# Patient Record
Sex: Female | Born: 1960
Health system: Southern US, Community
[De-identification: ages and names within clinical notes are randomized; demographics above are authoritative.]

## PROBLEM LIST (undated history)

## (undated) DIAGNOSIS — E785 Hyperlipidemia, unspecified: Secondary | ICD-10-CM

## (undated) DIAGNOSIS — Z720 Tobacco use: Secondary | ICD-10-CM

## (undated) DIAGNOSIS — I10 Essential (primary) hypertension: Secondary | ICD-10-CM

## (undated) DIAGNOSIS — M6530 Trigger finger, unspecified finger: Secondary | ICD-10-CM

## (undated) DIAGNOSIS — D649 Anemia, unspecified: Secondary | ICD-10-CM

## (undated) DIAGNOSIS — M1990 Unspecified osteoarthritis, unspecified site: Secondary | ICD-10-CM

## (undated) DIAGNOSIS — K219 Gastro-esophageal reflux disease without esophagitis: Secondary | ICD-10-CM

## (undated) DIAGNOSIS — E119 Type 2 diabetes mellitus without complications: Secondary | ICD-10-CM

## (undated) HISTORY — DX: Type 2 diabetes mellitus without complications: E11.9

## (undated) HISTORY — DX: Tobacco use: Z72.0

## (undated) HISTORY — DX: Anemia, unspecified: D64.9

## (undated) HISTORY — DX: Gastro-esophageal reflux disease without esophagitis: K21.9

## (undated) HISTORY — DX: Essential (primary) hypertension: I10

## (undated) HISTORY — DX: Hyperlipidemia, unspecified: E78.5

---

## 1993-05-14 HISTORY — PX: TUBAL LIGATION: SHX77

## 2001-05-14 HISTORY — PX: NASAL SINUS SURGERY: SHX719

## 2006-10-09 ENCOUNTER — Emergency Department: Payer: Self-pay | Admitting: Emergency Medicine

## 2006-10-10 ENCOUNTER — Emergency Department: Payer: Self-pay | Admitting: Emergency Medicine

## 2011-03-09 ENCOUNTER — Ambulatory Visit: Payer: Self-pay | Admitting: Internal Medicine

## 2011-03-30 ENCOUNTER — Ambulatory Visit (INDEPENDENT_AMBULATORY_CARE_PROVIDER_SITE_OTHER): Payer: BC Managed Care – PPO | Admitting: Internal Medicine

## 2011-03-30 ENCOUNTER — Encounter: Payer: Self-pay | Admitting: Internal Medicine

## 2011-03-30 VITALS — BP 132/70 | HR 90 | Temp 98.4°F | Resp 16 | Ht 64.0 in | Wt 235.2 lb

## 2011-03-30 DIAGNOSIS — Z79899 Other long term (current) drug therapy: Secondary | ICD-10-CM

## 2011-03-30 DIAGNOSIS — Z1211 Encounter for screening for malignant neoplasm of colon: Secondary | ICD-10-CM

## 2011-03-30 DIAGNOSIS — E785 Hyperlipidemia, unspecified: Secondary | ICD-10-CM

## 2011-03-30 DIAGNOSIS — E669 Obesity, unspecified: Secondary | ICD-10-CM

## 2011-03-30 DIAGNOSIS — Z716 Tobacco abuse counseling: Secondary | ICD-10-CM

## 2011-03-30 DIAGNOSIS — I10 Essential (primary) hypertension: Secondary | ICD-10-CM

## 2011-03-30 DIAGNOSIS — Z1239 Encounter for other screening for malignant neoplasm of breast: Secondary | ICD-10-CM

## 2011-03-30 DIAGNOSIS — Z720 Tobacco use: Secondary | ICD-10-CM | POA: Insufficient documentation

## 2011-03-30 DIAGNOSIS — Z7189 Other specified counseling: Secondary | ICD-10-CM

## 2011-03-30 LAB — BASIC METABOLIC PANEL
BUN: 18 mg/dL (ref 6–23)
CO2: 28 mEq/L (ref 19–32)
Calcium: 9.5 mg/dL (ref 8.4–10.5)
Creatinine, Ser: 0.7 mg/dL (ref 0.4–1.2)
Glucose, Bld: 170 mg/dL — ABNORMAL HIGH (ref 70–99)

## 2011-03-30 MED ORDER — LOSARTAN POTASSIUM-HCTZ 100-25 MG PO TABS: 1.0000 | ORAL_TABLET | Freq: Every day | ORAL | Status: DC

## 2011-04-01 ENCOUNTER — Encounter: Payer: Self-pay | Admitting: Internal Medicine

## 2011-04-01 DIAGNOSIS — K219 Gastro-esophageal reflux disease without esophagitis: Secondary | ICD-10-CM | POA: Insufficient documentation

## 2011-04-01 DIAGNOSIS — E785 Hyperlipidemia, unspecified: Secondary | ICD-10-CM | POA: Insufficient documentation

## 2011-04-01 DIAGNOSIS — Z716 Tobacco abuse counseling: Secondary | ICD-10-CM | POA: Insufficient documentation

## 2011-04-01 DIAGNOSIS — I10 Essential (primary) hypertension: Secondary | ICD-10-CM | POA: Insufficient documentation

## 2011-04-01 DIAGNOSIS — E1169 Type 2 diabetes mellitus with other specified complication: Secondary | ICD-10-CM | POA: Insufficient documentation

## 2011-04-01 MED ORDER — PRAVASTATIN SODIUM 40 MG PO TABS: 40.0000 mg | ORAL_TABLET | Freq: Every evening | ORAL | Status: DC

## 2011-04-01 NOTE — Assessment & Plan Note (Signed)
Discussed again,.  She has lost a brother to lung Ca.  She does not want to use pharmacotherapy at this point. Suggested the activity of drinking tea given the availabiltiy of so mnay opitons and the calming aspects of brewing and steeping tea.

## 2011-04-01 NOTE — Assessment & Plan Note (Signed)
Controlled on losartan/hct.  No changes today.  BMET due

## 2011-04-01 NOTE — Assessment & Plan Note (Addendum)
Her last LDL was 157 in April 2012. She has never had statin therapy.  If repeat LDL remains > 100 will advise statin therapy due to multiple CRFs

## 2011-04-01 NOTE — Patient Instructions (Signed)
Continue your dietar and try to add 20 minutes of exercise several times a week to help your weight loss.  If you have trouble quitting smoking without medications, return for dicussion of chantix and zyban.  We will consider statin therapy if your LDL is > 100

## 2011-04-01 NOTE — Progress Notes (Signed)
Subjective:    Patient ID: Katelyn Proctor, female    DOB: Jul 15, 1960, 50 y.o.   MRN: [id number]  HPI   Katelyn Proctor is a 50 yo AA female with a history of hypertension, hyperlipdemia, tobacco abuse and obesity who is here to followup on chronic medical issues and to discuss screening colonoscopy.  She has to date avoided screening but is now willing to undergo procedure at her younger sister's urging. She has some time off in December and would like to have it done then.  She ha no FH of colon CA, no recent change in bowel habits and no history of blood in stools.    She is compliant with medications but does not exercise regularly.  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  She has modified her diet and has lost a samll amount of weight since her obesity was addressed last April in office visit.   She continues to smoke < 1 pack daily and understands the need to stop her tobacco use but has not made a concerted effort to quit and is wondering what she will substitute for the activity to avoid the weight gain. Past Medical History  Diagnosis Date  . Hypertension   . Chronic sinusitis   . GERD (gastroesophageal reflux disease)   . Anemia   . Tobacco abuse   . Hyperlipidemia     No current outpatient prescriptions on file prior to visit.    Review of Systems  Constitutional: Negative for fever, chills and unexpected weight change.  HENT: Negative for hearing loss, ear pain, nosebleeds, congestion, sore throat, facial swelling, rhinorrhea, sneezing, mouth sores, trouble swallowing, neck pain, neck stiffness, voice change, postnasal drip, sinus pressure, tinnitus and ear discharge.   Eyes: Negative for pain, discharge, redness and visual disturbance.  Respiratory: Negative for cough, chest tightness, shortness of breath, wheezing and stridor.   Cardiovascular: Negative for chest pain, palpitations and leg swelling.  Musculoskeletal: Negative for  myalgias and arthralgias.  Skin: Negative for color change and rash.  Neurological: Negative for dizziness, weakness, light-headedness and headaches.  Hematological: Negative for adenopathy.   BP 132/70  Pulse 90  Temp(Src) 98.4 F (36.9 C) (Oral)  Resp 16  Ht 5\' 4"  (1.626 m)  Wt 235 lb 4 oz (106.709 kg)  BMI 40.38 kg/m2  SpO2 95%     Objective:   Physical Exam  Constitutional: She is oriented to person, place, and time. She appears well-developed and well-nourished.  HENT:  Mouth/Throat: Oropharynx is clear and moist.  Eyes: EOM are normal. Pupils are equal, round, and reactive to light. No scleral icterus.  Neck: Normal range of motion. Neck supple. No JVD present. No thyromegaly present.  Cardiovascular: Normal rate, regular rhythm, normal heart sounds and intact distal pulses.   Pulmonary/Chest: Effort normal and breath sounds normal.  Abdominal: Soft. Bowel sounds are normal. She exhibits no mass. There is no tenderness.  Musculoskeletal: Normal range of motion. She exhibits no edema.  Lymphadenopathy:    She has no cervical adenopathy.  Neurological: She is alert and oriented to person, place, and time.  Skin: Skin is warm and dry.  Psychiatric: She has a normal mood and affect.       Assessment & Plan:  Screening for colon CA:  will try to accomodate her short timeframe but I advised her that it is difficutl to have this accomplished in less than 3 month period. Refer to  [facility], or [facility], first available, since she is requesting [place] location.  Obesity:  Congratulated her on continued weight loss and made recommendations on diet and exercise.

## 2011-04-02 ENCOUNTER — Other Ambulatory Visit: Payer: Self-pay | Admitting: *Deleted

## 2011-04-02 NOTE — Telephone Encounter (Signed)
Opened in error

## 2011-04-26 ENCOUNTER — Other Ambulatory Visit: Payer: BC Managed Care – PPO | Admitting: Internal Medicine

## 2011-06-19 ENCOUNTER — Encounter: Payer: Self-pay | Admitting: Internal Medicine

## 2011-08-06 ENCOUNTER — Ambulatory Visit: Payer: BC Managed Care – PPO | Admitting: Internal Medicine

## 2011-08-07 ENCOUNTER — Ambulatory Visit (INDEPENDENT_AMBULATORY_CARE_PROVIDER_SITE_OTHER): Payer: BC Managed Care – PPO | Admitting: Internal Medicine

## 2011-08-07 ENCOUNTER — Encounter: Payer: Self-pay | Admitting: Internal Medicine

## 2011-08-07 VITALS — BP 142/80 | HR 92 | Temp 98.4°F | Resp 16 | Ht 64.0 in | Wt 239.0 lb

## 2011-08-07 DIAGNOSIS — Z7189 Other specified counseling: Secondary | ICD-10-CM

## 2011-08-07 DIAGNOSIS — Z1239 Encounter for other screening for malignant neoplasm of breast: Secondary | ICD-10-CM

## 2011-08-07 DIAGNOSIS — E785 Hyperlipidemia, unspecified: Secondary | ICD-10-CM

## 2011-08-07 DIAGNOSIS — I10 Essential (primary) hypertension: Secondary | ICD-10-CM

## 2011-08-07 DIAGNOSIS — Z720 Tobacco use: Secondary | ICD-10-CM

## 2011-08-07 DIAGNOSIS — F17200 Nicotine dependence, unspecified, uncomplicated: Secondary | ICD-10-CM

## 2011-08-07 DIAGNOSIS — E669 Obesity, unspecified: Secondary | ICD-10-CM

## 2011-08-07 DIAGNOSIS — Z716 Tobacco abuse counseling: Secondary | ICD-10-CM

## 2011-08-07 MED ORDER — AMLODIPINE BESYLATE 2.5 MG PO TABS: 2.5000 mg | ORAL_TABLET | Freq: Every day | ORAL | Status: DC

## 2011-08-07 NOTE — Assessment & Plan Note (Signed)
I have addressed  BMI and recommended a low glycemic index diet utilizing smaller more frequent meals to increase metabolism.  I have also recommended that patient start exercising with a goal of 30 minutes of aerobic exercise a minimum of 5 days per week. Screening for lipid disorders, thyroid and diabetes to be done today.   

## 2011-08-07 NOTE — Patient Instructions (Addendum)
Consider the Low Glycemic Index Diet and 6 smaller meals daily :   7 AM Low carbohydrate Protein  Shakes (EAS Carb Control  Or Atkins ,  Available everywhere,   In  cases at [employer]Js )  2.5 carbs  (Add or substitute a toasted sandwhich thin w/ peanut butter)  10 AM: Protein bar by Atkins (snack size,  Chocolate lover's variety at  [employer]J's)    Lunch: sandwich on pita bread or flatbread (Joseph's makes a pita bread and a flat bread , available at [employer] and [employer]J's; Toufayah makes a low carb flatbread available at [employer] and [employer])   3 PM:  Mid day :  Another protein bar,  Or a  cheese stick, 1/4 cup of almonds, walnuts, pistachios, pecans, peanuts,  Macadamia nuts  6 PM  Dinner:  "mean and green:"  Meat/chicken/fish, salad, and green veggie : use ranch, vinagrette,  Blue cheese, etc  9 PM snack : Breyer's low carb fudgiscle or  ice cream bar (Carb Smart) Weight Watcher's ice cream bar , or another protein shake  Return for 8 hr fasting labs when you can  Walk for 20 minutes 5 days per week  Adding amlodipine 2.5 mg daily for blood pressure control

## 2011-08-07 NOTE — Progress Notes (Signed)
Patient ID: Katelyn Proctor, female   DOB: 04-11-1961, 51 y.o.   MRN: [id number]  Patient Active Problem List  Diagnoses  . Tobacco abuse  . Hyperlipidemia  . Tobacco abuse counseling  . Hypertension  . GERD (gastroesophageal reflux disease)  . Obesity    Subjective:  CC:   Chief Complaint  Patient presents with  . Follow-up    HPI:   Katelyn Proctor a 51 y.o. female who   Presents for six month follow up on hypertension, hyperlpiidemia and obesity.  She was referred for colonoscopy at her last visit but this has not been done yet because she has had difficulty arranging care of her elderly un.  cle during her absence.  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 She has no new complaints today. She has been unable to lose weight despite counseling. She has reduced her tobacco consumption but has not quit yet.   6 month follow up on hypertension, hyperlpiidemia and obesity.  She was referred for colonoscopy at her last visit but this has not been done yet because she has had difficulty arranging care of her elderly un.  cle during her absence.  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 She has no new complaints today. She has been unable to lose weight despite counseling. She has reduced her tobacco consumption but has not quit yet.     Past Medical History  Diagnosis Date  . Chronic sinusitis   . Anemia   . Tobacco abuse   . Hyperlipidemia   . Hypertension   . GERD (gastroesophageal reflux disease)     Past Surgical History  Procedure Date  . Cesarean section   . Tubal ligation 1995  . Nasal sinus surgery 2003         The following portions of the patient's history were reviewed and updated as appropriate: Allergies, current medications, and problem list.    Review of Systems:   12 Pt  review of systems was negative except those addressed in the HPI,     History   Social History  . Marital Status: Single    Spouse  Name: N/A    Number of Children: N/A  . Years of Education: N/A   Occupational History  . Not on file.   Social History Main Topics  . Smoking status: Current Everyday Smoker -- 0.5 packs/day    Types: Cigarettes  . Smokeless tobacco: Never Used  . Alcohol Use: No  . Drug Use: No  . Sexually Active: Not on file   Other Topics Concern  . Not on file   Social History Narrative  . No narrative on file    Objective:  BP 142/80  Pulse 92  Temp(Src) 98.4 F (36.9 C) (Oral)  Resp 16  Ht 5\' 4"  (1.626 m)  Wt 239 lb (108.41 kg)  BMI 41.02 kg/m2  SpO2 97%  General appearance: alert, cooperative and appears stated age Ears: normal TM's and external ear canals both ears Throat: lips, mucosa, and tongue normal; teeth and gums normal Neck: no adenopathy, no carotid bruit, supple, symmetrical, trachea midline and thyroid not enlarged, symmetric, no tenderness/mass/nodules Back: symmetric, no curvature. ROM normal. No CVA tenderness. Lungs: clear to auscultation bilaterally Heart: regular rate and rhythm, S1, S2 normal, no murmur, click, rub or gallop Abdomen: soft, non-tender; bowel sounds normal; no masses,  no organomegaly Pulses: 2+ and symmetric Skin: Skin color, texture, turgor normal. No rashes  or lesions Lymph nodes: Cervical, supraclavicular, and axillary nodes normal.  Assessment and Plan:  Tobacco abuse counseling Has reduced cigs to 7 /  Daily with a smoke coach.  Can wait until the next day .  Obesity I have addressed  BMI and recommended a low glycemic index diet utilizing smaller more frequent meals to increase metabolism.  I have also recommended that patient start exercising with a goal of 30 minutes of aerobic exercise a minimum of 5 days per week. Screening for lipid disorders, thyroid and diabetes to be done today.    Tobacco abuse Counseling has been given on prior occasions and again today. She has reduced her consumption to half a pack a day. She is not  interested in pharmacotherapy at this time.  Hyperlipidemia Managed with pravastatin for goal LDL  100 or less  Hypertension Not well controlled on losartan/HCTZ alone. And adding 2.5 mg of amlodipine today.  She had normal renal function via November 2012 labs and will return for repeat labs at her convenience.    Updated Medication List Outpatient Encounter Prescriptions as of 08/07/2011  Medication Sig Dispense Refill  . losartan-hydrochlorothiazide (HYZAAR) 100-25 MG per tablet Take 1 tablet by mouth daily.  30 tablet  6  . pravastatin (PRAVACHOL) 40 MG tablet Take 1 tablet (40 mg total) by mouth every evening.  30 tablet  11  . Psyllium (METAMUCIL PO) Take by mouth.        Marland Kitchen amLODipine (NORVASC) 2.5 MG tablet Take 1 tablet (2.5 mg total) by mouth daily.  90 tablet  3  . DISCONTD: Ferrous Sulfate (IRON) 28 MG TABS Take by mouth.           Orders Placed This Encounter  Procedures  . TSH  . Lipid panel  . COMPLETE METABOLIC PANEL WITH GFR    No Follow-up on file.

## 2011-08-07 NOTE — Assessment & Plan Note (Signed)
Has reduced cigs to 7 /  Daily with a smoke coach.  Can wait until the next day .

## 2011-08-10 ENCOUNTER — Ambulatory Visit: Payer: BC Managed Care – PPO | Admitting: Internal Medicine

## 2011-08-11 ENCOUNTER — Encounter: Payer: Self-pay | Admitting: Internal Medicine

## 2011-08-11 NOTE — Assessment & Plan Note (Signed)
Managed with pravastatin for goal LDL  100 or less

## 2011-08-11 NOTE — Assessment & Plan Note (Signed)
Counseling has been given on prior occasions and again today. She has reduced her consumption to half a pack a day. She is not interested in pharmacotherapy at this time.

## 2011-08-11 NOTE — Assessment & Plan Note (Addendum)
Not well controlled on losartan/HCTZ alone. And adding 2.5 mg of amlodipine today.  She had normal renal function via November 2012 labs and will return for repeat labs at her convenience.

## 2011-08-17 ENCOUNTER — Other Ambulatory Visit (INDEPENDENT_AMBULATORY_CARE_PROVIDER_SITE_OTHER): Payer: BC Managed Care – PPO

## 2011-08-17 DIAGNOSIS — E785 Hyperlipidemia, unspecified: Secondary | ICD-10-CM

## 2011-08-17 LAB — LIPID PANEL
Cholesterol: 207 mg/dL — ABNORMAL HIGH (ref 0–200)
Triglycerides: 70 mg/dL (ref 0.0–149.0)

## 2011-08-22 ENCOUNTER — Ambulatory Visit: Payer: BC Managed Care – PPO | Admitting: Internal Medicine

## 2011-08-22 ENCOUNTER — Encounter: Payer: Self-pay | Admitting: Internal Medicine

## 2011-08-22 ENCOUNTER — Other Ambulatory Visit: Payer: Self-pay | Admitting: *Deleted

## 2011-08-22 MED ORDER — ATORVASTATIN CALCIUM 20 MG PO TABS: 20.0000 mg | ORAL_TABLET | Freq: Every day | ORAL | Status: DC

## 2011-09-19 ENCOUNTER — Encounter: Payer: Self-pay | Admitting: Internal Medicine

## 2011-10-17 ENCOUNTER — Encounter: Payer: Self-pay | Admitting: Internal Medicine

## 2011-10-17 ENCOUNTER — Telehealth: Payer: Self-pay | Admitting: Internal Medicine

## 2011-10-17 ENCOUNTER — Ambulatory Visit (INDEPENDENT_AMBULATORY_CARE_PROVIDER_SITE_OTHER): Payer: BC Managed Care – PPO | Admitting: Internal Medicine

## 2011-10-17 VITALS — BP 110/70 | HR 78 | Temp 97.8°F | Resp 16 | Wt 235.8 lb

## 2011-10-17 DIAGNOSIS — R928 Other abnormal and inconclusive findings on diagnostic imaging of breast: Secondary | ICD-10-CM

## 2011-10-17 DIAGNOSIS — E669 Obesity, unspecified: Secondary | ICD-10-CM

## 2011-10-17 DIAGNOSIS — J309 Allergic rhinitis, unspecified: Secondary | ICD-10-CM

## 2011-10-17 DIAGNOSIS — I10 Essential (primary) hypertension: Secondary | ICD-10-CM

## 2011-10-17 MED ORDER — PREDNISONE (PAK) 10 MG PO TABS: ORAL_TABLET | ORAL | Status: AC

## 2011-10-17 MED ORDER — SULFAMETHOXAZOLE-TRIMETHOPRIM 800-160 MG PO TABS: 1.0000 | ORAL_TABLET | Freq: Two times a day (BID) | ORAL | Status: AC

## 2011-10-17 NOTE — Progress Notes (Signed)
Patient ID: Katelyn Proctor, female   DOB: 30-Aug-1960, 51 y.o.   MRN: [id number]  Patient Active Problem List  Diagnoses  . Tobacco abuse  . Hyperlipidemia  . Tobacco abuse counseling  . Hypertension  . GERD (gastroesophageal reflux disease)  . Obesity  . Follow-up examination of abnormal mammogram  . Allergic rhinitis    Subjective:  CC:   Chief Complaint  Patient presents with  . Follow-up    on breast ultrasound    HPI:   Katelyn Proctor a 51 y.o. female who presents for follow up on abnormal mammogram, followed by Left axillary u/s sound several enlarged LNs.  1 month followup rec'd.  She has been having sinus congestion accompanied by ear fullness and sneezing  since early May when she started a walking program.  She has also has some mild cystic changes under both axilla c/w hidrenatis suppurativa.     Past Medical History  Diagnosis Date  . Chronic sinusitis   . Anemia   . Tobacco abuse   . Hyperlipidemia   . Hypertension   . GERD (gastroesophageal reflux disease)     Past Surgical History  Procedure Date  . Cesarean section   . Tubal ligation 1995  . Nasal sinus surgery 2003         The following portions of the patient's history were reviewed and updated as appropriate: Allergies, current medications, and problem list.    Review of Systems:   12 Pt  review of systems was negative except those addressed in the HPI,     History   Social History  . Marital Status: Single    Spouse Name: N/A    Number of Children: N/A  . Years of Education: N/A   Occupational History  . Not on file.   Social History Main Topics  . Smoking status: Current Everyday Smoker -- 0.5 packs/day    Types: Cigarettes  . Smokeless tobacco: Never Used  . Alcohol Use: No  . Drug Use: No  . Sexually Active: Not on file   Other Topics Concern  . Not on file   Social History Narrative  . No narrative on file    Objective:  BP 110/70  Pulse 78  Temp(Src) 97.8  F (36.6 C) (Oral)  Resp 16  Wt 235 lb 12 oz (106.935 kg)  SpO2 97%  General appearance: alert, cooperative and appears stated age Ears: normal TM's and external ear canals both ears Throat: lips, mucosa, and tongue normal; teeth and gums normal Neck: no adenopathy, no carotid bruit, supple, symmetrical, trachea midline and thyroid not enlarged, symmetric, no tenderness/mass/nodules Back: symmetric, no curvature. ROM normal. No CVA tenderness. Axilla: left side with subcutaneous nodule, nonfluctuant, mobile  Lungs: clear to auscultation bilaterally Heart: regular rate and rhythm, S1, S2 normal, no murmur, click, rub or gallop Abdomen: soft, non-tender; bowel sounds normal; no masses,  no organomegaly Pulses: 2+ and symmetric Skin: Skin color, texture, turgor normal. No rashes or lesions Lymph nodes: Cervical, supraclavicular, and axillary nodes normal.  Assessment and Plan:  Hypertension imporved contorll with addition of amlodipine 2.5 mg ,  dialy ,  No changes today  Follow-up examination of abnormal mammogram She has axillary LAD on the left in the area of a small  cyst c/w hidradenitis suppurativa, as well as uncontrolled allergic rhinitis.  will treat both and repeat u/s  In one month    Allergic rhinitis No signs of sinusitis on exam,.  Will treat with steroid taper and  antihistamine going forward.     Updated Medication List Outpatient Encounter Prescriptions as of 10/17/2011  Medication Sig Dispense Refill  . amLODipine (NORVASC) 2.5 MG tablet Take 1 tablet (2.5 mg total) by mouth daily.  90 tablet  3  . losartan-hydrochlorothiazide (HYZAAR) 100-25 MG per tablet Take 1 tablet by mouth daily.  30 tablet  6  . pravastatin (PRAVACHOL) 40 MG tablet Take 1 tablet (40 mg total) by mouth every evening.  30 tablet  11  . predniSONE (STERAPRED UNI-PAK) 10 MG tablet 6 tablets on Day 1 , then reduce by 1 tablet daily until gone  21 tablet  0  . sulfamethoxazole-trimethoprim (SEPTRA  DS) 800-160 MG per tablet Take 1 tablet by mouth 2 (two) times daily.  14 tablet  0  . DISCONTD: atorvastatin (LIPITOR) 20 MG tablet Take 1 tablet (20 mg total) by mouth daily.  30 tablet  3  . DISCONTD: Psyllium (METAMUCIL PO) Take by mouth.           No orders of the defined types were placed in this encounter.    No Follow-up on file.

## 2011-10-17 NOTE — Assessment & Plan Note (Signed)
imporved contorll with addition of amlodipine 2.5 mg ,  dialy ,  No changes today

## 2011-10-17 NOTE — Patient Instructions (Addendum)
I am goingn to treat you for a skin infection and for severe allergies and then have your ultrasound repeated on your left axilla in 4 weeks.   Steroid taper for get the allergies under control:  Prednisone taper over 6 days  Take either cetirizine (zyrtec) 10 mg daily  Or allegra (fexofenadine ) 180  Mg daily for allergy symptoms.   Continue using the saline to flush your sinuses.  Septra DS one tablet twice daily for one week for the skin infection

## 2011-10-17 NOTE — Telephone Encounter (Signed)
Ultra sound repeated for left axilla. Scheduled for 7.12.13 @ 3:15 [facility].Patient aware./vfw

## 2011-10-21 ENCOUNTER — Encounter: Payer: Self-pay | Admitting: Internal Medicine

## 2011-10-21 DIAGNOSIS — J309 Allergic rhinitis, unspecified: Secondary | ICD-10-CM | POA: Insufficient documentation

## 2011-10-21 NOTE — Assessment & Plan Note (Signed)
No signs of sinusitis on exam,.  Will treat with steroid taper and antihistamine going forward.

## 2011-10-21 NOTE — Assessment & Plan Note (Signed)
She has axillary LAD on the left in the area of a small  cyst c/w hidradenitis suppurativa, as well as uncontrolled allergic rhinitis.  will treat both and repeat u/s  In one month

## 2011-10-23 ENCOUNTER — Encounter: Payer: Self-pay | Admitting: Internal Medicine

## 2011-11-18 ENCOUNTER — Other Ambulatory Visit: Payer: Self-pay | Admitting: Internal Medicine

## 2011-12-04 ENCOUNTER — Encounter: Payer: Self-pay | Admitting: Internal Medicine

## 2011-12-24 ENCOUNTER — Encounter: Payer: Self-pay | Admitting: Internal Medicine

## 2012-03-20 ENCOUNTER — Encounter: Payer: Self-pay | Admitting: Internal Medicine

## 2012-04-07 ENCOUNTER — Other Ambulatory Visit: Payer: Self-pay

## 2012-04-07 MED ORDER — PRAVASTATIN SODIUM 40 MG PO TABS: 40.0000 mg | ORAL_TABLET | Freq: Every evening | ORAL | Status: DC

## 2012-04-07 NOTE — Telephone Encounter (Signed)
Refill request for Pravastatin 40 mg# 30 1 R sent electronic to [facility]. Advised patient appt needed for further refills?

## 2012-07-15 ENCOUNTER — Other Ambulatory Visit: Payer: Self-pay | Admitting: Internal Medicine

## 2012-07-16 ENCOUNTER — Telehealth: Payer: Self-pay | Admitting: Internal Medicine

## 2012-07-16 DIAGNOSIS — E785 Hyperlipidemia, unspecified: Secondary | ICD-10-CM

## 2012-07-16 DIAGNOSIS — Z79899 Other long term (current) drug therapy: Secondary | ICD-10-CM

## 2012-07-16 MED ORDER — LOSARTAN POTASSIUM-HCTZ 100-25 MG PO TABS: ORAL_TABLET | ORAL | Status: DC

## 2012-07-16 NOTE — Telephone Encounter (Signed)
Pt notified and BP meds filled for one month until labs are completed. Please advise on the muscle pain.

## 2012-07-16 NOTE — Telephone Encounter (Signed)
Please inform this patient that the reason her bp med is not being refilled is because she has not had her kidney function or electrolytes done in over a year.

## 2012-07-16 NOTE — Telephone Encounter (Signed)
Med filled for 1 month until labs are completed.

## 2012-07-16 NOTE — Telephone Encounter (Signed)
Patient notified as instructed by telephone. Lab appointment scheduled. 

## 2012-07-16 NOTE — Addendum Note (Signed)
Addended by: Jackson Latino on: 07/16/2012 12:07 PM   Modules accepted: Orders

## 2012-07-16 NOTE — Telephone Encounter (Signed)
She needs to stop the medication for cholesterol and get her labs drawn as soon as possible  so I can check to see whether it had bothered her liver

## 2012-07-16 NOTE — Telephone Encounter (Signed)
Pt made an appt for 3/21 to get these labs completed. Please advise.

## 2012-07-16 NOTE — Telephone Encounter (Signed)
Pt states she has been out of her BP meds x 2 days.  Pt states the cholesterol med she is taking is giving her muscle pain in her arms and is wondering if anything can be done to help that.  Pt did make an appt for 3/21 with Dr. Darrick Huntsman but needs help with her medications before then.  Please advise.

## 2012-07-16 NOTE — Telephone Encounter (Signed)
Please inform this patient that the reason her bp med is not being refilled is because she has not had her kidney function or electrolytes done in over a year.   

## 2012-07-21 ENCOUNTER — Other Ambulatory Visit (INDEPENDENT_AMBULATORY_CARE_PROVIDER_SITE_OTHER): Payer: BC Managed Care – PPO

## 2012-07-21 DIAGNOSIS — Z79899 Other long term (current) drug therapy: Secondary | ICD-10-CM

## 2012-07-21 DIAGNOSIS — E785 Hyperlipidemia, unspecified: Secondary | ICD-10-CM

## 2012-07-21 LAB — COMPREHENSIVE METABOLIC PANEL
CO2: 25 mEq/L (ref 19–32)
GFR: 137.14 mL/min (ref >60.00)
Glucose, Bld: 176 mg/dL — ABNORMAL HIGH (ref 70–99)
Sodium: 139 mEq/L (ref 135–145)
Total Bilirubin: 0.7 mg/dL (ref 0.3–1.2)
Total Protein: 6.4 g/dL (ref 6.0–8.3)

## 2012-07-21 LAB — LIPID PANEL: HDL: 45.2 mg/dL (ref >39.00)

## 2012-07-21 LAB — CK: Total CK: 147 U/L (ref 7–177)

## 2012-08-01 ENCOUNTER — Encounter: Payer: Self-pay | Admitting: Internal Medicine

## 2012-08-01 ENCOUNTER — Ambulatory Visit (INDEPENDENT_AMBULATORY_CARE_PROVIDER_SITE_OTHER): Payer: BC Managed Care – PPO | Admitting: Internal Medicine

## 2012-08-01 VITALS — BP 120/86 | HR 84 | Temp 97.9°F | Resp 16 | Wt 232.0 lb

## 2012-08-01 DIAGNOSIS — I10 Essential (primary) hypertension: Secondary | ICD-10-CM

## 2012-08-01 DIAGNOSIS — E669 Obesity, unspecified: Secondary | ICD-10-CM

## 2012-08-01 DIAGNOSIS — B958 Unspecified staphylococcus as the cause of diseases classified elsewhere: Secondary | ICD-10-CM

## 2012-08-01 DIAGNOSIS — E119 Type 2 diabetes mellitus without complications: Secondary | ICD-10-CM

## 2012-08-01 DIAGNOSIS — F17200 Nicotine dependence, unspecified, uncomplicated: Secondary | ICD-10-CM

## 2012-08-01 DIAGNOSIS — R739 Hyperglycemia, unspecified: Secondary | ICD-10-CM

## 2012-08-01 DIAGNOSIS — E1159 Type 2 diabetes mellitus with other circulatory complications: Secondary | ICD-10-CM | POA: Insufficient documentation

## 2012-08-01 DIAGNOSIS — R7309 Other abnormal glucose: Secondary | ICD-10-CM

## 2012-08-01 DIAGNOSIS — E785 Hyperlipidemia, unspecified: Secondary | ICD-10-CM

## 2012-08-01 DIAGNOSIS — E1169 Type 2 diabetes mellitus with other specified complication: Secondary | ICD-10-CM

## 2012-08-01 DIAGNOSIS — L089 Local infection of the skin and subcutaneous tissue, unspecified: Secondary | ICD-10-CM

## 2012-08-01 DIAGNOSIS — Z720 Tobacco use: Secondary | ICD-10-CM

## 2012-08-01 LAB — HEMOGLOBIN A1C: Hgb A1c MFr Bld: 9.8 % — ABNORMAL HIGH (ref 4.6–6.5)

## 2012-08-01 LAB — TSH: TSH: 1.03 u[IU]/mL (ref 0.35–5.50)

## 2012-08-01 MED ORDER — SULFAMETHOXAZOLE-TRIMETHOPRIM 800-160 MG PO TABS: 1.0000 | ORAL_TABLET | Freq: Two times a day (BID) | ORAL | Status: DC

## 2012-08-01 NOTE — Patient Instructions (Addendum)
You have adult onset diabetes,  We are checking hba1c today  I would like you to lose   25 lbs,  Or 10%  Of your current body weight over the next 6 months  With diet and exercise   This is  One version of a  "Low GI"  Diet:  It will still lower your blood sugars and allow you to lose 4 to 8  lbs  per month if you follow it carefully and combine it with 30 minutes of aerobic exercise 5 days per week .   All of the foods can be found at grocery stores and in bulk at [employer].  The Atkins protein bars and shakes are available in more varieties at [employer], [employer] and [employer].     7 AM Breakfast:  Choose from the following:  Low carbohydrate Protein  Shakes (I recommend the EAS AdvantEdge "Carb Control" shakes  Or the low carb shakes by Atkins.    2.5 carbs   Arnold's "Sandwhich Thin"toasted  w/ peanut butter (no jelly: about 20 net carbs  "Bagel Thin" with cream cheese and salmon: about 20 carbs   a scrambled egg/bacon/cheese burrito made with Mission's "carb balance" whole wheat tortilla  (about 10 net carbs )   Avoid cereal and bananas, oatmeal and cream of wheat and grits. They are loaded with carbohydrates!   10 AM: high protein snack  Protein bar by Atkins (the snack size, under 200 cal, usually < 6 net carbs).    A stick of cheese:  Around 1 carb,  100 cal     Dannon Light n Fit [place] Yogurt  (80 cal, 8 carbs)  Other so called "protein bars" and Greek yogurts tend to be loaded with carbohydrates.  Remember, in food advertising, the word "energy" is synonymous for " carbohydrate."  Lunch:   A Sandwich using the bread choices listed, Can use any  Eggs,  lunchmeat, grilled meat or canned tuna), avocado, regular mayo/mustard  and cheese.  A Salad using clue cheese, ranch,  Goddess or vinagrette,  No croutons or "confetti" and no "candied nuts" but regular nuts OK.   No pretzels or chips.  Pickles and miniature sweet peppers are a good low carb alternative  The bread is the only  source or carbohydrate that can be decreased (Joseph's makes a pita bread and a flat bread that are 50 cal and 4 net carbs ; Toufayan makes a low carb flatbread that's 100 cal and 9 net carbs  and  Mission's carb balance whole wheat tortilla  That is 210 cal and 6 net carbs) Avoid "Low fat dressings, as well as Reyne Dumas and [address] dressings They are loaded with sugar!   3 PM/ Mid day  Snack:  Consider  1 ounce of  almonds, walnuts, pistachios, pecans, peanuts,  Macadamia nuts or a nut medley.  Avoid "granola"; the dried cranberries and raisins are loaded with carbohydrates. Mixed nuts ok if no raisins or cranberries or dried fruit.     6 PM  Dinner:    "mean and green, "  Meat/chicken/fish with a green salad, and broccoli, cauliflower, green beans, spinach, brussel sprouts or  Lima beans::       There is a low carb pasta by Dreamfield's available at [employer] that is acceptable and tastes great only 5 diestible carbs/serving.   Try Michel Angelo's chicken piccata or chicken or eggplant parm over low carb pasta.   Clifton Custard Sanchez's "Carnitas" (pulled pork,  no sauce,  0 carbs) or his beef pot roast to make a dinner burrito  Whole wheat pasta is still full of digestible carbs and  Not as low in glycemic index as Dreamfield's.   Brown rice is still rice,  So skip the rice and noodles if you eat [place] or [place]  9 PM snack :   Breyer's "low carb" fudgsicle or  ice cream bar (Carb Smart line), or  Weight Watcher's ice cream bar , or another "no sugar added" ice cream;  a serving of fresh berries/cherries with whipped cream   Cheese or yoguty  Avoid bananas, pineapple, grapes  and watermelon on a regular basis because they are high in sugar)   Remember that snack Substitutions should be less than 10 carbs per serving and meals < 20 carbs. Remember to subtract fiber grams to get the "net carbs."

## 2012-08-01 NOTE — Assessment & Plan Note (Addendum)
She did not tolerate pravastatin use secondary to myalgias. Her untreated LDL is 170. We discussed giving her 3 months to lower her cholesterol with diet and exercise and if still elevated consider we will 100 at next visit we will consider trying low dose Lipitor or Crestor. She has been advised to start taking a baby aspirin daily.

## 2012-08-01 NOTE — Progress Notes (Signed)
Patient ID: Katelyn Proctor, female   DOB: 1960-06-21, 52 y.o.   MRN: [id number]   Patient Active Problem List  Diagnosis  . Tobacco abuse  . Hyperlipidemia  . Tobacco abuse counseling  . Hypertension  . GERD (gastroesophageal reflux disease)  . Obesity  . Follow-up examination of abnormal mammogram  . Allergic rhinitis  . Staphylococcal infection of skin  . Diabetes mellitus type 2 in obese    Subjective:  CC:   Chief Complaint  Patient presents with  . BP issues  . Cholesterol    Medication and labs    HPI:   Katelyn Proctor a 52 y.o. female who presents for Nine-month followup. Patient is overdue by 3 months for followup on hypertension, obesity, tobacco abuse and hyperlipidemia. She has had fasting labs done prior to this visit which revealed that she has  now also developed diabetes mellitus type 2.  She is not tolerating a pravastatin started at her last visit due to persistent myalgias and has stopped it. She has changed her diet a little in an effort to lose weight and is improving (more vegetables. However she is not exercising on a regular basis. Recently she developed a muscle cramp in her left posterior thigh that lasted for a full day but has now resolved. There is no history of recent travel immobilization or and no history of blood clots. She also has a new blistering lesion in her left inguinal area has been present for several days. She does not know how it began. She does not shave in that area.  Regarding her new onset diabetes her fasting glucose was 174 on her blood work. A1c is going to be drawn today. Several family members who have type 2 diabetes and is very disturbed by this news. She is very motivated to address this diagnosis with diet and exercise    Past Medical History  Diagnosis Date  . Chronic sinusitis   . Anemia   . Tobacco abuse   . Hyperlipidemia   . Hypertension   . GERD (gastroesophageal reflux disease)     Past Surgical History   Procedure Laterality Date  . Cesarean section    . Tubal ligation  1995  . Nasal sinus surgery  2003       The following portions of the patient's history were reviewed and updated as appropriate: Allergies, current medications, and problem list.    Review of Systems:  Patient denies headache, fevers, malaise, unintentional weight loss, skin rash, eye pain, sinus congestion and sinus pain, sore throat, dysphagia,  hemoptysis , cough, dyspnea, wheezing, chest pain, palpitations, orthopnea, edema, abdominal pain, nausea, melena, diarrhea, constipation, flank pain, dysuria, hematuria, urinary  Frequency, nocturia, numbness, tingling, seizures,  Focal weakness, Loss of consciousness,  Tremor, insomnia, depression, anxiety, and suicidal ideation.      History   Social History  . Marital Status: Single    Spouse Name: N/A    Number of Children: N/A  . Years of Education: N/A   Occupational History  . Not on file.   Social History Main Topics  . Smoking status: Current Every Day Smoker -- 0.50 packs/day    Types: Cigarettes  . Smokeless tobacco: Never Used  . Alcohol Use: No  . Drug Use: No  . Sexually Active: Not on file   Other Topics Concern  . Not on file   Social History Narrative  . No narrative on file    Objective:  BP 120/86  Pulse 84  Temp(Src) 97.9 F (36.6 C) (Oral)  Resp 16  Wt 232 lb (105.235 kg)  BMI 39.8 kg/m2  SpO2 97%  General appearance: alert, cooperative and appears stated age Ears: normal TM's and external ear canals both ears Throat: lips, mucosa, and tongue normal; teeth and gums normal Neck: no adenopathy, no carotid bruit, supple, symmetrical, trachea midline and thyroid not enlarged, symmetric, no tenderness/mass/nodules Back: symmetric, no curvature. ROM normal. No CVA tenderness. Lungs: clear to auscultation bilaterally Heart: regular rate and rhythm, S1, S2 normal, no murmur, click, rub or gallop Abdomen: soft, non-tender; bowel  sounds normal; no masses,  no organomegaly Pulses: 2+ and symmetric Skin: Skin color, texture, turgor normal. No rashes or lesions Lymph nodes: Cervical, supraclavicular, and axillary nodes normal.  Assessment and Plan:  Hyperlipidemia She did not tolerate pravastatin use secondary to myalgias. Her untreated LDL is 170. We discussed giving her 3 months to lower her cholesterol with diet and exercise and if still elevated consider we will 100 at next visit we will consider trying low dose Lipitor or Crestor. She has been advised to start taking a baby aspirin daily.  Tobacco abuse The risks of continued tobacco abuse in the setting of hypertension hyperlipidemia and diabetes were again outlined to patient. She is motivated to quit but does not want therapy.  Hypertension Well controlled on current regimen. Renal function stable, no changes today.  Obesity Mellitus type 2 diabetes mellitus. Low glycemic index diet discussed and handout given. Exercise recommended.  Diabetes mellitus type 2 in obese The A1c was 9.8. Metformin prescribed. She is unlikely to be able to get this under control without additional medications. I will have her purchase a glucometer and check her blood sugars twice daily for the next 2-3 weeks. If her sugars remain above 200 without medications we will start her on Lantus or Byetta.   Updated Medication List Outpatient Encounter Prescriptions as of 08/01/2012  Medication Sig Dispense Refill  . losartan-hydrochlorothiazide (HYZAAR) 100-25 MG per tablet take 1 tablet by mouth once daily  30 tablet  0  . amLODipine (NORVASC) 2.5 MG tablet Take 1 tablet (2.5 mg total) by mouth daily.  90 tablet  3  . aspirin EC 81 MG tablet Take 1 tablet (81 mg total) by mouth daily.  30 tablet  11  . glucose blood test strip Use as instructed  100 each  12  . glucose monitoring kit (FREESTYLE) monitoring kit 1 each by Does not apply route as needed for other.  1 each  1  . metFORMIN  (GLUCOPHAGE) 500 MG tablet Take 1 tablet (500 mg total) by mouth 2 (two) times daily with a meal.  180 tablet  3  . sulfamethoxazole-trimethoprim (SEPTRA DS) 800-160 MG per tablet Take 1 tablet by mouth 2 (two) times daily.  14 tablet  0  . [DISCONTINUED] pravastatin (PRAVACHOL) 40 MG tablet Take 1 tablet (40 mg total) by mouth every evening.  30 tablet  1   No facility-administered encounter medications on file as of 08/01/2012.     Orders Placed This Encounter  Procedures  . Hemoglobin A1c  . TSH    Return in about 3 months (around 11/01/2012).

## 2012-08-02 MED ORDER — GLUCOSE BLOOD VI STRP: ORAL_STRIP | Status: DC

## 2012-08-02 MED ORDER — METFORMIN HCL 500 MG PO TABS: 500.0000 mg | ORAL_TABLET | Freq: Two times a day (BID) | ORAL | Status: DC

## 2012-08-02 MED ORDER — ASPIRIN EC 81 MG PO TBEC: 81.0000 mg | DELAYED_RELEASE_TABLET | Freq: Every day | ORAL | Status: DC

## 2012-08-02 MED ORDER — FREESTYLE SYSTEM KIT: 1.0000 | PACK | Status: DC | PRN

## 2012-08-02 NOTE — Assessment & Plan Note (Signed)
Well controlled on current regimen. Renal function stable, no changes today. 

## 2012-08-02 NOTE — Assessment & Plan Note (Signed)
Mellitus type 2 diabetes mellitus. Low glycemic index diet discussed and handout given. Exercise recommended.

## 2012-08-02 NOTE — Assessment & Plan Note (Signed)
The risks of continued tobacco abuse in the setting of hypertension hyperlipidemia and diabetes were again outlined to patient. She is motivated to quit but does not want therapy.

## 2012-08-02 NOTE — Assessment & Plan Note (Signed)
The A1c was 9.8. Metformin prescribed. She is unlikely to be able to get this under control without additional medications. I will have her purchase a glucometer and check her blood sugars twice daily for the next 2-3 weeks. If her sugars remain above 200 without medications we will start her on Lantus or Byetta.

## 2012-08-04 ENCOUNTER — Encounter: Payer: Self-pay | Admitting: General Practice

## 2012-08-20 ENCOUNTER — Other Ambulatory Visit: Payer: Self-pay | Admitting: *Deleted

## 2012-08-20 MED ORDER — LOSARTAN POTASSIUM-HCTZ 100-25 MG PO TABS: ORAL_TABLET | ORAL | Status: DC

## 2012-11-07 ENCOUNTER — Ambulatory Visit (INDEPENDENT_AMBULATORY_CARE_PROVIDER_SITE_OTHER): Payer: BC Managed Care – PPO | Admitting: Internal Medicine

## 2012-11-07 ENCOUNTER — Encounter: Payer: Self-pay | Admitting: Internal Medicine

## 2012-11-07 VITALS — BP 128/78 | HR 79 | Temp 98.1°F | Resp 14 | Wt 218.5 lb

## 2012-11-07 DIAGNOSIS — E119 Type 2 diabetes mellitus without complications: Secondary | ICD-10-CM

## 2012-11-07 DIAGNOSIS — Z716 Tobacco abuse counseling: Secondary | ICD-10-CM

## 2012-11-07 DIAGNOSIS — I10 Essential (primary) hypertension: Secondary | ICD-10-CM

## 2012-11-07 DIAGNOSIS — Z7189 Other specified counseling: Secondary | ICD-10-CM

## 2012-11-07 DIAGNOSIS — E669 Obesity, unspecified: Secondary | ICD-10-CM

## 2012-11-07 DIAGNOSIS — IMO0001 Reserved for inherently not codable concepts without codable children: Secondary | ICD-10-CM

## 2012-11-07 DIAGNOSIS — E1169 Type 2 diabetes mellitus with other specified complication: Secondary | ICD-10-CM

## 2012-11-07 DIAGNOSIS — F17200 Nicotine dependence, unspecified, uncomplicated: Secondary | ICD-10-CM

## 2012-11-07 LAB — COMPREHENSIVE METABOLIC PANEL
BUN: 16 mg/dL (ref 6–23)
CO2: 30 mEq/L (ref 19–32)
Calcium: 9.9 mg/dL (ref 8.4–10.5)
Chloride: 103 mEq/L (ref 96–112)
Creatinine, Ser: 0.7 mg/dL (ref 0.4–1.2)
GFR: 118.29 mL/min (ref >60.00)
Glucose, Bld: 94 mg/dL (ref 70–99)
Total Bilirubin: 0.5 mg/dL (ref 0.3–1.2)

## 2012-11-07 LAB — HM DIABETES FOOT EXAM: HM Diabetic Foot Exam: NORMAL

## 2012-11-07 LAB — LDL CHOLESTEROL, DIRECT: Direct LDL: 160.3 mg/dL

## 2012-11-07 LAB — HEMOGLOBIN A1C: Hgb A1c MFr Bld: 6.7 % — ABNORMAL HIGH (ref 4.6–6.5)

## 2012-11-07 NOTE — Patient Instructions (Addendum)
I will refill your metformin once I see your lab results from today  Congratulations on the weight loss and exercise!  You should check you blood sugars once day at varying times, including:  Fasting 9am ,  (Before eating)  2 hours after dinner   Please e mail me the blood sugars every 2 weeks so I can help you dt the diabete under control between 3 month visits   YOU NEED TO HAVE A DILATED RETINAL EXAM EVERY YEAR BECAUSE DIABETES CAN CAUSE BLINDNESS

## 2012-11-08 ENCOUNTER — Encounter: Payer: Self-pay | Admitting: Internal Medicine

## 2012-11-08 DIAGNOSIS — E1165 Type 2 diabetes mellitus with hyperglycemia: Secondary | ICD-10-CM | POA: Insufficient documentation

## 2012-11-08 DIAGNOSIS — IMO0002 Reserved for concepts with insufficient information to code with codable children: Secondary | ICD-10-CM | POA: Insufficient documentation

## 2012-11-08 MED ORDER — POTASSIUM CHLORIDE CRYS ER 20 MEQ PO TBCR: 20.0000 meq | EXTENDED_RELEASE_TABLET | Freq: Every day | ORAL | Status: DC

## 2012-11-08 NOTE — Assessment & Plan Note (Addendum)
marked improvement with low dose metformin and dietary modifications  a1c now 6.7 .  Continue metformin.  Add asa ,  Urged to quit smoking.  Did not tolerate pravastatatin, , on [facility] .  Urged to have eyes examined. Foot exam was normal.

## 2012-11-08 NOTE — Assessment & Plan Note (Deleted)
marked improvement with low dose metformin and dietary modifications  a1c now 6.7

## 2012-11-08 NOTE — Progress Notes (Signed)
Patient ID: Katelyn Proctor, female   DOB: May 26, 1960, 52 y.o.   MRN: [id number]  Patient Active Problem List   Diagnosis Date Noted  . Staphylococcal infection of skin 08/01/2012  . Diabetes mellitus type 2 in obese 08/01/2012  . Allergic rhinitis 10/21/2011  . Follow-up examination of abnormal mammogram 10/17/2011  . Obesity 08/07/2011  . Tobacco abuse counseling 04/01/2011  . Hyperlipidemia   . Hypertension   . GERD (gastroesophageal reflux disease)   . Tobacco abuse     Subjective:  CC:   Chief Complaint  Patient presents with  . Follow-up    3 month    HPI:   Katelyn Proctor a 52 y.o. female who presents for Follow up on new onset diabetes mellitus diagnosed at last visit with A1c . 9.0.  Patient very disgruntled today.  Does not want to stay on medication for DM although she is  tolerating metformin  without problems  Has not checked blood sugars but has lost 14 lbs since last visit .   Past Medical History  Diagnosis Date  . Chronic sinusitis   . Anemia   . Tobacco abuse   . Hyperlipidemia   . Hypertension   . GERD (gastroesophageal reflux disease)   . Diabetes mellitus without complication     Past Surgical History  Procedure Laterality Date  . Cesarean section    . Tubal ligation  1995  . Nasal sinus surgery  2003       The following portions of the patient's history were reviewed and updated as appropriate: Allergies, current medications, and problem list.    Review of Systems:   12 Pt  review of systems was negative except those addressed in the HPI,     History   Social History  . Marital Status: Single    Spouse Name: N/A    Number of Children: N/A  . Years of Education: N/A   Occupational History  . Not on file.   Social History Main Topics  . Smoking status: Current Every Day Smoker -- 0.50 packs/day    Types: Cigarettes  . Smokeless tobacco: Never Used  . Alcohol Use: No  . Drug Use: No  . Sexually Active: Not on file   Other  Topics Concern  . Not on file   Social History Narrative  . No narrative on file    Objective:  BP 128/78  Pulse 79  Temp(Src) 98.1 F (36.7 C) (Oral)  Resp 14  Wt 218 lb 8 oz (99.111 kg)  BMI 37.49 kg/m2  SpO2 98%  General appearance: alert, cooperative and appears stated age Ears: normal TM's and external ear canals both ears Throat: lips, mucosa, and tongue normal; teeth and gums normal Neck: no adenopathy, no carotid bruit, supple, symmetrical, trachea midline and thyroid not enlarged, symmetric, no tenderness/mass/nodules Back: symmetric, no curvature. ROM normal. No CVA tenderness. Lungs: clear to auscultation bilaterally Heart: regular rate and rhythm, S1, S2 normal, no murmur, click, rub or gallop Abdomen: soft, non-tender; bowel sounds normal; no masses,  no organomegaly Pulses: 2+ and symmetric Skin: Skin color, texture, turgor normal. No rashes or lesions Lymph nodes: Cervical, supraclavicular, and axillary nodes normal. Foot exam:  Nails are well trimmed,  No callouses,  Sensation intact to microfilament  Assessment and Plan:  Diabetes mellitus type 2 in obese marked improvement with low dose metformin and dietary modifications  a1c now 6.7 .  Continue metformin.  Add asa ,  Urged to quit smoking.  Did not  tolerate pravastatatin, , on [facility] .  Urged to have eyes examined. Foot exam was normal.   Hypertension Well controlled on current regimen. Renal function stable, no changes today. Potassium needs replacement.    Obesity Loss of 14 lbs over last 3 months,  Patient congratulated and encouraged.   Tobacco abuse counseling Smoking cessation instruction/counseling given:  counseled patient on the dangers of tobacco use, advised patient to stop smoking, and reviewed strategies to maximize success   Updated Medication List Outpatient Encounter Prescriptions as of 11/07/2012  Medication Sig Dispense Refill  . aspirin EC 81 MG tablet Take 1 tablet (81 mg total) by  mouth daily.  30 tablet  11  . losartan-hydrochlorothiazide (HYZAAR) 100-25 MG per tablet take 1 tablet by mouth once daily  30 tablet  2  . metFORMIN (GLUCOPHAGE) 500 MG tablet Take 1 tablet (500 mg total) by mouth 2 (two) times daily with a meal.  180 tablet  3  . glucose blood test strip Use as instructed  100 each  12  . glucose monitoring kit (FREESTYLE) monitoring kit 1 each by Does not apply route as needed for other.  1 each  1  . potassium chloride SA (K-DUR,KLOR-CON) 20 MEQ tablet Take 1 tablet (20 mEq total) by mouth daily.  5 tablet  0  . sulfamethoxazole-trimethoprim (SEPTRA DS) 800-160 MG per tablet Take 1 tablet by mouth 2 (two) times daily.  14 tablet  0  . [DISCONTINUED] potassium chloride SA (K-DUR,KLOR-CON) 20 MEQ tablet Take 1 tablet (20 mEq total) by mouth daily.  30 tablet  3   No facility-administered encounter medications on file as of 11/07/2012.

## 2012-11-08 NOTE — Assessment & Plan Note (Signed)
Smoking cessation instruction/counseling given:  counseled patient on the dangers of tobacco use, advised patient to stop smoking, and reviewed strategies to maximize success 

## 2012-11-08 NOTE — Assessment & Plan Note (Addendum)
Well controlled on current regimen. Renal function stable, no changes today. Potassium needs replacement.

## 2012-11-08 NOTE — Assessment & Plan Note (Signed)
Loss of 14 lbs over last 3 months,  Patient congratulated and encouraged.

## 2012-11-09 MED ORDER — ATORVASTATIN CALCIUM 20 MG PO TABS: 20.0000 mg | ORAL_TABLET | Freq: Every day | ORAL | Status: DC

## 2012-11-09 NOTE — Addendum Note (Signed)
Addended by: Sherlene Shams on: 11/09/2012 10:32 PM   Modules accepted: Orders

## 2012-11-13 ENCOUNTER — Telehealth: Payer: Self-pay | Admitting: *Deleted

## 2012-11-13 NOTE — Telephone Encounter (Signed)
Patient states you were to fill out authorization form for Metformin when you received her labs and patient called to verify if form filled out please advise. Reference to OV 11/07/12

## 2012-11-13 NOTE — Telephone Encounter (Signed)
Dis regard note as to lab report patient is to stop metformin for 3 months.

## 2012-11-21 ENCOUNTER — Telehealth: Payer: Self-pay | Admitting: Internal Medicine

## 2012-11-21 NOTE — Telephone Encounter (Signed)
Pt needs refill on b/p meds. Pt uses [address] on [address].

## 2012-11-24 ENCOUNTER — Other Ambulatory Visit: Payer: Self-pay | Admitting: Internal Medicine

## 2012-11-24 MED ORDER — LOSARTAN POTASSIUM-HCTZ 100-25 MG PO TABS: ORAL_TABLET | ORAL | Status: DC

## 2012-11-24 NOTE — Telephone Encounter (Signed)
Refill denied, Rx was for 5 days only per result note.

## 2013-02-09 ENCOUNTER — Ambulatory Visit (INDEPENDENT_AMBULATORY_CARE_PROVIDER_SITE_OTHER): Payer: BC Managed Care – PPO | Admitting: Internal Medicine

## 2013-02-09 ENCOUNTER — Encounter: Payer: Self-pay | Admitting: Internal Medicine

## 2013-02-09 VITALS — BP 146/80 | HR 83 | Temp 98.4°F | Resp 14 | Ht 63.0 in | Wt 222.8 lb

## 2013-02-09 DIAGNOSIS — M791 Myalgia, unspecified site: Secondary | ICD-10-CM

## 2013-02-09 DIAGNOSIS — E785 Hyperlipidemia, unspecified: Secondary | ICD-10-CM

## 2013-02-09 DIAGNOSIS — IMO0001 Reserved for inherently not codable concepts without codable children: Secondary | ICD-10-CM

## 2013-02-09 DIAGNOSIS — Z23 Encounter for immunization: Secondary | ICD-10-CM

## 2013-02-09 DIAGNOSIS — E669 Obesity, unspecified: Secondary | ICD-10-CM

## 2013-02-09 DIAGNOSIS — E119 Type 2 diabetes mellitus without complications: Secondary | ICD-10-CM

## 2013-02-09 DIAGNOSIS — E1169 Type 2 diabetes mellitus with other specified complication: Secondary | ICD-10-CM

## 2013-02-09 NOTE — Assessment & Plan Note (Signed)
Repeat lipids due.  Having muscle pain daily with lipitor. Checking ck and hepatic panel. Discussed role of diet in lowering choelsterol.  She is interested in using probiotics to lower LDL as recommended by Dr. Neil Crouch.

## 2013-02-09 NOTE — Progress Notes (Signed)
Patient ID: Katelyn Proctor, female   DOB: 1961-05-04, 52 y.o.   MRN: [id number]  Patient Active Problem List   Diagnosis Date Noted  . Obesity, morbid, BMI 40.0-49.9 02/09/2013  . Staphylococcal infection of skin 08/01/2012  . Diabetes mellitus type 2 in obese 08/01/2012  . Allergic rhinitis 10/21/2011  . Follow-up examination of abnormal mammogram 10/17/2011  . Obesity 08/07/2011  . Tobacco abuse counseling 04/01/2011  . Hyperlipidemia   . Hypertension   . GERD (gastroesophageal reflux disease)   . Tobacco abuse     Subjective:  CC:   Chief Complaint  Patient presents with  . Follow-up    cholesterol/ patient does not want flu shot.    HPI:   Katelyn Proctor a 52 y.o. female who presents for 3 month follow up on DM, hypertension hyperlipidemia and obesity.  She resolved her newly diagnosed uncontrolled DM in 3 months with a reduction in hgba1c from 9.0  to  6.7 a1c in June by following a low glycemic index diet. So her metformin was discontinued.  She was started on atorvastatin for LDL > 150, and has noticed mild to moderate muscle pain, one episode so severe she needed assistance getting out of bed.  She has been walking daily for exercise. Does not check blood sugars,  Has lost 17 lbs thus far.     Past Medical History  Diagnosis Date  . Chronic sinusitis   . Anemia   . Tobacco abuse   . Hyperlipidemia   . Hypertension   . GERD (gastroesophageal reflux disease)   . Diabetes mellitus without complication     Past Surgical History  Procedure Laterality Date  . Cesarean section    . Tubal ligation  1995  . Nasal sinus surgery  2003       The following portions of the patient's history were reviewed and updated as appropriate: Allergies, current medications, and problem list.    Review of Systems:   12 Pt  review of systems was negative except those addressed in the HPI,     History   Social History  . Marital Status: Single    Spouse Name: N/A   Number of Children: N/A  . Years of Education: N/A   Occupational History  . Not on file.   Social History Main Topics  . Smoking status: Current Every Day Smoker -- 0.50 packs/day    Types: Cigarettes  . Smokeless tobacco: Never Used  . Alcohol Use: No  . Drug Use: No  . Sexual Activity: Not Currently   Other Topics Concern  . Not on file   Social History Narrative  . No narrative on file    Objective:  Filed Vitals:   02/09/13 1351  BP: 146/80  Pulse: 83  Temp: 98.4 F (36.9 C)  Resp: 14     General appearance: alert, cooperative and appears stated age Ears: normal TM's and external ear canals both ears Throat: lips, mucosa, and tongue normal; teeth and gums normal Neck: no adenopathy, no carotid bruit, supple, symmetrical, trachea midline and thyroid not enlarged, symmetric, no tenderness/mass/nodules Back: symmetric, no curvature. ROM normal. No CVA tenderness. Lungs: clear to auscultation bilaterally Heart: regular rate and rhythm, S1, S2 normal, no murmur, click, rub or gallop Abdomen: soft, non-tender; bowel sounds normal; no masses,  no organomegaly Pulses: 2+ and symmetric Skin: Skin color, texture, turgor normal. No rashes or lesions Lymph nodes: Cervical, supraclavicular, and axillary nodes normal.  Assessment and Plan:  Diabetes mellitus type  2 in obese Repeat a1c pending after stopping metformin.  Still following the low GI diet.  Foot exam normal.  On statin but not tolerating it . On ACE inhibitor.  Reminder for annual eye exam given   Hyperlipidemia Repeat lipids due.  Having muscle pain daily with lipitor. Checking ck and hepatic panel. Discussed role of diet in lowering choelsterol.  She is interested in using probiotics to lower LDL as recommended by Dr. Neil Crouch.   Obesity, morbid, BMI 40.0-49.9 I have addressed  BMI and recommended wt loss of 10% of body weigh over the next 6 months using a low glycemic index diet and regular exercise a minimum of  5 days per week.     Updated Medication List Outpatient Encounter Prescriptions as of 02/09/2013  Medication Sig Dispense Refill  . aspirin EC 81 MG tablet Take 1 tablet (81 mg total) by mouth daily.  30 tablet  11  . atorvastatin (LIPITOR) 20 MG tablet Take 1 tablet (20 mg total) by mouth daily.  90 tablet  3  . losartan-hydrochlorothiazide (HYZAAR) 100-25 MG per tablet take 1 tablet by mouth once daily  30 tablet  5  . glucose blood test strip Use as instructed  100 each  12  . glucose monitoring kit (FREESTYLE) monitoring kit 1 each by Does not apply route as needed for other.  1 each  1  . [DISCONTINUED] metFORMIN (GLUCOPHAGE) 500 MG tablet Take 1 tablet (500 mg total) by mouth 2 (two) times daily with a meal.  180 tablet  3  . [DISCONTINUED] potassium chloride SA (K-DUR,KLOR-CON) 20 MEQ tablet Take 1 tablet (20 mEq total) by mouth daily.  5 tablet  0  . [DISCONTINUED] sulfamethoxazole-trimethoprim (SEPTRA DS) 800-160 MG per tablet Take 1 tablet by mouth 2 (two) times daily.  14 tablet  0   No facility-administered encounter medications on file as of 02/09/2013.     Orders Placed This Encounter  Procedures  . Tdap vaccine greater than or equal to 7yo IM  . Pneumococcal polysaccharide vaccine 23-valent greater than or equal to 2yo subcutaneous/IM  . CK    No Follow-up on file.

## 2013-02-09 NOTE — Patient Instructions (Addendum)
If you want to stop the atorvasatin for 3 months and try Dr Shaune Pascal probiotic,  That is fine.  Return in 3 months , but please come in a day early to get your fasting labs done (full lipid panel needs to be fasting)

## 2013-02-09 NOTE — Assessment & Plan Note (Signed)
Repeat a1c pending after stopping metformin.  Still following the low GI diet.  Foot exam normal.  On statin but not tolerating it . On ACE inhibitor.  Reminder for annual eye exam given

## 2013-02-09 NOTE — Assessment & Plan Note (Signed)
I have addressed  BMI and recommended wt loss of 10% of body weigh over the next 6 months using a low glycemic index diet and regular exercise a minimum of 5 days per week.   

## 2013-05-12 ENCOUNTER — Other Ambulatory Visit (INDEPENDENT_AMBULATORY_CARE_PROVIDER_SITE_OTHER): Payer: BC Managed Care – PPO

## 2013-05-12 DIAGNOSIS — E1059 Type 1 diabetes mellitus with other circulatory complications: Secondary | ICD-10-CM

## 2013-05-12 LAB — HEMOGLOBIN A1C: Hgb A1c MFr Bld: 8 % — ABNORMAL HIGH (ref 4.6–6.5)

## 2013-05-12 LAB — COMPREHENSIVE METABOLIC PANEL
ALT: 21 U/L (ref 0–35)
Albumin: 3.7 g/dL (ref 3.5–5.2)
Alkaline Phosphatase: 89 U/L (ref 39–117)
BUN: 15 mg/dL (ref 6–23)
CO2: 29 mEq/L (ref 19–32)
Calcium: 9.2 mg/dL (ref 8.4–10.5)
Chloride: 104 mEq/L (ref 96–112)
Creatinine, Ser: 0.7 mg/dL (ref 0.4–1.2)
GFR: 114.12 mL/min (ref >60.00)
Glucose, Bld: 190 mg/dL — ABNORMAL HIGH (ref 70–99)
Potassium: 4.1 mEq/L (ref 3.5–5.1)
Total Bilirubin: 0.8 mg/dL (ref 0.3–1.2)
Total Protein: 6.9 g/dL (ref 6.0–8.3)

## 2013-05-12 LAB — LIPID PANEL
HDL: 46.7 mg/dL (ref >39.00)
LDL Cholesterol: 125 mg/dL — ABNORMAL HIGH (ref 0–99)
Total CHOL/HDL Ratio: 4
VLDL: 15.8 mg/dL (ref 0.0–40.0)

## 2013-05-15 ENCOUNTER — Ambulatory Visit (INDEPENDENT_AMBULATORY_CARE_PROVIDER_SITE_OTHER): Payer: BC Managed Care – PPO | Admitting: Internal Medicine

## 2013-05-15 ENCOUNTER — Encounter (INDEPENDENT_AMBULATORY_CARE_PROVIDER_SITE_OTHER): Payer: Self-pay

## 2013-05-15 ENCOUNTER — Encounter: Payer: Self-pay | Admitting: Internal Medicine

## 2013-05-15 ENCOUNTER — Encounter: Payer: Self-pay | Admitting: *Deleted

## 2013-05-15 VITALS — BP 140/80 | HR 75 | Temp 98.3°F | Wt 234.0 lb

## 2013-05-15 DIAGNOSIS — Z716 Tobacco abuse counseling: Secondary | ICD-10-CM

## 2013-05-15 DIAGNOSIS — E669 Obesity, unspecified: Secondary | ICD-10-CM

## 2013-05-15 DIAGNOSIS — Z7189 Other specified counseling: Secondary | ICD-10-CM

## 2013-05-15 DIAGNOSIS — E119 Type 2 diabetes mellitus without complications: Secondary | ICD-10-CM

## 2013-05-15 DIAGNOSIS — E1169 Type 2 diabetes mellitus with other specified complication: Secondary | ICD-10-CM

## 2013-05-15 DIAGNOSIS — F17200 Nicotine dependence, unspecified, uncomplicated: Secondary | ICD-10-CM

## 2013-05-15 LAB — HM DIABETES FOOT EXAM: HM Diabetic Foot Exam: NORMAL

## 2013-05-15 MED ORDER — GLUCOSE BLOOD VI STRP: ORAL_STRIP | Status: DC

## 2013-05-15 MED ORDER — METFORMIN HCL 500 MG PO TABS: 500.0000 mg | ORAL_TABLET | Freq: Two times a day (BID) | ORAL | Status: DC

## 2013-05-15 NOTE — Progress Notes (Signed)
Patient ID: Katelyn Proctor, female   DOB: 1961/02/27, 53 y.o.   MRN: [id number]   Patient Active Problem List   Diagnosis Date Noted  . Obesity, morbid, BMI 40.0-49.9 02/09/2013  . Diabetes mellitus type 2 in obese 08/01/2012  . Allergic rhinitis 10/21/2011  . Follow-up examination of abnormal mammogram 10/17/2011  . Tobacco abuse counseling 04/01/2011  . Hyperlipidemia   . Hypertension   . GERD (gastroesophageal reflux disease)   . Tobacco abuse     Subjective:  CC:   Chief Complaint  Patient presents with  . Follow-up    HPI:   Katelyn Proctor a 53 y.o. female who presents for 3 month DM follow up.  She has been eating more oatmeal for breakfast and walking occasionally for exercise but not regularly and has gained weight. No longer on medications since previous A1c was > 7.0  Spends every night with her 72 yr old uncles who is demented and lives with her.  Becomes very upset when she discusses him because she has few remaining relatives.  Still smoking, but wants to quit.  Using nicoderm patches.  Pror trial of Chantix not tolerated. Smokes 1/3 pack daily.  3 month DM follow up.  She has been eating more oatmeal for breakfast and walking occasionally for exercise but not regularly and has gained weight. No longer on medications since previous A1c was > 7.0  Spends every night with her 72 yr old uncles who is demented and lives with her.  Becomes very upset when she discusses him because she has few remaining relatives.  Still smoking, but wants to quit.  Using nicoderm patches.  Pror trial of Chantix not tolerated. Smokes 1/3 pack daily.   Past Medical History  Diagnosis Date  . Chronic sinusitis   . Anemia   . Tobacco abuse   . Hyperlipidemia   . Hypertension   . GERD (gastroesophageal reflux disease)   . Diabetes mellitus without complication     Past Surgical History  Procedure Laterality Date  . Cesarean section    . Tubal ligation  1995  . Nasal sinus surgery  2003        The following portions of the patient's history were reviewed and updated as appropriate: Allergies, current medications, and problem list.    Review of Systems:   12 Pt  review of systems was negative except those addressed in the HPI,     History   Social History  . Marital Status: Single    Spouse Name: N/A    Number of Children: N/A  . Years of Education: N/A   Occupational History  . Not on file.   Social History Main Topics  . Smoking status: Current Every Day Smoker -- 0.50 packs/day    Types: Cigarettes  . Smokeless tobacco: Never Used  . Alcohol Use: No  . Drug Use: No  . Sexual Activity: Not Currently   Other Topics Concern  . Not on file   Social History Narrative  . No narrative on file    Objective:  Filed Vitals:   05/15/13 1403  BP: 140/80  Pulse: 75  Temp: 98.3 F (36.8 C)     General appearance: alert, cooperative and appears stated age Ears: normal TM's and external ear canals both ears Throat: lips, mucosa, and tongue normal; teeth and gums normal Neck: no adenopathy, no carotid bruit, supple, symmetrical, trachea midline and thyroid not enlarged, symmetric, no tenderness/mass/nodules Back: symmetric, no curvature. ROM normal. No CVA tenderness.  Lungs: clear to auscultation bilaterally Heart: regular rate and rhythm, S1, S2 normal, no murmur, click, rub or gallop Abdomen: soft, non-tender; bowel sounds normal; no masses,  no organomegaly Pulses: 2+ and symmetric Skin: Skin color, texture, turgor normal. No rashes or lesions Lymph nodes: Cervical, supraclavicular, and axillary nodes normal. Foot exam:  Nails are well trimmed,  No callouses,  Sensation intact to microfilament   Assessment and Plan:  Diabetes mellitus type 2 in obese Resuming metformin for uncontrolled DM.  Foot exam normal.  On a statin asa and ARB. Referred for eye exam.  Spent a total of 40 minutes with patient discussing diet and exercise.  Lab Results   Component Value Date   HGBA1C 8.0* 05/12/2013   Lab Results  Component Value Date   CREATININE 0.7 05/12/2013   Lab Results  Component Value Date   MICROALBUR 1.5 11/07/2012    Obesity, morbid, BMI 40.0-49.9 I have addressed  BMI and recommended a low glycemic index diet utilizing smaller more frequent meals to increase metabolism.  I have also recommended that patient start exercising with a goal of 30 minutes of aerobic exercise a minimum of 5 days per week.   Tobacco abuse counseling Smoking cessation instruction/counseling given:  counseled patient on the dangers of tobacco use, advised patient to stop smoking, and reviewed strategies to maximize success   Updated Medication List Outpatient Encounter Prescriptions as of 05/15/2013  Medication Sig  . aspirin EC 81 MG tablet Take 1 tablet (81 mg total) by mouth daily.  Marland Kitchen atorvastatin (LIPITOR) 20 MG tablet Take 1 tablet (20 mg total) by mouth daily.  Marland Kitchen glucose blood (BAYER CONTOUR NEXT TEST) test strip Use as instructed to test blood sugar 1-2 times daily dx: 250.02  . losartan-hydrochlorothiazide (HYZAAR) 100-25 MG per tablet take 1 tablet by mouth once daily  . metFORMIN (GLUCOPHAGE) 500 MG tablet Take 1 tablet (500 mg total) by mouth 2 (two) times daily with a meal.  . [DISCONTINUED] glucose blood (BAYER CONTOUR NEXT TEST) test strip Use as instructed to test blood sugar 1-2 times daily dx: 250.02  . [DISCONTINUED] glucose blood test strip Use as instructed  . [DISCONTINUED] glucose blood test strip Use as instructed  . [DISCONTINUED] glucose blood test strip Use as instructed  . [DISCONTINUED] glucose monitoring kit (FREESTYLE) monitoring kit 1 each by Does not apply route as needed for other.     Orders Placed This Encounter  Procedures  . Ambulatory referral to Ophthalmology  . HM DIABETES FOOT EXAM    Return in about 3 months (around 08/13/2013).

## 2013-05-15 NOTE — Patient Instructions (Signed)
Your diabetes is out of control again   We are resuming the metformin to help get your diabetes under control.  Please check your sugars once a day.  Choose fasting or 2 hr post prandial (after a meal)  Keep your numbers in a diary with a brief note about what you ate.   This is  My version of a low glycemic  Index or  "Low GI"  Diet:  It will lower your blood sugars and allow you to lose 4 to 8  lbs  per month if you follow it carefully.  Your goal with exercise is a  30 minutes of aerobic exercise 5 days per week (Walking dcountsi until it becomes easy!)    All of the foods can be found at grocery stores and in bulk at [employer].  The Atkins protein bars and shakes are available in more varieties at Target, WalMart and [employer]'s Foods.     7 AM Breakfast:  Choose from the following:  Low carbohydrate Protein  Shakes (I recommend the EAS AdvantEdge "Carb Control" shakes  Or the low carb shakes by Atkins.    2.5 carbs   Arnold's "Sandwhich Thin" toasted  w/ peanut butter (no jelly: about 20 net carbs  "Bagel Thin" with cream cheese and salmon: about 20 carbs   a scrambled egg/bacon/cheese burrito made with Mission's "carb balance" whole wheat tortilla  (about 10 net carbs )   Avoid cereal and bananas, oatmeal and cream of wheat and grits. They are loaded with carbohydrates!   10 AM: high protein snack  Protein bar by Atkins (the snack size, under 200 cal, usually < 6 net carbs).    A stick of cheese:  Around 1 carb,  100 cal     Dannon Light n Fit [place] Yogurt  (80 cal, 8 carbs)  Other so called "protein bars" and Greek yogurts tend to be loaded with carbohydrates.  Remember, in food advertising, the word "energy" is synonymous for " carbohydrate."  Lunch:   A Sandwich using the bread choices listed, Can use any  Eggs,  lunchmeat, grilled meat or canned tuna), avocado, regular mayo/mustard  and cheese.  A Salad using blue cheese, ranch,  Goddess or vinagrette,  No croutons or "confetti"  and no "candied nuts" but regular nuts OK.   No pretzels or chips.  Pickles and miniature sweet peppers are a good low carb alternative that provide a "crunch"  The bread is the only source of carbohydrate in a sandwich and  can be decreased by trying some of these alternatives to traditional loaf bread  Joseph's makes a pita bread and a flat bread that are 50 cal and 4 net carbs available at BJs and WalMart.  This can be toasted to use with hummous as well  Toufayan makes a low carb flatbread that's 100 cal and 9 net carbs available at [employer] and [employer] makes 2 sizes of  Low carb whole wheat tortilla  (The large one is 210 cal and 6 net carbs) Avoid "Low fat dressings, as well as Reyne Dumas and [address] dressings They are loaded with sugar!   3 PM/ Mid day  Snack:  Consider  1 ounce of  almonds, walnuts, pistachios, pecans, peanuts,  Macadamia nuts or a nut medley.  Avoid "granola"; the dried cranberries and raisins are loaded with carbohydrates. Mixed nuts as long as there are no raisins,  cranberries or dried fruit.     6 PM  Dinner:     Meat/fowl/fish with a green salad, and either broccoli, cauliflower, green beans, spinach, brussel sprouts or  Lima beans. DO NOT BREAD THE PROTEIN!!      There is a low carb pasta by Dreamfield's that is acceptable and tastes great: only 5 digestible carbs/serving.( All grocery stores but BJs carry it )  Try Kai LevinsMichel Angelo's chicken piccata or chicken or eggplant parm over low carb pasta.(Lowes and BJs)   Clifton CustardAaron Sanchez's "Carnitas" (pulled pork, no sauce,  0 carbs) or his beef pot roast to make a dinner burrito (at BJ's)  Pesto over low carb pasta (bj's sells a good quality pesto in the center refrigerated section of the deli   Whole wheat pasta is still full of digestible carbs and  Not as low in glycemic index as Dreamfield's.   Brown rice is still rice,  So skip the rice and noodles if you eat [place] or [place] (or at least limit to 1/2  cup)  9 PM snack :   Breyer's "low carb" fudgsicle or  ice cream bar (Carb Smart line), or  Weight Watcher's ice cream bar , or another "no sugar added" ice cream;  a serving of fresh berries/cherries with whipped cream   Cheese or DANNON'S LlGHT N FIT GREEK YOGURT  Avoid bananas, pineapple, grapes  and watermelon on a regular basis because they are high in sugar.  THINK OF THEM AS DESSERT  Remember that snack Substitutions should be less than 10 NET carbs per serving and meals < 20 carbs. Remember to subtract fiber grams to get the "net carbs."

## 2013-05-15 NOTE — Progress Notes (Signed)
Pre-visit discussion using our clinic review tool. No additional management support is needed unless otherwise documented below in the visit note.  

## 2013-05-17 NOTE — Assessment & Plan Note (Addendum)
Smoking cessation instruction/counseling given:  counseled patient on the dangers of tobacco use, advised patient to stop smoking, and reviewed strategies to maximize success 

## 2013-05-17 NOTE — Assessment & Plan Note (Signed)
I have addressed  BMI and recommended a low glycemic index diet utilizing smaller more frequent meals to increase metabolism.  I have also recommended that patient start exercising with a goal of 30 minutes of aerobic exercise a minimum of 5 days per week.  

## 2013-05-17 NOTE — Assessment & Plan Note (Addendum)
Resuming metformin for uncontrolled DM.  Foot exam normal.  On a statin asa and ARB. Referred for eye exam.  Spent a total of 40 minutes with patient discussing diet and exercise.  Lab Results  Component Value Date   HGBA1C 8.0* 05/12/2013   Lab Results  Component Value Date   CREATININE 0.7 05/12/2013   Lab Results  Component Value Date   MICROALBUR 1.5 11/07/2012

## 2013-05-19 ENCOUNTER — Telehealth: Payer: Self-pay | Admitting: Internal Medicine

## 2013-05-19 NOTE — Telephone Encounter (Signed)
Patient called and states that the meter we gave her last week isn't working. She states that it keeps telling her to only use something once, and she states that she isn't trying to reuse anything. Will you please call patient and advise how to fix this error.

## 2013-05-20 NOTE — Telephone Encounter (Signed)
Returned call to patient meter working fine, patient was putting blood on strip and then inserting into glucometer which is improper advised patient how to use glucometer and patient was fine.

## 2013-06-09 ENCOUNTER — Other Ambulatory Visit: Payer: Self-pay | Admitting: Internal Medicine

## 2013-08-10 ENCOUNTER — Ambulatory Visit: Payer: BC Managed Care – PPO | Admitting: Internal Medicine

## 2013-08-24 ENCOUNTER — Telehealth: Payer: Self-pay | Admitting: *Deleted

## 2013-08-24 MED ORDER — LOSARTAN POTASSIUM-HCTZ 100-25 MG PO TABS: ORAL_TABLET | ORAL | Status: DC

## 2013-08-24 NOTE — Telephone Encounter (Signed)
Refill sent for Losartan and Hctz.

## 2013-09-08 ENCOUNTER — Ambulatory Visit: Payer: BC Managed Care – PPO | Admitting: Internal Medicine

## 2013-09-25 ENCOUNTER — Telehealth: Payer: Self-pay | Admitting: Internal Medicine

## 2013-09-25 MED ORDER — METFORMIN HCL 500 MG PO TABS: 500.0000 mg | ORAL_TABLET | Freq: Two times a day (BID) | ORAL | Status: DC

## 2013-09-25 NOTE — Telephone Encounter (Signed)
Pt called, needs metformin refill.  Mail order.

## 2013-10-02 ENCOUNTER — Ambulatory Visit: Payer: BC Managed Care – PPO | Admitting: Internal Medicine

## 2013-10-16 ENCOUNTER — Ambulatory Visit: Payer: BC Managed Care – PPO | Admitting: Internal Medicine

## 2013-11-24 ENCOUNTER — Telehealth: Payer: Self-pay | Admitting: *Deleted

## 2013-11-24 DIAGNOSIS — E119 Type 2 diabetes mellitus without complications: Secondary | ICD-10-CM

## 2013-11-24 NOTE — Telephone Encounter (Signed)
Thanks [place]y but it's just as easy to order them, so they're in.

## 2013-11-24 NOTE — Telephone Encounter (Signed)
Chart reviewed for diabetic bundle. Pt last seen 05/15/13, last labs 05/12/13. Spoke to pt and advised on need for follow up with Dr. Darrick Huntsmanullo for DM and HTN and also for fasting labs. Appt scheduled for both on 01/08/14.  Dr. Darrick Huntsmanullo- what labs would you like for her to have at her lab appt? I can enter if you would like me to.

## 2014-01-01 ENCOUNTER — Other Ambulatory Visit (INDEPENDENT_AMBULATORY_CARE_PROVIDER_SITE_OTHER): Payer: BC Managed Care – PPO

## 2014-01-01 DIAGNOSIS — E119 Type 2 diabetes mellitus without complications: Secondary | ICD-10-CM

## 2014-01-01 LAB — COMPREHENSIVE METABOLIC PANEL
ALT: 16 U/L (ref 0–35)
AST: 13 U/L (ref 0–37)
Albumin: 3.6 g/dL (ref 3.5–5.2)
Alkaline Phosphatase: 77 U/L (ref 39–117)
BUN: 17 mg/dL (ref 6–23)
CALCIUM: 9.3 mg/dL (ref 8.4–10.5)
CO2: 30 mEq/L (ref 19–32)
Chloride: 103 mEq/L (ref 96–112)
Creatinine, Ser: 0.6 mg/dL (ref 0.4–1.2)
GFR: 139.11 mL/min (ref >60.00)
Glucose, Bld: 130 mg/dL — ABNORMAL HIGH (ref 70–99)
Potassium: 3.7 mEq/L (ref 3.5–5.1)
Sodium: 139 mEq/L (ref 135–145)
Total Bilirubin: 0.7 mg/dL (ref 0.2–1.2)
Total Protein: 6.9 g/dL (ref 6.0–8.3)

## 2014-01-01 LAB — LIPID PANEL
Cholesterol: 207 mg/dL — ABNORMAL HIGH (ref 0–200)
HDL: 45 mg/dL (ref >39.00)
LDL CALC: 150 mg/dL — AB (ref 0–99)
NonHDL: 162
TRIGLYCERIDES: 61 mg/dL (ref 0.0–149.0)
Total CHOL/HDL Ratio: 5
VLDL: 12.2 mg/dL (ref 0.0–40.0)

## 2014-01-01 LAB — HEMOGLOBIN A1C: Hgb A1c MFr Bld: 7.4 % — ABNORMAL HIGH (ref 4.6–6.5)

## 2014-01-02 LAB — MICROALBUMIN / CREATININE URINE RATIO
Creatinine,U: 406.9 mg/dL
Microalb Creat Ratio: 0.5 mg/g (ref 0.0–30.0)
Microalb, Ur: 1.9 mg/dL (ref 0.0–1.9)

## 2014-01-03 MED ORDER — ATORVASTATIN CALCIUM 40 MG PO TABS: 40.0000 mg | ORAL_TABLET | Freq: Every day | ORAL | Status: DC

## 2014-01-03 NOTE — Addendum Note (Signed)
Addended by: Sherlene Shams on: 01/03/2014 09:23 AM   Modules accepted: Orders

## 2014-01-04 ENCOUNTER — Encounter: Payer: Self-pay | Admitting: *Deleted

## 2014-01-08 ENCOUNTER — Ambulatory Visit (INDEPENDENT_AMBULATORY_CARE_PROVIDER_SITE_OTHER): Payer: BC Managed Care – PPO | Admitting: Internal Medicine

## 2014-01-08 ENCOUNTER — Other Ambulatory Visit: Payer: BC Managed Care – PPO

## 2014-01-08 ENCOUNTER — Encounter: Payer: Self-pay | Admitting: Internal Medicine

## 2014-01-08 VITALS — BP 148/86 | HR 78 | Temp 98.5°F | Resp 16 | Ht 63.0 in | Wt 225.8 lb

## 2014-01-08 DIAGNOSIS — E669 Obesity, unspecified: Secondary | ICD-10-CM

## 2014-01-08 DIAGNOSIS — Z716 Tobacco abuse counseling: Secondary | ICD-10-CM

## 2014-01-08 DIAGNOSIS — E785 Hyperlipidemia, unspecified: Secondary | ICD-10-CM

## 2014-01-08 DIAGNOSIS — E119 Type 2 diabetes mellitus without complications: Secondary | ICD-10-CM

## 2014-01-08 DIAGNOSIS — Z7189 Other specified counseling: Secondary | ICD-10-CM

## 2014-01-08 DIAGNOSIS — Z888 Allergy status to other drugs, medicaments and biological substances status: Secondary | ICD-10-CM

## 2014-01-08 DIAGNOSIS — Z789 Other specified health status: Secondary | ICD-10-CM

## 2014-01-08 DIAGNOSIS — F17200 Nicotine dependence, unspecified, uncomplicated: Secondary | ICD-10-CM

## 2014-01-08 DIAGNOSIS — E1169 Type 2 diabetes mellitus with other specified complication: Secondary | ICD-10-CM

## 2014-01-08 DIAGNOSIS — I10 Essential (primary) hypertension: Secondary | ICD-10-CM

## 2014-01-08 MED ORDER — RED YEAST RICE EXTRACT 600 MG PO CAPS: 1.0000 | ORAL_CAPSULE | Freq: Two times a day (BID) | ORAL | Status: DC

## 2014-01-08 NOTE — Progress Notes (Signed)
Pre-visit discussion using our clinic review tool. No additional management support is needed unless otherwise documented below in the visit note.  

## 2014-01-08 NOTE — Progress Notes (Signed)
Patient ID: Katelyn Proctor, female   DOB: 1961-02-03, 53 y.o.   MRN: [id number]   Patient Active Problem List   Diagnosis Date Noted  . Statin intolerance 01/10/2014  . Obesity, morbid, BMI 40.0-49.9 02/09/2013  . Diabetes mellitus type 2 in obese 08/01/2012  . Allergic rhinitis 10/21/2011  . Follow-up examination of abnormal mammogram 10/17/2011  . Tobacco abuse counseling 04/01/2011  . Hyperlipidemia   . Hypertension   . GERD (gastroesophageal reflux disease)   . Tobacco abuse     Subjective:  CC:   Chief Complaint  Patient presents with  . Follow-up  . Diabetes    HPI:   Katelyn Proctor is a 53 y.o. female who presents for follow up on Type 2 DM, hyperlipidemia, and obesity. Has lost 9 lbs thus far by walking daily  During lunch break,  Has not tolerated atorvastatin due to persistent myalgias.  Blood sugars have been 130 fasting  Or less.  Does not check post prandials.  Still smoking ,  But trying to reduce her daily use.     Past Medical History  Diagnosis Date  . Chronic sinusitis   . Anemia   . Tobacco abuse   . Hyperlipidemia   . Hypertension   . GERD (gastroesophageal reflux disease)   . Diabetes mellitus without complication     Past Surgical History  Procedure Laterality Date  . Cesarean section    . Tubal ligation  1995  . Nasal sinus surgery  2003       The following portions of the patient's history were reviewed and updated as appropriate: Allergies, current medications, and problem list.    Review of Systems:   Patient denies headache, fevers, malaise, unintentional weight loss, skin rash, eye pain, sinus congestion and sinus pain, sore throat, dysphagia,  hemoptysis , cough, dyspnea, wheezing, chest pain, palpitations, orthopnea, edema, abdominal pain, nausea, melena, diarrhea, constipation, flank pain, dysuria, hematuria, urinary  Frequency, nocturia, numbness, tingling, seizures,  Focal weakness, Loss of consciousness,  Tremor, insomnia,  depression, anxiety, and suicidal ideation.     History   Social History  . Marital Status: Single    Spouse Name: N/A    Number of Children: N/A  . Years of Education: N/A   Occupational History  . Not on file.   Social History Main Topics  . Smoking status: Current Every Day Smoker -- 0.50 packs/day    Types: Cigarettes  . Smokeless tobacco: Never Used  . Alcohol Use: No  . Drug Use: No  . Sexual Activity: Not Currently   Other Topics Concern  . Not on file   Social History Narrative  . No narrative on file    Objective:  Filed Vitals:   01/08/14 1511  BP: 148/86  Pulse: 78  Temp: 98.5 F (36.9 C)  Resp: 16     General appearance: alert, cooperative and appears stated age Ears: normal TM's and external ear canals both ears Throat: lips, mucosa, and tongue normal; teeth and gums normal Neck: no adenopathy, no carotid bruit, supple, symmetrical, trachea midline and thyroid not enlarged, symmetric, no tenderness/mass/nodules Back: symmetric, no curvature. ROM normal. No CVA tenderness. Lungs: clear to auscultation bilaterally Heart: regular rate and rhythm, S1, S2 normal, no murmur, click, rub or gallop Abdomen: soft, non-tender; bowel sounds normal; no masses,  no organomegaly Pulses: 2+ and symmetric Skin: Skin color, texture, turgor normal. No rashes or lesions Lymph nodes: Cervical, supraclavicular, and axillary nodes normal.  Assessment and Plan:  Hyperlipidemia Did not tolerate pravastatin or lipitor and has stopped it as of a few weeks ago.  Recommended a trial of RYR   Hypertension Above goal on current meds.  She is on maximal dose of losartan HCT and has no proteinuria. adding amlodipine   Diabetes mellitus type 2 in obese Dm control is improving since resuming metformin for uncontrolled DM.  Foot exam normal.  Not tolerating statin .Marland Kitchen  Continue asa and ARB. Referred for eye exam.  Spent a total of 40 minutes with patient discussing diet and  exercise.  Lab Results  Component Value Date   HGBA1C 7.4* 01/01/2014   Lab Results  Component Value Date   CREATININE 0.6 01/01/2014   Lab Results  Component Value Date   MICROALBUR 1.9 01/01/2014      Statin intolerance Final trial of red yeast rice.  Intense myalgias with every trial of statin.   Obesity, morbid, BMI 40.0-49.9 She has lost 9 lbs since last visit.  I have congratulated her in reduction of   BMI and encouraged  Continued weight loss with goal of 10% of body weigh over the next 6 months using a low glycemic index diet and regular exercise a minimum of 5 days per week.    Tobacco abuse counseling Smoking cessation instruction/counseling given:  counseled patient on the dangers of tobacco use, advised patient to stop smoking, and reviewed strategies to maximize success    A total of 40 minutes was spent with patient more than half of which was spent in counseling patient on the above mentioned issues , reviewing and explaining recent labs and imaging studies done, and coordination of care.  Updated Medication List Outpatient Encounter Prescriptions as of 01/08/2014  Medication Sig  . aspirin EC 81 MG tablet Take 1 tablet (81 mg total) by mouth daily.  Marland Kitchen glucose blood (BAYER CONTOUR NEXT TEST) test strip Use as instructed to test blood sugar 1-2 times daily dx: 250.02  . losartan-hydrochlorothiazide (HYZAAR) 100-25 MG per tablet take 1 tablet by mouth once daily  . metFORMIN (GLUCOPHAGE) 500 MG tablet Take 1 tablet (500 mg total) by mouth 2 (two) times daily with a meal.  . psyllium (METAMUCIL) 0.52 G capsule Take 0.52 g by mouth daily.  Marland Kitchen amLODipine (NORVASC) 2.5 MG tablet Take 1 tablet (2.5 mg total) by mouth daily.  . [DISCONTINUED] atorvastatin (LIPITOR) 40 MG tablet Take 1 tablet (40 mg total) by mouth daily.  . [DISCONTINUED] Red Yeast Rice Extract (GNP RED YEAST RICE) 600 MG CAPS Take 1 capsule (600 mg total) by mouth 2 (two) times daily.     No orders of  the defined types were placed in this encounter.    No Follow-up on file.

## 2014-01-08 NOTE — Assessment & Plan Note (Addendum)
Did not tolerate pravastatin or lipitor and has stopped it as of a few weeks ago.  Recommended a trial of RYR

## 2014-01-08 NOTE — Patient Instructions (Addendum)
Take a break for 3 weeks from atorvastatin. . Then resume with a daily co Q 10 capsule along with the atorvastatin .  If muscle aches return ,  try the red yeast rice.  RYR is capsulated 600 mg twice daily dosing   This is  my version of a  "Low GI"  Diet:  It will still lower your blood sugars and allow you to lose 4 to 8  lbs  per month if you follow it carefully.  Your goal with exercise is a minimum of 30 minutes of aerobic exercise 5 days per week (Walking does not count once it becomes easy!)      All of the foods can be found at grocery stores and in bulk at [employer].  The Atkins protein bars and shakes are available in more varieties at Target, WalMart and [employer].     7 AM Breakfast:  Choose from the following:  Low carbohydrate Protein  Shakes (I recommend the EAS AdvantEdge "Carb Control" shakes  Or the low carb shakes by Atkins.    2.5 carbs   Arnold's "Sandwhich Thin"toasted  w/ peanut butter (no jelly: about 20 net carbs  "Bagel Thin" with cream cheese and salmon: about 20 carbs   a scrambled egg/bacon/cheese burrito made with [employer] "carb balance" whole wheat tortilla  (about 10 net carbs )  A slice of home made fritatta (egg based dish without a crust:  google it)    Avoid cereal and bananas, oatmeal and cream of wheat and grits. They are loaded with carbohydrates!   10 AM: high protein snack  Protein bar by Atkins (the snack size, under 200 cal, usually < 6 net carbs).    A stick of cheese:  Around 1 carb,  100 cal     Dannon Light n Fit [place] Yogurt  (80 cal, 8 carbs)  Other so called "protein bars" and Greek yogurts tend to be loaded with carbohydrates.  Remember, in food advertising, the word "energy" is synonymous for " carbohydrate."  Lunch:   A Sandwich using the bread choices listed, Can use any  Eggs,  lunchmeat, grilled meat or canned tuna), avocado, regular mayo/mustard  and cheese.  A Salad using blue cheese, ranch,  Goddess or vinagrette,  No croutons  or "confetti" and no "candied nuts" but regular nuts OK.   No pretzels or chips.  Pickles and miniature sweet peppers are a good low carb alternative that provide a "crunch"  The bread is the only source of carbohydrate in a sandwich and  can be decreased by trying some of these alternatives to traditional loaf bread  Joseph's makes a pita bread and a flat bread that are 50 cal and 4 net carbs available at BJs and [employer].  This can be toasted to use with hummous as well  Toufayan makes a low carb flatbread that's 100 cal and 9 net carbs available at [employer] and [employer] makes 2 sizes of  Low carb whole wheat tortilla  (The large one is 210 cal and 6 net carbs) Avoid "Low fat dressings, as well as Reyne Dumas and [address] dressings They are loaded with sugar!   3 PM/ Mid day  Snack:  Consider  1 ounce of  almonds, walnuts, pistachios, pecans, peanuts,  Macadamia nuts or a nut medley.  Avoid "granola"; the dried cranberries and raisins are loaded with carbohydrates. Mixed nuts as long as there are no raisins,  cranberries or dried fruit.  Try the prosciutto/mozzarella cheese sticks by [employer]  In deli /backery section   High protein   To avoid overindulging in snacks: Try drinking a glass of unsweeted almond/coconut milk  Or a cup of coffee with your Atkins chocolate bar t o keep you from having 3!!!        6 PM  Dinner:     Meat/fowl/fish with a green salad, and either broccoli, cauliflower, green beans, spinach, brussel sprouts or  Lima beans. DO NOT BREAD THE PROTEIN!!      There is a low carb pasta by Dreamfield's that is acceptable and tastes great: only 5 digestible carbs/serving.( All grocery stores but BJs carry it )  Try Kai Levins Angelo's chicken piccata or chicken or eggplant parm over low carb pasta.(Lowes and BJs)   Clifton Custard Sanchez's "Carnitas" (pulled pork, no sauce,  0 carbs) or his beef pot roast to make a dinner burrito (at BJ's)  Pesto over low carb pasta (bj's sells  a good quality pesto in the center refrigerated section of the deli   Try satueeing  [place] with mushroooms  Whole wheat pasta is still full of digestible carbs and  Not as low in glycemic index as Dreamfield's.   Brown rice is still rice,  So skip the rice and noodles if you eat [place] or [place] (or at least limit to 1/2 cup)  9 PM snack :   Breyer's "low carb" fudgsicle or  ice cream bar (Carb Smart line), or  Weight Watcher's ice cream bar , or another "no sugar added" ice cream;  a serving of fresh berries/cherries with whipped cream   Cheese or DANNON'S LlGHT N FIT GREEK YOGURT or the [employer] greek yogurt   8 ounces of [place] unsweetened almond/cococunut milk    Avoid bananas, pineapple, grapes  and watermelon on a regular basis because they are high in sugar.  THINK OF THEM AS DESSERT  Remember that snack Substitutions should be less than 10 NET carbs per serving and meals < 20 carbs. Remember to subtract fiber grams to get the "net carbs."

## 2014-01-10 ENCOUNTER — Encounter: Payer: Self-pay | Admitting: Internal Medicine

## 2014-01-10 DIAGNOSIS — Z789 Other specified health status: Secondary | ICD-10-CM | POA: Insufficient documentation

## 2014-01-10 MED ORDER — AMLODIPINE BESYLATE 2.5 MG PO TABS: 2.5000 mg | ORAL_TABLET | Freq: Every day | ORAL | Status: DC

## 2014-01-10 NOTE — Assessment & Plan Note (Signed)
Final trial of red yeast rice.  Intense myalgias with every trial of statin.

## 2014-01-10 NOTE — Assessment & Plan Note (Signed)
She has lost 9 lbs since last visit.  I have congratulated her in reduction of   BMI and encouraged  Continued weight loss with goal of 10% of body weigh over the next 6 months using a low glycemic index diet and regular exercise a minimum of 5 days per week.

## 2014-01-10 NOTE — Assessment & Plan Note (Signed)
Smoking cessation instruction/counseling given:  counseled patient on the dangers of tobacco use, advised patient to stop smoking, and reviewed strategies to maximize success 

## 2014-01-10 NOTE — Assessment & Plan Note (Signed)
Above goal on current meds.  She is on maximal dose of losartan HCT and has no proteinuria. adding amlodipine

## 2014-01-10 NOTE — Assessment & Plan Note (Signed)
Dm control is improving since resuming metformin for uncontrolled DM.  Foot exam normal.  Not tolerating statin .Marland Kitchen  Continue asa and ARB. Referred for eye exam.  Spent a total of 40 minutes with patient discussing diet and exercise.  Lab Results  Component Value Date   HGBA1C 7.4* 01/01/2014   Lab Results  Component Value Date   CREATININE 0.6 01/01/2014   Lab Results  Component Value Date   MICROALBUR 1.9 01/01/2014

## 2014-03-03 ENCOUNTER — Other Ambulatory Visit: Payer: Self-pay | Admitting: Internal Medicine

## 2014-04-16 ENCOUNTER — Ambulatory Visit: Payer: BC Managed Care – PPO | Admitting: Internal Medicine

## 2014-05-04 ENCOUNTER — Ambulatory Visit: Payer: BC Managed Care – PPO | Admitting: Internal Medicine

## 2014-06-04 ENCOUNTER — Ambulatory Visit: Payer: BC Managed Care – PPO | Admitting: Internal Medicine

## 2014-06-23 ENCOUNTER — Other Ambulatory Visit: Payer: Self-pay | Admitting: *Deleted

## 2014-06-23 MED ORDER — LOSARTAN POTASSIUM-HCTZ 100-25 MG PO TABS: 1.0000 | ORAL_TABLET | Freq: Every day | ORAL | Status: DC

## 2014-09-24 ENCOUNTER — Encounter: Payer: Self-pay | Admitting: Internal Medicine

## 2014-09-24 ENCOUNTER — Ambulatory Visit (INDEPENDENT_AMBULATORY_CARE_PROVIDER_SITE_OTHER): Payer: BLUE CROSS/BLUE SHIELD | Admitting: Internal Medicine

## 2014-09-24 VITALS — BP 128/84 | HR 82 | Resp 14 | Ht 63.0 in | Wt 230.5 lb

## 2014-09-24 DIAGNOSIS — E669 Obesity, unspecified: Secondary | ICD-10-CM | POA: Diagnosis not present

## 2014-09-24 DIAGNOSIS — E119 Type 2 diabetes mellitus without complications: Secondary | ICD-10-CM

## 2014-09-24 DIAGNOSIS — Z1239 Encounter for other screening for malignant neoplasm of breast: Secondary | ICD-10-CM | POA: Diagnosis not present

## 2014-09-24 DIAGNOSIS — Z720 Tobacco use: Secondary | ICD-10-CM

## 2014-09-24 DIAGNOSIS — I10 Essential (primary) hypertension: Secondary | ICD-10-CM | POA: Diagnosis not present

## 2014-09-24 DIAGNOSIS — IMO0002 Reserved for concepts with insufficient information to code with codable children: Secondary | ICD-10-CM

## 2014-09-24 DIAGNOSIS — E1165 Type 2 diabetes mellitus with hyperglycemia: Secondary | ICD-10-CM

## 2014-09-24 DIAGNOSIS — Z716 Tobacco abuse counseling: Secondary | ICD-10-CM | POA: Diagnosis not present

## 2014-09-24 DIAGNOSIS — E785 Hyperlipidemia, unspecified: Secondary | ICD-10-CM

## 2014-09-24 DIAGNOSIS — E1169 Type 2 diabetes mellitus with other specified complication: Secondary | ICD-10-CM

## 2014-09-24 LAB — LIPID PANEL
CHOLESTEROL: 231 mg/dL — AB (ref 0–200)
HDL: 44.9 mg/dL (ref >39.00)
LDL CALC: 169 mg/dL — AB (ref 0–99)
NonHDL: 186.1
TRIGLYCERIDES: 85 mg/dL (ref 0.0–149.0)
Total CHOL/HDL Ratio: 5
VLDL: 17 mg/dL (ref 0.0–40.0)

## 2014-09-24 LAB — COMPREHENSIVE METABOLIC PANEL
ALBUMIN: 3.7 g/dL (ref 3.5–5.2)
ALK PHOS: 100 U/L (ref 39–117)
ALT: 13 U/L (ref 0–35)
AST: 11 U/L (ref 0–37)
BILIRUBIN TOTAL: 0.3 mg/dL (ref 0.2–1.2)
BUN: 13 mg/dL (ref 6–23)
CO2: 29 mEq/L (ref 19–32)
CREATININE: 0.64 mg/dL (ref 0.40–1.20)
Calcium: 9.5 mg/dL (ref 8.4–10.5)
Chloride: 102 mEq/L (ref 96–112)
GFR: 123.84 mL/min (ref >60.00)
Glucose, Bld: 200 mg/dL — ABNORMAL HIGH (ref 70–99)
Potassium: 4.1 mEq/L (ref 3.5–5.1)
SODIUM: 136 meq/L (ref 135–145)
Total Protein: 6.7 g/dL (ref 6.0–8.3)

## 2014-09-24 LAB — HEMOGLOBIN A1C: Hgb A1c MFr Bld: 9.5 % — ABNORMAL HIGH (ref 4.6–6.5)

## 2014-09-24 LAB — MICROALBUMIN / CREATININE URINE RATIO
Creatinine,U: 260.9 mg/dL
Microalb Creat Ratio: 3.6 mg/g (ref 0.0–30.0)
Microalb, Ur: 9.5 mg/dL — ABNORMAL HIGH (ref 0.0–1.9)

## 2014-09-24 NOTE — Progress Notes (Signed)
Patient ID: Katelyn SiGlenda D Mullarkey, female   DOB: 1960-08-05, 54 y.o.   MRN: [id number]  Patient Active Problem List   Diagnosis Date Noted  . Statin intolerance 01/10/2014  . Obesity, morbid, BMI 40.0-49.9 02/09/2013  . Type II diabetes mellitus, uncontrolled 08/01/2012  . Allergic rhinitis 10/21/2011  . Follow-up examination of abnormal mammogram 10/17/2011  . Tobacco abuse counseling 04/01/2011  . Hyperlipidemia   . Hypertension   . GERD (gastroesophageal reflux disease)   . Tobacco abuse     Subjective:  CC:   Chief Complaint  Patient presents with  . Follow-up    DM follow up    HPI:   Katelyn Proctor is a 54 y.o. female who presents for follow up on Type 2 DM, hypertension, hyperlipidemia, obesity and tobacco abuse. .  She has been lost to follow up and was last seen August 2015.  Her uncle has been ill with frequent sycnope and freuqent UTIs.    Lab Results  Component Value Date   HGBA1C 7.4* 01/01/2014    Wt Readings from Last 3 Encounters:  09/24/14 230 lb 8 oz (104.554 kg)  01/08/14 225 lb 12 oz (102.4 kg)  05/15/13 234 lb (106.142 kg)   She has not been checking her blood sugars.  She has modified her diet by eating more salads  She has been having symptoms of GERD esophagitis if she eats and lies down .  Diet discussed.   She has a lot of food intolerances she blames on altered sense of smell due to chronic sinus congestion.    Past Medical History  Diagnosis Date  . Chronic sinusitis   . Anemia   . Tobacco abuse   . Hyperlipidemia   . Hypertension   . GERD (gastroesophageal reflux disease)   . Diabetes mellitus without complication     Past Surgical History  Procedure Laterality Date  . Cesarean section    . Tubal ligation  1995  . Nasal sinus surgery  2003       The following portions of the patient's history were reviewed and updated as appropriate: Allergies, current medications, and problem list.    Review of Systems:   Patient denies  headache, fevers, malaise, unintentional weight loss, skin rash, eye pain, sinus congestion and sinus pain, sore throat, dysphagia,  hemoptysis , cough, dyspnea, wheezing, chest pain, palpitations, orthopnea, edema, abdominal pain, nausea, melena, diarrhea, constipation, flank pain, dysuria, hematuria, urinary  Frequency, nocturia, numbness, tingling, seizures,  Focal weakness, Loss of consciousness,  Tremor, insomnia, depression, anxiety, and suicidal ideation.     History   Social History  . Marital Status: Single    Spouse Name: N/A  . Number of Children: N/A  . Years of Education: N/A   Occupational History  . Not on file.   Social History Main Topics  . Smoking status: Current Every Day Smoker -- 0.50 packs/day    Types: Cigarettes  . Smokeless tobacco: Never Used  . Alcohol Use: No  . Drug Use: No  . Sexual Activity: Not Currently   Other Topics Concern  . Not on file   Social History Narrative    Objective:  Filed Vitals:   09/24/14 1439  BP: 128/84  Pulse: 82  Resp: 14     General appearance: alert, cooperative and appears stated age Ears: normal TM's and external ear canals both ears Throat: lips, mucosa, and tongue normal; teeth and gums normal Neck: no adenopathy, no carotid bruit, supple, symmetrical, trachea midline  and thyroid not enlarged, symmetric, no tenderness/mass/nodules Back: symmetric, no curvature. ROM normal. No CVA tenderness. Lungs: clear to auscultation bilaterally Heart: regular rate and rhythm, S1, S2 normal, no murmur, click, rub or gallop Abdomen: soft, non-tender; bowel sounds normal; no masses,  no organomegaly Pulses: 2+ and symmetric Skin: Skin color, texture, turgor normal. No rashes or lesions Lymph nodes: Cervical, supraclavicular, and axillary nodes normal.  Assessment and Plan:  Tobacco abuse counseling sometimes smokes only 5 daily    Type II diabetes mellitus, uncontrolled Uncontrolled by current A1c. .  Will increase  the metformin to 1000 mg bid and add glipizide 5 mg bid.  Will have her return in one month.   Lab Results  Component Value Date   HGBA1C 9.5* 09/24/2014      A total of 25 minutes of face to face time was spent with patient more than half of which was spent in counselling about the above mentioned conditions  and coordination of care  Updated Medication List Outpatient Encounter Prescriptions as of 09/24/2014  Medication Sig  . aspirin EC 81 MG tablet Take 1 tablet (81 mg total) by mouth daily.  Marland Kitchen. glucose blood (BAYER CONTOUR NEXT TEST) test strip Use as instructed to test blood sugar 1-2 times daily dx: 250.02  . losartan-hydrochlorothiazide (HYZAAR) 100-25 MG per tablet Take 1 tablet by mouth daily.  . metFORMIN (GLUCOPHAGE) 500 MG tablet Take 1 tablet (500 mg total) by mouth 2 (two) times daily with a meal.  . psyllium (METAMUCIL) 0.52 G capsule Take 0.52 g by mouth daily.  . Vitamin D, Cholecalciferol, 1000 UNITS TABS Take 1 tablet by mouth daily.  Marland Kitchen. amLODipine (NORVASC) 2.5 MG tablet Take 1 tablet (2.5 mg total) by mouth daily. (Patient not taking: Reported on 09/24/2014)  . glipiZIDE (GLUCOTROL) 5 MG tablet Take 1 tablet (5 mg total) by mouth 2 (two) times daily before a meal.  . [DISCONTINUED] amLODipine (NORVASC) 2.5 MG tablet Take 1 tablet (2.5 mg total) by mouth daily.   No facility-administered encounter medications on file as of 09/24/2014.     Orders Placed This Encounter  Procedures  . MM DIGITAL SCREENING BILATERAL  . Comprehensive metabolic panel  . Hemoglobin A1c  . Microalbumin / creatinine urine ratio  . Lipid panel  . TSH  . Comprehensive metabolic panel  . Lipid panel    Return in about 3 months (around 12/25/2014) for follow up diabetes.

## 2014-09-24 NOTE — Assessment & Plan Note (Addendum)
The patient was counseled on the dangers of tobacco use, and was advised to quit.  Reviewed strategies to maximize success, including removing cigarettes and smoking materials from environment. 

## 2014-09-24 NOTE — Progress Notes (Signed)
Pre visit review using our clinic review tool, if applicable. No additional management support is needed unless otherwise documented below in the visit note. 

## 2014-09-24 NOTE — Patient Instructions (Addendum)
I recommend getting the majority of your calcium and Vitamin D  through diet rather than supplements given the recent association of calcium supplements with increased coronary artery calcium scores  Try the Silk almond Light,  Original formula.  Delicious,  Low carb,  Low cal,  Cholesterol free   Drink G2 for your electrolytes  ( it has less sugar ,  All the lytes you need )   Start an exercise  Program   Goal 30 minutes 5  Times per week huffin and puffin!!!     Gastroesophageal Reflux Disease, Adult Gastroesophageal reflux disease (GERD) happens when acid from your stomach flows up into the esophagus. When acid comes in contact with the esophagus, the acid causes soreness (inflammation) in the esophagus. Over time, GERD may create small holes (ulcers) in the lining of the esophagus. CAUSES   Increased body weight. This puts pressure on the stomach, making acid rise from the stomach into the esophagus.  Smoking. This increases acid production in the stomach.  Drinking alcohol. This causes decreased pressure in the lower esophageal sphincter (valve or ring of muscle between the esophagus and stomach), allowing acid from the stomach into the esophagus.  Late evening meals and a full stomach. This increases pressure and acid production in the stomach.  A malformed lower esophageal sphincter. Sometimes, no cause is found. SYMPTOMS   Burning pain in the lower part of the mid-chest behind the breastbone and in the mid-stomach area. This may occur twice a week or more often.  Trouble swallowing.  Sore throat.  Dry cough.  Asthma-like symptoms including chest tightness, shortness of breath, or wheezing. DIAGNOSIS  Your caregiver may be able to diagnose GERD based on your symptoms. In some cases, X-rays and other tests may be done to check for complications or to check the condition of your stomach and esophagus. TREATMENT  Your caregiver may recommend over-the-counter or prescription  medicines to help decrease acid production. Ask your caregiver before starting or adding any new medicines.  HOME CARE INSTRUCTIONS   Change the factors that you can control. Ask your caregiver for guidance concerning weight loss, quitting smoking, and alcohol consumption.  Avoid foods and drinks that make your symptoms worse, such as:  Caffeine or alcoholic drinks.  Chocolate.  Peppermint or mint flavorings.  Garlic and onions.  Spicy foods.  Citrus fruits, such as oranges, lemons, or limes.  Tomato-based foods such as sauce, chili, salsa, and pizza.  Fried and fatty foods.  Avoid lying down for the 3 hours prior to your bedtime or prior to taking a nap.  Eat small, frequent meals instead of large meals.  Wear loose-fitting clothing. Do not wear anything tight around your waist that causes pressure on your stomach.  Raise the head of your bed 6 to 8 inches with wood blocks to help you sleep. Extra pillows will not help.  Only take over-the-counter or prescription medicines for pain, discomfort, or fever as directed by your caregiver.  Do not take aspirin, ibuprofen, or other nonsteroidal anti-inflammatory drugs (NSAIDs). SEEK IMMEDIATE MEDICAL CARE IF:   You have pain in your arms, neck, jaw, teeth, or back.  Your pain increases or changes in intensity or duration.  You develop nausea, vomiting, or sweating (diaphoresis).  You develop shortness of breath, or you faint.  Your vomit is green, yellow, black, or looks like coffee grounds or blood.  Your stool is red, bloody, or black. These symptoms could be signs of other problems, such as  heart disease, gastric bleeding, or esophageal bleeding. MAKE SURE YOU:   Understand these instructions.  Will watch your condition.  Will get help right away if you are not doing well or get worse. Document Released: 02/07/2005 Document Revised: 07/23/2011 Document Reviewed: 11/17/2010 [facility] Patient Information 2015  [employer], [place]. This information is not intended to replace advice given to you by your health care provider. Make sure you discuss any questions you have with your health care provider.

## 2014-09-26 ENCOUNTER — Encounter: Payer: Self-pay | Admitting: Internal Medicine

## 2014-09-26 MED ORDER — GLIPIZIDE 5 MG PO TABS: 5.0000 mg | ORAL_TABLET | Freq: Two times a day (BID) | ORAL | Status: DC

## 2014-09-26 NOTE — Assessment & Plan Note (Addendum)
She did  not tolerate pravastatin or lipitor, but she has not been taking it. Untreated lipid panel is notable for LDL of 169  Lab Results  Component Value Date   CHOL 231* 09/24/2014   HDL 44.90 09/24/2014   LDLCALC 169* 09/24/2014   LDLDIRECT 160.3 11/07/2012   TRIG 85.0 09/24/2014   CHOLHDL 5 09/24/2014       

## 2014-09-26 NOTE — Assessment & Plan Note (Signed)
I have addressed  BMI and recommended a low glycemic index diet utilizing smaller more frequent meals to increase metabolism.  I have also recommended that patient start exercising with a goal of 30 minutes of aerobic exercise a minimum of 5 days per week.  

## 2014-09-26 NOTE — Assessment & Plan Note (Signed)
BP 128/84 mmHg  Pulse 82  Resp 14  Ht 5\' 3"  (1.6 m)  Wt 230 lb 8 oz (104.554 kg)  BMI 40.84 kg/m2  SpO2 97%  Well controlled on current regimen. Renal function stable, no changes today.  Lab Results  Component Value Date   CREATININE 0.64 09/24/2014   Lab Results  Component Value Date   NA 136 09/24/2014   K 4.1 09/24/2014   CL 102 09/24/2014   CO2 29 09/24/2014

## 2014-09-26 NOTE — Assessment & Plan Note (Signed)
She has reduced her use to less than half pack daily.

## 2014-09-26 NOTE — Assessment & Plan Note (Signed)
Uncontrolled by current A1c. .  Will increase the metformin to 1000 mg bid and add glipizide 5 mg bid.  Will have her return in one month.   Lab Results  Component Value Date   HGBA1C 9.5* 09/24/2014

## 2014-11-05 ENCOUNTER — Other Ambulatory Visit (HOSPITAL_COMMUNITY)
Admission: RE | Admit: 2014-11-05 | Discharge: 2014-11-05 | Disposition: A | Discharge status: Home / Self Care | Payer: BLUE CROSS/BLUE SHIELD | Source: Ambulatory Visit | Attending: Internal Medicine | Admitting: Internal Medicine

## 2014-11-05 ENCOUNTER — Encounter: Payer: Self-pay | Admitting: Internal Medicine

## 2014-11-05 ENCOUNTER — Ambulatory Visit (INDEPENDENT_AMBULATORY_CARE_PROVIDER_SITE_OTHER): Payer: BLUE CROSS/BLUE SHIELD | Admitting: Internal Medicine

## 2014-11-05 VITALS — BP 122/80 | HR 74 | Temp 97.6°F | Resp 16 | Ht 63.0 in | Wt 225.5 lb

## 2014-11-05 DIAGNOSIS — E785 Hyperlipidemia, unspecified: Secondary | ICD-10-CM

## 2014-11-05 DIAGNOSIS — Z1151 Encounter for screening for human papillomavirus (HPV): Secondary | ICD-10-CM | POA: Diagnosis present

## 2014-11-05 DIAGNOSIS — Z01419 Encounter for gynecological examination (general) (routine) without abnormal findings: Secondary | ICD-10-CM | POA: Diagnosis present

## 2014-11-05 DIAGNOSIS — IMO0002 Reserved for concepts with insufficient information to code with codable children: Secondary | ICD-10-CM

## 2014-11-05 DIAGNOSIS — E1165 Type 2 diabetes mellitus with hyperglycemia: Secondary | ICD-10-CM | POA: Diagnosis not present

## 2014-11-05 DIAGNOSIS — Z1239 Encounter for other screening for malignant neoplasm of breast: Secondary | ICD-10-CM | POA: Diagnosis not present

## 2014-11-05 DIAGNOSIS — Z716 Tobacco abuse counseling: Secondary | ICD-10-CM | POA: Diagnosis not present

## 2014-11-05 DIAGNOSIS — Z0000 Encounter for general adult medical examination without abnormal findings: Secondary | ICD-10-CM | POA: Diagnosis not present

## 2014-11-05 DIAGNOSIS — Z124 Encounter for screening for malignant neoplasm of cervix: Secondary | ICD-10-CM | POA: Diagnosis not present

## 2014-11-05 NOTE — Patient Instructions (Signed)
I want you to check your sugars twice daily  At various times:  1) before breakfast and 2 hours after breakfast  2) before bedtime and the following morning when you wake up before you have eaten  3) before your biggest meal and 2 hours afterward  Your best breakfast choice for low sugar is the premixed protein shake.  There are 3 acceptable choices:  EAS AdvantageEdge Carb Control Atkins [employer] Protein  All are sold by [employer] in 4 packs and various flavors   This is  my version of a  "Low GI"  Weight loss Diet:  It has enabled many of my patients to lose up to 10 lbs per month depending on how much you restrict your carbs.   follow it carefully.  The idea behind low carb diets is that your body has to take the fat and protein in your food and turn it into an energy that is less efficient than carbohydrates, so you lose weight.    I have listed several choices at each of your 6 meal times to make it easy to shop.and plan    Remember that snack Substitutions should be equal to or less than 5 NET carbs per serving and  The only carbs in meals come from the pasta or vegetables so, they should be < 15 net carbs. Remember that carbohydrates from fiber do not count, so you can  subtract fiber grams to get the "net carbs " of any particular food item.    All of the foods can be found at grocery stores and in bulk at [employer].  The Atkins protein bars and shakes are available in more varieties at Target, WalMart and [employer] Foods.     7 AM Breakfast:  Choose from the following:  Low carbohydrate Protein  Shakes (I recommend the EAS AdvantEdge "Carb Control" shakes, Atkins,  Muscle Milk or Premier Protein shakes  All are < 4  carbs . My favorite is the premier protein chocolate   a scrambled egg/bacon/cheese burrito made with Mission's "carb balance" whole wheat tortilla  (this particular tortilla has only 6 net carbs, once you subtract the fiber grams  )  A slice of home made  fritatta (egg based dish without a crust:  google it)    Avoid cereal and bananas, oatmeal and cream of wheat and grits. They are loaded with carbohydrates and have too few fiber grams    10 AM: high protein snack  Protein bar by Atkins  Or KIND  (the lw sugar variety,  Not all are low , under 200 cal, usually < 6 net carbs).    A stick of cheese:  Around 1 carb,  100 cal      Other so called "protein bars" and Greek yogurts tend to be loaded with carbohydrates.  Remember, in food advertising, the word "energy" is synonymous for " carbohydrate."  Lunch:   A Sandwich using the bread choices listed below,  You  Can use any  Eggs,  lunchmeat, grilled meat or canned tuna), avocado, regular mayo/mustard  and cheese.  A Salad using blue cheese, ranch,  Goddess or vinagrette,  No croutons or "confetti" and no "candied nuts" but regular nuts OK. No "fat free": they add sugar for taste  No pretzels or chips.  Pickles and miniature sweet peppers are a good low carb alternative that provide a "crunch"   Please Note:  The bread is the only source of carbohydrate  in a sandwich and  can be decreased by trying some of these alternatives to traditional loaf bread. Karin Golden has the best selection:  Joseph's makes a pita bread and a flat ("Lavash")  bread that are 50 cal and 4 net carbs and are available at BJs and WalMart.  These can be toasted to make them taste better,  And can be  used as a pita chip to use with hummous as well  Toufayan makes a low carb flatbread that's 100 cal and 9 net carbs available at [employer] and [employer] makes 2 sizes of  Low carb whole wheat tortilla  (The large one is 210 cal and 6 net carbs, the small is 120 cal and also 6 net carbs)  Flat Out makes flatbreads that are low carb as well . They're called "Fold Its")   Avoid "Low fat dressings, as well as Reyne Dumas and [address] dressings They are loaded with sugar!   3 PM/ Mid day  Snack:  Consider  1 ounce of   almonds, walnuts, pistachios, pecans, peanuts,  Macadamia nuts or a nut medley are ok .  Avoid "granola"; the dried cranberries and raisins are loaded with carbohydrates. Mixed nuts as long as there are no raisins,  cranberries or dried fruit.    Try the prosciutto/mozzarella cheese sticks by [employer]  In deli /backery section   High protein 80 cal each , 1 carb   To avoid overindulging in snacks: Try drinking a glass of unsweeted almond/coconut milk  Or a cup of coffee with your Atkins chocolate bar to keep you from having 3!!!   Pork rinds!  Yes Pork Rinds  Are on the diet .  They are your potato chip.        6 PM  Dinner:     Meat/fowl/fish with a green salad, with steamed/grilled/sauteed vegggie: broccoli, cauliflower, green beans, spinach, brussel sprouts or  Lima beans. DO NOT BREAD THE PROTEIN!!      There is a low carb pasta by Dreamfield's that is acceptable and tastes great: only 5 digestible carbs/serving.( All grocery stores but BJs carry it )  Try Kai Levins Angelo's chicken piccata or chicken or eggplant parm over low carb pasta.(Lowes and BJs)   Clifton Custard Sanchez's "Carnitas" (pulled pork, no sauce,  0 carbs) can be used to make a dinner  burrito (available at BJ's ) and his pot roast is also without veggies or potatoes   Pesto over low carb pasta (bj's sells a good quality pesto in the center refrigerated section of the deli   Try satueeing  Roosvelt Harps with mushrooms as another side dish   Whole wheat pasta is still full of digestible carbs and  Not as low in glycemic index as Dreamfield's.   Brown rice is still rice,  So skip the rice and noodles if you eat [place] or [place] (or at least limit to 1 cooked cup)  9 PM snack :   Breyer's "low carb" fudgsicle or  ice cream bar (Carb Smart line), or  Weight Watcher's ice cream bar , or another "no sugar added" ice cream;  a serving of fresh berries/cherries with whipped cream   Cheese or DANNON[employer]S LlG[employer] GREEK YOGURT or the [place] greek  yogurt   8 ounces of [place] unsweetened almond/cococunut milk  Cheese and crackers (using WASA crackers,  They are low carb) or peanut butter on low carb crackers or pita bread     Avoid  bananas, pineapple, grapes  and watermelon on a regular basis because they are high in sugar.  THINK OF THEM AS DESSERT. Stick to" berries and cherries"

## 2014-11-05 NOTE — Progress Notes (Signed)
Subjective

## 2014-11-08 DIAGNOSIS — Z0000 Encounter for general adult medical examination without abnormal findings: Secondary | ICD-10-CM | POA: Insufficient documentation

## 2014-11-08 NOTE — Assessment & Plan Note (Signed)

## 2014-11-08 NOTE — Progress Notes (Signed)
Patient ID: Katelyn Proctor, female    DOB: 1960/05/21  Age: 54 y.o. MRN: [id number]  The patient is here for   Her annual exam with PAP .  Her last  PAP  Was normal in 2011,  And she is not sexually active.  Her las mammogram was done in 2013.    Risk factors are reflected  in the social history.  The roster of all physicians providing medical care to patient - is listed in the Snapshot section of the chart.  Activities of daily living:  The patient is 100% independent in all ADLs: dressing, toileting, feeding as well as independent mobility  Home safety : The patient has smoke detectors in the home. They wear seatbelts.  There are no firearms at home. There is no violence in the home.   There is no risks for hepatitis, STDs or HIV. There is no   history of blood transfusion. They have no travel history to infectious disease endemic areas of the world.  The patient has seen their dentist in the last six month. They have not  seen their eye doctor in the last year. They admit to slight hearing difficulty with regard to whispered voices and some television programs.  They have deferred audiologic testing in the last year.  They do not  have excessive sun exposure. Discussed the need for sun protection: hats, long sleeves and use of sunscreen if there is significant sun exposure.   Diet: the importance of a healthy diet is discussed. They do have a healthy diet.  The benefits of regular aerobic exercise were discussed. She walks 4 times per week ,  20 minutes.   Depression screen: there are no signs or vegative symptoms of depression- irritability, change in appetite, anhedonia, sadness/tearfullness.  Cognitive assessment: the patient manages all their financial and personal affairs and is actively engaged. They could relate day,date,year and events; recalled 2/3 objects at 3 minutes; performed clock-face test normally.  The following portions of the patient's history were reviewed and updated as  appropriate: allergies, current medications, past family history, past medical history,  past surgical history, past social history  and problem list.  Visual acuity was not assessed per patient preference since she has regular follow up with her ophthalmologist. Hearing and body mass index were assessed and reviewed.   During the course of the visit the patient was educated and counseled about appropriate screening and preventive services including : fall prevention , diabetes screening, nutrition counseling, colorectal cancer screening, and recommended immunizations.    CC: The primary encounter diagnosis was Pap smear for cervical cancer screening. Diagnoses of Breast cancer screening, Encounter for preventive health examination, Hyperlipidemia, Tobacco abuse counseling, and Type II diabetes mellitus, uncontrolled were also pertinent to this visit.  History Yazaira has a past medical history of Chronic sinusitis; Anemia; Tobacco abuse; Hyperlipidemia; Hypertension; GERD (gastroesophageal reflux disease); and Diabetes mellitus without complication.   She has past surgical history that includes Cesarean section; Tubal ligation (1995); and Nasal sinus surgery (2003).   Her family history includes Cancer in her brother; Coronary artery disease in her father and mother; Diabetes in her sister; Heart disease in her father; Hypertension in her mother; Stroke in her paternal uncle.She reports that she has been smoking Cigarettes.  She has been smoking about 0.50 packs per day. She has never used smokeless tobacco. She reports that she does not drink alcohol or use illicit drugs.  Outpatient Prescriptions Prior to Visit  Medication Sig Dispense  Refill  . aspirin EC 81 MG tablet Take 1 tablet (81 mg total) by mouth daily. 30 tablet 11  . glipiZIDE (GLUCOTROL) 5 MG tablet Take 1 tablet (5 mg total) by mouth 2 (two) times daily before a meal. 60 tablet 3  . glucose blood (BAYER CONTOUR NEXT TEST) test strip  Use as instructed to test blood sugar 1-2 times daily dx: 250.02 100 each 1  . losartan-hydrochlorothiazide (HYZAAR) 100-25 MG per tablet Take 1 tablet by mouth daily. 90 tablet 1  . metFORMIN (GLUCOPHAGE) 500 MG tablet Take 1 tablet (500 mg total) by mouth 2 (two) times daily with a meal. 180 tablet 1  . psyllium (METAMUCIL) 0.52 G capsule Take 0.52 g by mouth daily.    . Vitamin D, Cholecalciferol, 1000 UNITS TABS Take 1 tablet by mouth daily.     No facility-administered medications prior to visit.    Review of Systems   Patient denies headache, fevers, malaise, unintentional weight loss, skin rash, eye pain, sinus congestion and sinus pain, sore throat, dysphagia,  hemoptysis , cough, dyspnea, wheezing, chest pain, palpitations, orthopnea, edema, abdominal pain, nausea, melena, diarrhea, constipation, flank pain, dysuria, hematuria, urinary  Frequency, nocturia, numbness, tingling, seizures,  Focal weakness, Loss of consciousness,  Tremor, insomnia, depression, anxiety, and suicidal ideation.      Objective:  BP 122/80 mmHg  Pulse 74  Temp(Src) 97.6 F (36.4 C) (Oral)  Resp 16  Ht 5\' 3"  (1.6 m)  Wt 225 lb 8 oz (102.286 kg)  BMI 39.96 kg/m2  SpO2 98%  Physical Exam   General Appearance:    Alert, cooperative, no distress, appears stated age  Head:    Normocephalic, without obvious abnormality, atraumatic  Eyes:    PERRL, conjunctiva/corneas clear, EOM's intact, fundi    benign, both eyes  Ears:    Normal TM's and external ear canals, both ears  Nose:   Nares normal, septum midline, mucosa normal, no drainage    or sinus tenderness  Throat:   Lips, mucosa, and tongue normal; teeth and gums normal  Neck:   Supple, symmetrical, trachea midline, no adenopathy;    thyroid:  no enlargement/tenderness/nodules; no carotid   bruit or JVD  Back:     Symmetric, no curvature, ROM normal, no CVA tenderness  Lungs:     Clear to auscultation bilaterally, respirations unlabored  Chest  Wall:    No tenderness or deformity   Heart:    Regular rate and rhythm, S1 and S2 normal, no murmur, rub   or gallop  Breast Exam:    No tenderness, masses, or nipple abnormality  Abdomen:     Soft, non-tender, bowel sounds active all four quadrants,    no masses, no organomegaly  Genitalia:    Pelvic: cervix normal in appearance, external genitalia normal, no adnexal masses or tenderness, no cervical motion tenderness, rectovaginal septum normal, uterus normal size, shape, and consistency and vagina normal without discharge  Extremities:   Extremities normal, atraumatic, no cyanosis or edema  Pulses:   2+ and symmetric all extremities  Skin:   Skin color, texture, turgor normal, no rashes or lesions  Lymph nodes:   Cervical, supraclavicular, and axillary nodes normal  Neurologic:   CNII-XII intact, normal strength, sensation and reflexes    throughout       Assessment & Plan:   Problem List Items Addressed This Visit      Unprioritized   Hyperlipidemia    She did  not tolerate pravastatin or  lipitor, but she has not been taking it. Untreated lipid panel is notable for LDL of 169  Lab Results  Component Value Date   CHOL 231* 09/24/2014   HDL 44.90 09/24/2014   LDLCALC 169* 09/24/2014   LDLDIRECT 160.3 11/07/2012   TRIG 85.0 09/24/2014   CHOLHDL 5 09/24/2014            Tobacco abuse counseling    Smoking cessation instruction/counseling given:  counseled patient on the dangers of tobacco use, advised patient to stop smoking, and reviewed strategies to maximize success      Type II diabetes mellitus, uncontrolled    Uncontrolled by current A1c. She continues to have very limited understanding of what foods to avoid despite 2 years of discussion.  Reviewed the role of low glycemic  diet and exercise. In May we increased the metformin to 1000 mg bid and add glipizide 5 mg bid.  Will have her return in one month with blood sugar log   Lab Results  Component Value Date    HGBA1C 9.5* 09/24/2014           Pap smear for cervical cancer screening - Primary    PAP smear was done today      Relevant Orders   Cytology - PAP   Encounter for preventive health examination    Annual wellness  exam was done as well as a comprehensive physical exam and management of acute and chronic conditions .  During the course of the visit the patient was educated and counseled about appropriate screening and preventive services including :  diabetes screening, lipid analysis with projected  10 year  risk for CAD , nutrition counseling, colorectal cancer screening, and recommended immunizations.  Printed recommendations for health maintenance screenings was given.        Breast cancer screening      I am having Ms. Haussler maintain her aspirin EC, glucose blood, metFORMIN, psyllium, losartan-hydrochlorothiazide, Vitamin D (Cholecalciferol), and glipiZIDE.  No orders of the defined types were placed in this encounter.    There are no discontinued medications.  Follow-up: Return in about 4 weeks (around 12/03/2014) for follow up diabetes.   Sherlene Shams, MD

## 2014-11-08 NOTE — Assessment & Plan Note (Signed)
PAP smear was done today 

## 2014-11-08 NOTE — Assessment & Plan Note (Signed)
She did  not tolerate pravastatin or lipitor, but she has not been taking it. Untreated lipid panel is notable for LDL of 169  Lab Results  Component Value Date   CHOL 231* 09/24/2014   HDL 44.90 09/24/2014   LDLCALC 169* 09/24/2014   LDLDIRECT 160.3 11/07/2012   TRIG 85.0 09/24/2014   CHOLHDL 5 09/24/2014

## 2014-11-08 NOTE — Assessment & Plan Note (Addendum)
Uncontrolled by current A1c. She continues to have very limited understanding of what foods to avoid despite 2 years of discussion.  Reviewed the role of low glycemic  diet and exercise. In May we increased the metformin to 1000 mg bid and add glipizide 5 mg bid.  Will have her return in one month with blood sugar log   Lab Results  Component Value Date   HGBA1C 9.5* 09/24/2014

## 2014-11-08 NOTE — Assessment & Plan Note (Signed)
Smoking cessation instruction/counseling given:  counseled patient on the dangers of tobacco use, advised patient to stop smoking, and reviewed strategies to maximize success 

## 2014-11-09 ENCOUNTER — Telehealth: Payer: Self-pay

## 2014-11-09 LAB — CYTOLOGY - PAP

## 2014-11-09 NOTE — Telephone Encounter (Signed)
[facility] called and is hoping Katelyn KingfisherMelissa Proctor can return their call regarding this patient's mammogram.

## 2014-11-10 ENCOUNTER — Encounter: Payer: Self-pay | Admitting: *Deleted

## 2014-11-18 ENCOUNTER — Ambulatory Visit
Admission: RE | Admit: 2014-11-18 | Discharge: 2014-11-18 | Disposition: A | Discharge status: Home / Self Care | Payer: BLUE CROSS/BLUE SHIELD | Source: Ambulatory Visit | Attending: Internal Medicine | Admitting: Internal Medicine

## 2014-11-18 DIAGNOSIS — Z1239 Encounter for other screening for malignant neoplasm of breast: Secondary | ICD-10-CM

## 2014-11-19 ENCOUNTER — Ambulatory Visit
Admission: RE | Admit: 2014-11-19 | Discharge: 2014-11-19 | Disposition: A | Discharge status: Home / Self Care | Payer: BLUE CROSS/BLUE SHIELD | Source: Ambulatory Visit | Attending: Internal Medicine | Admitting: Internal Medicine

## 2014-11-19 DIAGNOSIS — Z1231 Encounter for screening mammogram for malignant neoplasm of breast: Secondary | ICD-10-CM | POA: Insufficient documentation

## 2014-11-19 DIAGNOSIS — R921 Mammographic calcification found on diagnostic imaging of breast: Secondary | ICD-10-CM | POA: Diagnosis not present

## 2014-11-23 ENCOUNTER — Other Ambulatory Visit: Payer: Self-pay | Admitting: Internal Medicine

## 2014-11-23 DIAGNOSIS — R921 Mammographic calcification found on diagnostic imaging of breast: Secondary | ICD-10-CM

## 2014-11-23 DIAGNOSIS — R928 Other abnormal and inconclusive findings on diagnostic imaging of breast: Secondary | ICD-10-CM

## 2014-11-26 ENCOUNTER — Ambulatory Visit: Admission: RE | Admit: 2014-11-26 | Payer: BLUE CROSS/BLUE SHIELD | Source: Ambulatory Visit

## 2014-11-26 ENCOUNTER — Other Ambulatory Visit: Payer: BLUE CROSS/BLUE SHIELD

## 2014-12-03 ENCOUNTER — Ambulatory Visit: Payer: BLUE CROSS/BLUE SHIELD

## 2014-12-03 ENCOUNTER — Ambulatory Visit
Admission: RE | Admit: 2014-12-03 | Discharge: 2014-12-03 | Disposition: A | Discharge status: Home / Self Care | Payer: BLUE CROSS/BLUE SHIELD | Source: Ambulatory Visit | Attending: Internal Medicine | Admitting: Internal Medicine

## 2014-12-03 DIAGNOSIS — R928 Other abnormal and inconclusive findings on diagnostic imaging of breast: Secondary | ICD-10-CM | POA: Insufficient documentation

## 2014-12-03 DIAGNOSIS — R921 Mammographic calcification found on diagnostic imaging of breast: Secondary | ICD-10-CM

## 2014-12-04 ENCOUNTER — Other Ambulatory Visit: Payer: Self-pay | Admitting: Internal Medicine

## 2014-12-08 ENCOUNTER — Ambulatory Visit: Payer: BLUE CROSS/BLUE SHIELD | Admitting: Internal Medicine

## 2014-12-27 ENCOUNTER — Other Ambulatory Visit: Payer: Self-pay | Admitting: *Deleted

## 2014-12-27 MED ORDER — GLUCOSE BLOOD VI STRP: ORAL_STRIP | Status: DC

## 2015-01-12 ENCOUNTER — Encounter: Payer: Self-pay | Admitting: Internal Medicine

## 2015-01-12 ENCOUNTER — Ambulatory Visit (INDEPENDENT_AMBULATORY_CARE_PROVIDER_SITE_OTHER): Payer: BLUE CROSS/BLUE SHIELD | Admitting: Internal Medicine

## 2015-01-12 VITALS — BP 140/80 | HR 88 | Temp 97.6°F | Resp 14 | Ht 63.0 in | Wt 224.2 lb

## 2015-01-12 DIAGNOSIS — Z716 Tobacco abuse counseling: Secondary | ICD-10-CM | POA: Diagnosis not present

## 2015-01-12 DIAGNOSIS — E785 Hyperlipidemia, unspecified: Secondary | ICD-10-CM

## 2015-01-12 DIAGNOSIS — I10 Essential (primary) hypertension: Secondary | ICD-10-CM

## 2015-01-12 DIAGNOSIS — IMO0002 Reserved for concepts with insufficient information to code with codable children: Secondary | ICD-10-CM

## 2015-01-12 DIAGNOSIS — E1165 Type 2 diabetes mellitus with hyperglycemia: Secondary | ICD-10-CM

## 2015-01-12 DIAGNOSIS — E119 Type 2 diabetes mellitus without complications: Secondary | ICD-10-CM | POA: Diagnosis not present

## 2015-01-12 DIAGNOSIS — E1159 Type 2 diabetes mellitus with other circulatory complications: Secondary | ICD-10-CM

## 2015-01-12 DIAGNOSIS — E1169 Type 2 diabetes mellitus with other specified complication: Secondary | ICD-10-CM

## 2015-01-12 DIAGNOSIS — E669 Obesity, unspecified: Secondary | ICD-10-CM

## 2015-01-12 MED ORDER — ONETOUCH DELICA LANCETS FINE MISC: Status: DC

## 2015-01-12 NOTE — Progress Notes (Signed)
Pre-visit discussion using our clinic review tool. No additional management support is needed unless otherwise documented below in the visit note.  

## 2015-01-12 NOTE — Progress Notes (Signed)
Subjective:  Patient ID: Katelyn Proctor, female    DOB: March 26, 1961  Age: 54 y.o. MRN: [id number]  CC: The primary encounter diagnosis was Type II diabetes mellitus, uncontrolled. Diagnoses of Obesity, morbid, BMI 40.0-49.9, Obesity, diabetes, and hypertension syndrome, Tobacco abuse counseling, and Hyperlipidemia were also pertinent to this visit.  HPI Katelyn Proctor presents for follow up on uncontrolled DM.  She els generally well, is not exercising  But is  checking blood sugars wice  daily at variable times.  BS have been under 130 fasting and < 150 post prandially.  Has been having recurrent asymptomatic hypoglyemic events.  Taking her medications incorrectly, not before eating, complicated by a 3rd shift work schedule.  Following a carbohydrate modified diet 6 days per week. Denies numbness, burning and tingling of extremities. Appetite is good.    Eats well at work   Diet reviewed in detail.    Outpatient Prescriptions Prior to Visit  Medication Sig Dispense Refill  . aspirin EC 81 MG tablet Take 1 tablet (81 mg total) by mouth daily. 30 tablet 11  . glipiZIDE (GLUCOTROL) 5 MG tablet Take 1 tablet (5 mg total) by mouth 2 (two) times daily before a meal. 60 tablet 3  . glucose blood (BAYER CONTOUR NEXT TEST) test strip Use as instructed to test blood sugar 1-2 times daily dx: 250.02 100 each 1  . glucose blood (ONE TOUCH ULTRA TEST) test strip Use to test Blood Sugar 3 times daily.  Dx:  E11.65 100 each 12  . losartan-hydrochlorothiazide (HYZAAR) 100-25 MG per tablet TAKE 1 TABLET DAILY 90 tablet 0  . metFORMIN (GLUCOPHAGE) 500 MG tablet Take 1 tablet (500 mg total) by mouth 2 (two) times daily with a meal. 180 tablet 1  . psyllium (METAMUCIL) 0.52 G capsule Take 0.52 g by mouth daily.    . Vitamin D, Cholecalciferol, 1000 UNITS TABS Take 1 tablet by mouth daily.     No facility-administered medications prior to visit.    Review of Systems;  Patient denies headache, fevers,  malaise, unintentional weight loss, skin rash, eye pain, sinus congestion and sinus pain, sore throat, dysphagia,  hemoptysis , cough, dyspnea, wheezing, chest pain, palpitations, orthopnea, edema, abdominal pain, nausea, melena, diarrhea, constipation, flank pain, dysuria, hematuria, urinary  Frequency, nocturia, numbness, tingling, seizures,  Focal weakness, Loss of consciousness,  Tremor, insomnia, depression, anxiety, and suicidal ideation.      Objective:  BP 140/80 mmHg  Pulse 88  Temp(Src) 97.6 F (36.4 C) (Oral)  Resp 14  Ht 5\' 3"  (1.6 m)  Wt 224 lb 4 oz (101.719 kg)  BMI 39.73 kg/m2  SpO2 96%  BP Readings from Last 3 Encounters:  01/12/15 140/80  11/05/14 122/80  09/24/14 128/84    Wt Readings from Last 3 Encounters:  01/12/15 224 lb 4 oz (101.719 kg)  11/05/14 225 lb 8 oz (102.286 kg)  09/24/14 230 lb 8 oz (104.554 kg)    General appearance: alert, cooperative and appears stated age Ears: normal TM's and external ear canals both ears Throat: lips, mucosa, and tongue normal; teeth and gums normal Neck: no adenopathy, no carotid bruit, supple, symmetrical, trachea midline and thyroid not enlarged, symmetric, no tenderness/mass/nodules Back: symmetric, no curvature. ROM normal. No CVA tenderness. Lungs: clear to auscultation bilaterally Heart: regular rate and rhythm, S1, S2 normal, no murmur, click, rub or gallop Abdomen: soft, non-tender; bowel sounds normal; no masses,  no organomegaly Pulses: 2+ and symmetric Skin: Skin color, texture, turgor normal.  No rashes or lesions Lymph nodes: Cervical, supraclavicular, and axillary nodes normal.  Lab Results  Component Value Date   HGBA1C 6.9* 01/12/2015   HGBA1C 9.5* 09/24/2014   HGBA1C 7.4* 01/01/2014    Lab Results  Component Value Date   CREATININE 0.70 01/12/2015   CREATININE 0.64 09/24/2014   CREATININE 0.6 01/01/2014    Lab Results  Component Value Date   GLUCOSE 103* 01/12/2015   CHOL 218* 01/12/2015     TRIG 83.0 01/12/2015   HDL 48.20 01/12/2015   LDLDIRECT 158.0 01/12/2015   LDLCALC 153* 01/12/2015   ALT 17 01/12/2015   AST 16 01/12/2015   NA 143 01/12/2015   K 4.1 01/12/2015   CL 107 01/12/2015   CREATININE 0.70 01/12/2015   BUN 14 01/12/2015   CO2 22 01/12/2015   TSH 1.06 01/12/2015   HGBA1C 6.9* 01/12/2015   MICROALBUR <0.7 01/12/2015    Mm Digital Diagnostic Unilat L  12/03/2014   CLINICAL DATA:  Calcifications in the upper inner quadrant of the left breast at recent screening mammography.  EXAM: DIGITAL DIAGNOSTIC LEFT MAMMOGRAM  COMPARISON:  Previous examinations, including the screening mammogram dated 11/19/2014.  ACR Breast Density Category b: There are scattered areas of fibroglandular density.  FINDINGS: Spot magnification views of the left breast demonstrate a 10 mm group of microcalcifications in the upper inner quadrant of the left breast in the middle third. These vary in size. The larger calcifications are coarse and coral-like in appearance.  IMPRESSION: Benign left breast calcifications, compatible with a degenerated fibroadenoma.  RECOMMENDATION: Bilateral screening mammogram in 1 year.  I have discussed the findings and recommendations with the patient. Results were also provided in writing at the conclusion of the visit. If applicable, a reminder letter will be sent to the patient regarding the next appointment.  BI-RADS CATEGORY  2: Benign.   Electronically Signed   By: Beckie Salts M.D.   On: 12/03/2014 10:27    Assessment & Plan:   Problem List Items Addressed This Visit      Unprioritized   Hyperlipidemia    Untreated due to statin intolerance.       Tobacco abuse counseling    Spent 3 minutes discussing risk of continued tobacco abuse, including but not limited to CAD, PAD, hypertension, and CA.  She is not interested in pharmacotherapy at this time.Smoking cessation instruction/counseling given: commended patient for reducing daily use. And encouraged   Patient to continue reduction in daily use by 1 cigarette every week      Obesity, diabetes, and hypertension syndrome - Primary    Improved control. Reminder for annual diabetic eye exam given..  Foot exam done. Meds reviewed and she is on a baby aspirin daily., and an ACE inhibitor.  Urine tested for protein.   Lab Results  Component Value Date   HGBA1C 6.9* 01/12/2015   Lab Results  Component Value Date   MICROALBUR <0.7 01/12/2015         Obesity, morbid, BMI 40.0-49.9    She has gained weight with imprved control of diabetes.  I have addressed  BMI and recommended wt loss of 10% of body weigh over the next 6 months using a low glycemic index diet and regular exercise a minimum of 5 days per week.           I am having Katelyn Proctor start on [employer]. I am also having her maintain her aspirin EC, glucose blood, metFORMIN, psyllium, Vitamin D (  Cholecalciferol), glipiZIDE, losartan-hydrochlorothiazide, and glucose blood.  Meds ordered this encounter  Medications  . ONETOUCH DELICA LANCETS FINE MISC    Sig: To test blood sugar 3 times daily    Dispense:  100 each    Refill:  6    DX code E11.65    There are no discontinued medications.  Follow-up: Return in about 3 months (around 04/13/2015) for follow up diabetes.   Sherlene Shams, MD

## 2015-01-12 NOTE — Patient Instructions (Addendum)
Your BS when you wake up should be < 140.  Your post prandials are perfect ,  At < 150  It should not be lower than 80 EVER.     Take your glipizide and metformin at 8 am before your breakfast,  And second dose of both should  Be at 6 pm before your dinner     Breakfast can be an apple plus ounce or two of cheese,  Or add bacon  , or [place] sausage  WE WILL TRY TO FIND YOU A DIABETES EDUCATION CLASS THAT MEETS AT 2:00   Basic Carbohydrate Counting for Diabetes Mellitus Carbohydrate counting is a method for keeping track of the amount of carbohydrates you eat. Eating carbohydrates naturally increases the level of sugar (glucose) in your blood, so it is important for you to know the amount that is okay for you to have in every meal. Carbohydrate counting helps keep the level of glucose in your blood within normal limits. The amount of carbohydrates allowed is different for every person. A dietitian can help you calculate the amount that is right for you. Once you know the amount of carbohydrates you can have, you can count the carbohydrates in the foods you want to eat. Carbohydrates are found in the following foods:  Grains, such as breads and cereals.  Dried beans and soy products.  Starchy vegetables, such as potatoes, peas, and corn.  Fruit and fruit juices.  Milk and yogurt.  Sweets and snack foods, such as cake, cookies, candy, chips, soft drinks, and fruit drinks. CARBOHYDRATE COUNTING There are two ways to count the carbohydrates in your food. You can use either of the methods or a combination of both. Reading the "Nutrition Facts" on [place] The "Nutrition Facts" is an area that is included on the labels of almost all packaged food and beverages in the [place]. It includes the serving size of that food or beverage and information about the nutrients in each serving of the food, including the grams (g) of carbohydrate per serving.  Decide the number of servings of this  food or beverage that you will be able to eat or drink. Multiply that number of servings by the number of grams of carbohydrate that is listed on the label for that serving. The total will be the amount of carbohydrates you will be having when you eat or drink this food or beverage. Learning Standard Serving Sizes of Food When you eat food that is not packaged or does not include "Nutrition Facts" on the label, you need to measure the servings in order to count the amount of carbohydrates.A serving of most carbohydrate-rich foods contains about 15 g of carbohydrates. The following list includes serving sizes of carbohydrate-rich foods that provide 15 g ofcarbohydrate per serving:   1 slice of bread (1 oz) or 1 six-inch tortilla.    of a hamburger bun or English muffin.  4-6 crackers.   cup unsweetened dry cereal.    cup hot cereal.   cup rice or pasta.    cup mashed potatoes or  of a large baked potato.  1 cup fresh fruit or one small piece of fruit.    cup canned or frozen fruit or fruit juice.  1 cup milk.   cup plain fat-free yogurt or yogurt sweetened with artificial sweeteners.   cup cooked dried beans or starchy vegetable, such as peas, corn, or potatoes.  Decide the number of standard-size servings that you will eat. Multiply that  number of servings by 15 (the grams of carbohydrates in that serving). For example, if you eat 2 cups of strawberries, you will have eaten 2 servings and 30 g of carbohydrates (2 servings x 15 g = 30 g). For foods such as soups and casseroles, in which more than one food is mixed in, you will need to count the carbohydrates in each food that is included. EXAMPLE OF CARBOHYDRATE COUNTING Sample Dinner  3 oz chicken breast.   cup of brown rice.   cup of corn.  1 cup milk.   1 cup strawberries with sugar-free whipped topping.  Carbohydrate Calculation Step 1: Identify the foods that contain carbohydrates:   Rice.   Corn.    Milk.   Strawberries. Step 2:Calculate the number of servings eaten of each:   2 servings of rice.   1 serving of corn.   1 serving of milk.   1 serving of strawberries. Step 3: Multiply each of those number of servings by 15 g:   2 servings of rice x 15 g = 30 g.   1 serving of corn x 15 g = 15 g.   1 serving of milk x 15 g = 15 g.   1 serving of strawberries x 15 g = 15 g. Step 4: Add together all of the amounts to find the total grams of carbohydrates eaten: 30 g + 15 g + 15 g + 15 g = 75 g. Document Released: 04/30/2005 Document Revised: 09/14/2013 Document Reviewed: 03/27/2013 [facility] Patient Information 2015 [place], [place]. This information is not intended to replace advice given to you by your health care provider. Make sure you discuss any questions you have with your health care provider.  Diabetes and Standards of Medical Care Diabetes is complicated. You may find that your diabetes team includes a dietitian, nurse, diabetes educator, eye doctor, and more. To help everyone know what is going on and to help you get the care you deserve, the following schedule of care was developed to help keep you on track. Below are the tests, exams, vaccines, medicines, education, and plans you will need. HbA1c test This test shows how well you have controlled your glucose over the past 2-3 months. It is used to see if your diabetes management plan needs to be adjusted.   It is performed at least 2 times a year if you are meeting treatment goals.  It is performed 4 times a year if therapy has changed or if you are not meeting treatment goals. Blood pressure test  This test is performed at every routine medical visit. The goal is less than 140/90 mm Hg for most people, but 130/80 mm Hg in some cases. Ask your health care provider about your goal. Dental exam  Follow up with the dentist regularly. Eye exam  If you are diagnosed with type 1 diabetes as a child, get an  exam upon reaching the age of 55 years or older and have had diabetes for 3-5 years. Yearly eye exams are recommended after that initial eye exam.  If you are diagnosed with type 1 diabetes as an adult, get an exam within 5 years of diagnosis and then yearly.  If you are diagnosed with type 2 diabetes, get an exam as soon as possible after the diagnosis and then yearly. Foot care exam  Visual foot exams are performed at every routine medical visit. The exams check for cuts, injuries, or other problems with the feet.  A comprehensive foot  exam should be done yearly. This includes visual inspection as well as assessing foot pulses and testing for loss of sensation.  Check your feet nightly for cuts, injuries, or other problems with your feet. Tell your health care provider if anything is not healing. Kidney function test (urine microalbumin)  This test is performed once a year.  Type 1 diabetes: The first test is performed 5 years after diagnosis.  Type 2 diabetes: The first test is performed at the time of diagnosis.  A serum creatinine and estimated glomerular filtration rate (eGFR) test is done once a year to assess the level of chronic kidney disease (CKD), if present. Lipid profile (cholesterol, HDL, LDL, triglycerides)  Performed every 5 years for most people.  The goal for LDL is less than 100 mg/dL. If you are at high risk, the goal is less than 70 mg/dL.  The goal for HDL is 40 mg/dL-50 mg/dL for men and 50 mg/dL-60 mg/dL for women. An HDL cholesterol of 60 mg/dL or higher gives some protection against heart disease.  The goal for triglycerides is less than 150 mg/dL. Influenza vaccine, pneumococcal vaccine, and hepatitis B vaccine  The influenza vaccine is recommended yearly.  It is recommended that people with diabetes who are over 51 years old get the pneumonia vaccine. In some cases, two separate shots may be given. Ask your health care provider if your pneumonia  vaccination is up to date.  The hepatitis B vaccine is also recommended for adults with diabetes. Diabetes self-management education  Education is recommended at diagnosis and ongoing as needed. Treatment plan  Your treatment plan is reviewed at every medical visit. Document Released: 02/25/2009 Document Revised: 09/14/2013 Document Reviewed: 09/30/2012 [facility] Patient Information 2015 [place], [place]. This information is not intended to replace advice given to you by your health care provider. Make sure you discuss any questions you have with your health care provider.

## 2015-01-13 LAB — COMPREHENSIVE METABOLIC PANEL
ALK PHOS: 89 U/L (ref 39–117)
ALT: 17 U/L (ref 0–35)
AST: 16 U/L (ref 0–37)
Albumin: 4.1 g/dL (ref 3.5–5.2)
BUN: 14 mg/dL (ref 6–23)
CHLORIDE: 107 meq/L (ref 96–112)
CO2: 22 meq/L (ref 19–32)
Calcium: 10 mg/dL (ref 8.4–10.5)
Creatinine, Ser: 0.7 mg/dL (ref 0.40–1.20)
GFR: 111.55 mL/min (ref >60.00)
GLUCOSE: 103 mg/dL — AB (ref 70–99)
POTASSIUM: 4.1 meq/L (ref 3.5–5.1)
SODIUM: 143 meq/L (ref 135–145)
Total Bilirubin: 0.3 mg/dL (ref 0.2–1.2)
Total Protein: 7.5 g/dL (ref 6.0–8.3)

## 2015-01-13 LAB — LIPID PANEL
CHOL/HDL RATIO: 5
Cholesterol: 218 mg/dL — ABNORMAL HIGH (ref 0–200)
HDL: 48.2 mg/dL (ref >39.00)
LDL CALC: 153 mg/dL — AB (ref 0–99)
NonHDL: 169.68
TRIGLYCERIDES: 83 mg/dL (ref 0.0–149.0)
VLDL: 16.6 mg/dL (ref 0.0–40.0)

## 2015-01-13 LAB — MICROALBUMIN / CREATININE URINE RATIO
Creatinine,U: 150.5 mg/dL
Microalb Creat Ratio: 0.5 mg/g (ref 0.0–30.0)
Microalb, Ur: <0.7 mg/dL (ref 0.0–1.9)

## 2015-01-13 LAB — LDL CHOLESTEROL, DIRECT: Direct LDL: 158 mg/dL

## 2015-01-13 LAB — HEMOGLOBIN A1C: Hgb A1c MFr Bld: 6.9 % — ABNORMAL HIGH (ref 4.6–6.5)

## 2015-01-13 LAB — TSH: TSH: 1.06 u[IU]/mL (ref 0.35–4.50)

## 2015-01-15 NOTE — Assessment & Plan Note (Signed)
Spent 3 minutes discussing risk of continued tobacco abuse, including but not limited to CAD, PAD, hypertension, and CA.  She is not interested in pharmacotherapy at this time.Smoking cessation instruction/counseling given: commended patient for reducing daily use. And encouraged  Patient to continue reduction in daily use by 1 cigarette every week 

## 2015-01-15 NOTE — Assessment & Plan Note (Signed)
Untreated due to statin intolerance  

## 2015-01-15 NOTE — Assessment & Plan Note (Signed)
She has gained weight with imprved control of diabetes.  I have addressed  BMI and recommended wt loss of 10% of body weigh over the next 6 months using a low glycemic index diet and regular exercise a minimum of 5 days per week.

## 2015-01-15 NOTE — Assessment & Plan Note (Signed)
Improved control. Reminder for annual diabetic eye exam given..  Foot exam done. Meds reviewed and she is on a baby aspirin daily., and an ACE inhibitor.  Urine tested for protein.   Lab Results  Component Value Date   HGBA1C 6.9* 01/12/2015   Lab Results  Component Value Date   MICROALBUR <0.7 01/12/2015

## 2015-01-18 ENCOUNTER — Telehealth: Payer: Self-pay | Admitting: Internal Medicine

## 2015-01-18 NOTE — Telephone Encounter (Signed)
Notified patient that her FMLA form was here at the office ready to be picked up at her convenience.

## 2015-01-18 NOTE — Telephone Encounter (Signed)
FMLA form has been completed and returned to you in blue folder.  Please make copy for chart,   , patient to pick up form and to be responsible for submitting it to employer,  The charge is $50

## 2015-02-14 ENCOUNTER — Telehealth: Payer: Self-pay | Admitting: Internal Medicine

## 2015-02-23 ENCOUNTER — Encounter: Payer: Self-pay | Admitting: Internal Medicine

## 2015-02-23 ENCOUNTER — Ambulatory Visit (INDEPENDENT_AMBULATORY_CARE_PROVIDER_SITE_OTHER): Payer: BLUE CROSS/BLUE SHIELD | Admitting: Internal Medicine

## 2015-02-23 VITALS — BP 148/88 | HR 81 | Temp 97.5°F | Resp 12 | Ht 63.0 in | Wt 227.5 lb

## 2015-02-23 DIAGNOSIS — G4459 Other complicated headache syndrome: Secondary | ICD-10-CM

## 2015-02-23 MED ORDER — FLUTICASONE PROPIONATE 50 MCG/ACT NA SUSP: 2.0000 | Freq: Every day | NASAL | Status: DC

## 2015-02-23 MED ORDER — PREDNISONE 10 MG PO TABS: ORAL_TABLET | ORAL | Status: DC

## 2015-02-23 NOTE — Progress Notes (Signed)
Subjective:  Patient ID: Katelyn Proctor, female    DOB: 1960/06/26  Age: 54 y.o. MRN: [id number]  CC: The encounter diagnosis was Headache syndrome, complicated.  HPI Katelyn Proctor presents for  Persistent headaches.  She has chronic sinus congestion which results in excessive post  Nasal drip that causes morning nausea .  History of cerebrospinal fluid leak requiring endoscopic surgery in 2003  by ENT by Katelyn Proctor at [facility] which transiently improved her headache pattern for a few years.  However, her headaches began to become more frequent about a  year ago.   Worse with weather changes.  Averaged two moderately severe headaches per week last winter.Had a headache so severe last year that she stayed out of work.   Headaches are relieved by lying in a dark room.  Headache occurs in both temples.   Has left sided nasal bridge pain because the safety glasses she wears at work press on the area of prior surgery.      Outpatient Prescriptions Prior to Visit  Medication Sig Dispense Refill  . aspirin EC 81 MG tablet Take 1 tablet (81 mg total) by mouth daily. 30 tablet 11  . glipiZIDE (GLUCOTROL) 5 MG tablet Take 1 tablet (5 mg total) by mouth 2 (two) times daily before a meal. 60 tablet 3  . glucose blood (BAYER CONTOUR NEXT TEST) test strip Use as instructed to test blood sugar 1-2 times daily dx: 250.02 100 each 1  . glucose blood (ONE TOUCH ULTRA TEST) test strip Use to test Blood Sugar 3 times daily.  Dx:  E11.65 100 each 12  . losartan-hydrochlorothiazide (HYZAAR) 100-25 MG per tablet TAKE 1 TABLET DAILY 90 tablet 0  . metFORMIN (GLUCOPHAGE) 500 MG tablet Take 1 tablet (500 mg total) by mouth 2 (two) times daily with a meal. 180 tablet 1  . ONETOUCH DELICA LANCETS FINE MISC To test blood sugar 3 times daily 100 each 6  . psyllium (METAMUCIL) 0.52 G capsule Take 0.52 g by mouth daily.    . Vitamin D, Cholecalciferol, 1000 UNITS TABS Take 1 tablet by mouth daily.     No facility-administered  medications prior to visit.    Review of Systems;  Patient denies headache, fevers, malaise, unintentional weight loss, skin rash, eye pain, sinus congestion and sinus pain, sore throat, dysphagia,  hemoptysis , cough, dyspnea, wheezing, chest pain, palpitations, orthopnea, edema, abdominal pain, nausea, melena, diarrhea, constipation, flank pain, dysuria, hematuria, urinary  Frequency, nocturia, numbness, tingling, seizures,  Focal weakness, Loss of consciousness,  Tremor, insomnia, depression, anxiety, and suicidal ideation.      Objective:  BP 148/88 mmHg  Pulse 81  Temp(Src) 97.5 F (36.4 C) (Oral)  Resp 12  Ht  (1.6 m)  Wt 227 lb 8 oz (103.193 kg)  BMI 40.31 kg/m2  SpO2 98%  BP Readings from Last 3 Encounters:  02/23/15 148/88  01/12/15 140/80  11/05/14 122/80    Wt Readings from Last 3 Encounters:  02/23/15 227 lb 8 oz (103.193 kg)  01/12/15 224 lb 4 oz (101.719 kg)  11/05/14 225 lb 8 oz (102.286 kg)    General appearance: alert, cooperative and appears stated age Ears: normal TM's and external ear canals both ears Throat: lips, mucosa, and tongue normal; teeth and gums normal Neck: no adenopathy, no carotid bruit, supple, symmetrical, trachea midline and thyroid not enlarged, symmetric, no tenderness/mass/nodules Back: symmetric, no curvature. ROM normal. No CVA tenderness. Lungs: clear to auscultation bilaterally Heart: regular  rate and rhythm, S1, S2 normal, no murmur, click, rub or gallop Abdomen: soft, non-tender; bowel sounds normal; no masses,  no organomegaly Pulses: 2+ and symmetric Skin: Skin color, texture, turgor normal. No rashes or lesions Lymph nodes: Cervical, supraclavicular, and axillary nodes normal.  Lab Results  Component Value Date   HGBA1C 6.9* 01/12/2015   HGBA1C 9.5* 09/24/2014   HGBA1C 7.4* 01/01/2014    Lab Results  Component Value Date   CREATININE 0.70 01/12/2015   CREATININE 0.64 09/24/2014   CREATININE 0.6 01/01/2014     Lab Results  Component Value Date   GLUCOSE 103* 01/12/2015   CHOL 218* 01/12/2015   TRIG 83.0 01/12/2015   HDL 48.20 01/12/2015   LDLDIRECT 158.0 01/12/2015   LDLCALC 153* 01/12/2015   ALT 17 01/12/2015   AST 16 01/12/2015   NA 143 01/12/2015   K 4.1 01/12/2015   CL 107 01/12/2015   CREATININE 0.70 01/12/2015   BUN 14 01/12/2015   CO2 22 01/12/2015   TSH 1.06 01/12/2015   HGBA1C 6.9* 01/12/2015   MICROALBUR <0.7 01/12/2015    Mm Digital Diagnostic Unilat L  12/03/2014  CLINICAL DATA:  Calcifications in the upper inner quadrant of the left breast at recent screening mammography. EXAM: DIGITAL DIAGNOSTIC LEFT MAMMOGRAM COMPARISON:  Previous examinations, including the screening mammogram dated 11/19/2014. ACR Breast Density Category b: There are scattered areas of fibroglandular density. FINDINGS: Spot magnification views of the left breast demonstrate a 10 mm group of microcalcifications in the upper inner quadrant of the left breast in the middle third. These vary in size. The larger calcifications are coarse and coral-like in appearance. IMPRESSION: Benign left breast calcifications, compatible with a degenerated fibroadenoma. RECOMMENDATION: Bilateral screening mammogram in 1 year. I have discussed the findings and recommendations with the patient. Results were also provided in writing at the conclusion of the visit. If applicable, a reminder letter will be sent to the patient regarding the next appointment. BI-RADS CATEGORY  2: Benign. Electronically Signed   By: Katelyn SaltsSteven  Proctor M.D.   On: 12/03/2014 10:27    Assessment & Plan:   Problem List Items Addressed This Visit    Headache syndrome, complicated - Primary    She is concerned that the increase in headaches means that she has another CSF leak .  Without any trauma, I reassured her that this is unlikely and that she needs to reate her sinus inflammation . Advised to ue Flonase daily and prednisone taper prescribed.  If  headaches do not improve, will refer to ENT.    Will update FMLA forms to reflect additional health issues.          I am having Katelyn Proctor start on fluticasone and predniSONE. I am also having her maintain her aspirin EC, glucose blood, metFORMIN, psyllium, Vitamin D (Cholecalciferol), glipiZIDE, losartan-hydrochlorothiazide, glucose blood, and ONETOUCH DELICA LANCETS FINE.  Meds ordered this encounter  Medications  . fluticasone (FLONASE) 50 MCG/ACT nasal spray    Sig: Place 2 sprays into both nostrils daily.    Dispense:  48 g    Refill:  3  . predniSONE (DELTASONE) 10 MG tablet    Sig: 6 tablets on Day 1 , then reduce by 1 tablet daily until gone    Dispense:  21 tablet    Refill:  0    There are no discontinued medications.  Follow-up: Return for follow up diabetes.   Sherlene ShamsULLO, Stormey Wilborn L, MD

## 2015-02-23 NOTE — Patient Instructions (Addendum)
You need to use your flonase every single day  The prednisone taper will help decrease the inflammation in your sinuses that may be causing your headache  I want you to pick up Afrin nasal spray and use it twice daily for the next 5 days  Continue saline flush your sinus   I will revise your FMLA to allow more time off toi include your sinus issues and I will make a referral to a local ENT doctor

## 2015-02-23 NOTE — Progress Notes (Signed)
Pre-visit discussion using our clinic review tool. No additional management support is needed unless otherwise documented below in the visit note.  

## 2015-02-24 DIAGNOSIS — G4459 Other complicated headache syndrome: Secondary | ICD-10-CM | POA: Insufficient documentation

## 2015-02-24 DIAGNOSIS — Z0279 Encounter for issue of other medical certificate: Secondary | ICD-10-CM

## 2015-02-24 NOTE — Assessment & Plan Note (Addendum)
She is concerned that the increase in headaches means that she has another CSF leak .  Without any trauma, I reassured her that this is unlikely and that she needs to reate her sinus inflammation . Advised to ue Flonase daily and prednisone taper prescribed.  If headaches do not improve, will refer to ENT.    Will update FMLA forms to reflect additional health issues.

## 2015-02-25 ENCOUNTER — Telehealth: Payer: Self-pay | Admitting: Internal Medicine

## 2015-02-25 NOTE — Telephone Encounter (Signed)
Pt called about the FMLA that was dropped off pt states she has to fill out her portion and can pick it up when Dr Darrick Huntsmanullo is done filling it out. Thank You!

## 2015-02-25 NOTE — Telephone Encounter (Signed)
Notified patient FMLA ready sent copy to scan and to billing.

## 2015-03-01 ENCOUNTER — Telehealth: Payer: Self-pay | Admitting: *Deleted

## 2015-03-01 NOTE — Telephone Encounter (Signed)
Tried to reach patient voicemail full

## 2015-03-01 NOTE — Telephone Encounter (Signed)
Patient requested a call back about her FMLA paper work that she picked up, she stated that they where filled out wrong.

## 2015-03-01 NOTE — Telephone Encounter (Signed)
Kathy, please advise?

## 2015-03-01 NOTE — Telephone Encounter (Signed)
Tried to return call to patient no answer and no voicemail. 

## 2015-03-03 NOTE — Telephone Encounter (Signed)
Patient came and changes made.

## 2015-03-03 NOTE — Telephone Encounter (Signed)
Left message for patient to return call to office. 

## 2015-03-03 NOTE — Telephone Encounter (Signed)
Change dates and has to be at least 15 days not one day per month.

## 2015-03-05 ENCOUNTER — Other Ambulatory Visit: Payer: Self-pay | Admitting: Internal Medicine

## 2015-03-18 ENCOUNTER — Telehealth: Payer: Self-pay | Admitting: Internal Medicine

## 2015-03-18 NOTE — Telephone Encounter (Signed)
Called and spoke with the nurse.  Clarified and completed.

## 2015-03-18 NOTE — Telephone Encounter (Signed)
I don't know about this one.  The last page with the signature wasn't scanned in.

## 2015-03-18 NOTE — Telephone Encounter (Signed)
Pt called about her FMLA the nurse at her job needs to be called to verify who the nurse is that  signed the paper here at the office.  Nurse name at job [phone]. Pt does not know the nurse name at her job. Pt would like a call once its done please and thank You!

## 2015-10-28 NOTE — Telephone Encounter (Signed)
error 

## 2015-12-06 ENCOUNTER — Other Ambulatory Visit: Payer: Self-pay | Admitting: Internal Medicine

## 2015-12-06 DIAGNOSIS — Z1231 Encounter for screening mammogram for malignant neoplasm of breast: Secondary | ICD-10-CM

## 2015-12-19 ENCOUNTER — Other Ambulatory Visit: Payer: Self-pay | Admitting: Internal Medicine

## 2015-12-19 ENCOUNTER — Ambulatory Visit
Admission: RE | Admit: 2015-12-19 | Discharge: 2015-12-19 | Disposition: A | Discharge status: Home / Self Care | Payer: BLUE CROSS/BLUE SHIELD | Source: Ambulatory Visit | Attending: Internal Medicine | Admitting: Internal Medicine

## 2015-12-19 DIAGNOSIS — Z1231 Encounter for screening mammogram for malignant neoplasm of breast: Secondary | ICD-10-CM | POA: Insufficient documentation

## 2016-02-29 ENCOUNTER — Other Ambulatory Visit: Payer: Self-pay | Admitting: Internal Medicine

## 2016-02-29 NOTE — Telephone Encounter (Signed)
Last labs 8/16 last OV 02/23/15 please advise to refill.

## 2016-05-31 ENCOUNTER — Other Ambulatory Visit: Payer: Self-pay | Admitting: Internal Medicine

## 2016-08-30 ENCOUNTER — Other Ambulatory Visit: Payer: Self-pay | Admitting: Internal Medicine

## 2016-08-30 DIAGNOSIS — E119 Type 2 diabetes mellitus without complications: Secondary | ICD-10-CM

## 2016-08-30 DIAGNOSIS — I10 Essential (primary) hypertension: Secondary | ICD-10-CM

## 2016-08-30 DIAGNOSIS — E7800 Pure hypercholesterolemia, unspecified: Secondary | ICD-10-CM

## 2016-08-31 NOTE — Telephone Encounter (Signed)
No OV 02/23/15 last labs 8/16 ?

## 2016-09-04 DIAGNOSIS — E7800 Pure hypercholesterolemia, unspecified: Secondary | ICD-10-CM | POA: Insufficient documentation

## 2016-09-04 NOTE — Telephone Encounter (Signed)
Patient is overdue for follow up on diabetes and hypertension .  30 days only there will be no additional refills until she schedules a lab appt

## 2016-10-22 ENCOUNTER — Ambulatory Visit (INDEPENDENT_AMBULATORY_CARE_PROVIDER_SITE_OTHER): Payer: BLUE CROSS/BLUE SHIELD | Admitting: Internal Medicine

## 2016-10-22 ENCOUNTER — Encounter: Payer: Self-pay | Admitting: Internal Medicine

## 2016-10-22 VITALS — BP 118/82 | HR 81 | Temp 98.0°F | Resp 16 | Ht 63.0 in | Wt 222.2 lb

## 2016-10-22 DIAGNOSIS — E1159 Type 2 diabetes mellitus with other circulatory complications: Secondary | ICD-10-CM

## 2016-10-22 DIAGNOSIS — E669 Obesity, unspecified: Secondary | ICD-10-CM

## 2016-10-22 DIAGNOSIS — I152 Hypertension secondary to endocrine disorders: Secondary | ICD-10-CM

## 2016-10-22 DIAGNOSIS — F4321 Adjustment disorder with depressed mood: Secondary | ICD-10-CM

## 2016-10-22 DIAGNOSIS — L0232 Furuncle of buttock: Secondary | ICD-10-CM

## 2016-10-22 DIAGNOSIS — Z634 Disappearance and death of family member: Secondary | ICD-10-CM

## 2016-10-22 DIAGNOSIS — E1169 Type 2 diabetes mellitus with other specified complication: Secondary | ICD-10-CM

## 2016-10-22 DIAGNOSIS — I10 Essential (primary) hypertension: Secondary | ICD-10-CM

## 2016-10-22 DIAGNOSIS — E119 Type 2 diabetes mellitus without complications: Secondary | ICD-10-CM

## 2016-10-22 DIAGNOSIS — F4329 Adjustment disorder with other symptoms: Secondary | ICD-10-CM

## 2016-10-22 MED ORDER — SULFAMETHOXAZOLE-TRIMETHOPRIM 800-160 MG PO TABS: 1.0000 | ORAL_TABLET | Freq: Two times a day (BID) | ORAL | 0 refills | Status: DC

## 2016-10-22 NOTE — Patient Instructions (Signed)
Good to see you again !  have a boil on your buttock.  This needs to keep draining in order to heal completely.  Pack the area with gauze to catch the drainage  I am putting you on an antibiotic to take care of the bacteria that caused the infection   Use Dial soap on this area daily during your bath or shower.  Return in 1 week if not better.  I am checking your potassium today along with your diabetes and cholesterol

## 2016-10-22 NOTE — Progress Notes (Addendum)
Subjective:  Patient ID: Katelyn Proctor, female    DOB: 04-Jun-1960  Age: 56 y.o. MRN: [id number]  CC: The primary encounter diagnosis was Boil of buttock. Diagnoses of Obesity, diabetes, and hypertension syndrome (HCC), Obesity, morbid, BMI 40.0-49.9 (HCC), Diabetes mellitus without complication (HCC), Essential hypertension, Furuncle of buttock, and Complicated grief were also pertinent to this visit.  HPI Katelyn Proctor presents for evaluation of a persistent draining boil on her left buttock. The boil first became noticed about 3 weeks ago.  She had started a daily walking program and feels that the increased sweating contributed to the boil.  It started draining spontaneously , which relieved the pain,  And She stopped walking program hoping it would heal,  But it  has not. Denies fevers,  Systemic symptoms.    She does not use antibacterial soap . Has no idea what her blood sugars have been running.      Last seen Oct 2016.  Not taking metformin due to being lost to follow up  For Type 2 DM .  Has been living alone since her 32 yr old uncle died last year in her care.  Having trouble adjusting to living alone,  But does not want to assume caregiver role again for any more family members due to her own neglect of health.  Still grieving. Had several months of uncontrolled grief andnew onset panic attacks,  Now better but still anxious and [place].  [place] done on the house .   Mammogram done August 2017  Outpatient Medications Prior to Visit  Medication Sig Dispense Refill  . aspirin EC 81 MG tablet Take 1 tablet (81 mg total) by mouth daily. 30 tablet 11  . ONETOUCH DELICA LANCETS FINE MISC To test blood sugar 3 times daily 100 each 6  . Vitamin D, Cholecalciferol, 1000 UNITS TABS Take 1 tablet by mouth daily.    Marland Kitchen glucose blood (BAYER CONTOUR NEXT TEST) test strip Use as instructed to test blood sugar 1-2 times daily dx: 250.02  100 each 1  . glucose blood (ONE TOUCH ULTRA TEST) test strip Use to test Blood Sugar 3 times daily.  Dx:  E11.65 100 each 12  . losartan-hydrochlorothiazide (HYZAAR) 100-25 MG tablet Take 1 tablet by mouth daily. 30 tablet 0  . fluticasone (FLONASE) 50 MCG/ACT nasal spray Place 2 sprays into both nostrils daily. 48 g 3  . glipiZIDE (GLUCOTROL) 5 MG tablet Take 1 tablet (5 mg total) by mouth 2 (two) times daily before a meal. (Patient not taking: Reported on 10/22/2016) 60 tablet 3  . metFORMIN (GLUCOPHAGE) 500 MG tablet Take 1 tablet (500 mg total) by mouth 2 (two) times daily with a meal. (Patient not taking: Reported on 10/22/2016) 180 tablet 1  . predniSONE (DELTASONE) 10 MG tablet 6 tablets on Day 1 , then reduce by 1 tablet daily until gone (Patient not taking: Reported on 10/22/2016) 21 tablet 0  . psyllium (METAMUCIL) 0.52 G capsule Take 0.52 g by mouth daily.     No facility-administered medications prior to visit.     Review of Systems;  Patient denies headache, fevers, malaise, unintentional weight loss, skin rash, eye pain, sinus congestion and sinus pain, sore throat, dysphagia,  hemoptysis , cough, dyspnea, wheezing, chest pain, palpitations, orthopnea, edema, abdominal pain, nausea, melena, diarrhea, constipation, flank pain, dysuria, hematuria, urinary  Frequency, nocturia, numbness, tingling, seizures,  Focal weakness, Loss of consciousness,  Tremor,  insomnia, depression,  and suicidal ideation.      Objective:  BP 118/82 (BP Location: Left Arm, Patient Position: Sitting, Cuff Size: Large)   Pulse 81   Temp 98 F (36.7 C) (Oral)   Resp 16   Ht 5\' 3"  (1.6 m)   Wt 222 lb 3.2 oz (100.8 kg)   SpO2 98%   BMI 39.36 kg/m   BP Readings from Last 3 Encounters:  01/29/17 122/88  10/22/16 118/82  02/23/15 (!) 148/88    Wt Readings from Last 3 Encounters:  01/29/17 210 lb 9.6 oz (95.5 kg)  10/22/16 222 lb 3.2 oz (100.8 kg)  02/23/15 227 lb 8 oz (103.2 kg)    General  appearance: alert, tearful, cooperative and appears stated age Back: symmetric, no curvature. ROM normal. No CVA tenderness. Lungs: clear to auscultation bilaterally Heart: regular rate and rhythm, S1, S2 normal, no murmur, click, rub or gallop Abdomen: soft, non-tender; bowel sounds normal; no masses,  no organomegaly Pulses: 2+ and symmetric Skin: medium sized boil top of left buttock near the natal cleft.  No fluctuance, Skin color, texture, turgor normal. No rashes or lesions Lymph nodes: Cervical, supraclavicular, and axillary nodes normal.  Lab Results  Component Value Date   HGBA1C 7.9 (H) 01/28/2017   HGBA1C 12.2 (H) 10/22/2016   HGBA1C 6.9 (H) 01/12/2015    Lab Results  Component Value Date   CREATININE 0.66 01/28/2017   CREATININE 0.68 10/22/2016   CREATININE 0.70 01/12/2015    Lab Results  Component Value Date   WBC 8.0 10/22/2016   HGB 14.4 10/22/2016   HCT 41.7 10/22/2016   PLT 336.0 10/22/2016   GLUCOSE 134 (H) 01/28/2017   CHOL 196 01/28/2017   TRIG 61.0 01/28/2017   HDL 52.20 01/28/2017   LDLDIRECT 131.0 01/28/2017   LDLCALC 132 (H) 01/28/2017   ALT 11 01/28/2017   AST 11 01/28/2017   NA 138 01/28/2017   K 3.9 01/28/2017   CL 104 01/28/2017   CREATININE 0.66 01/28/2017   BUN 15 01/28/2017   CO2 27 01/28/2017   TSH 1.06 01/12/2015   HGBA1C 7.9 (H) 01/28/2017   MICROALBUR <0.7 10/22/2016    Mm Digital Screening Bilateral  Result Date: 12/20/2015 CLINICAL DATA:  Screening. EXAM: DIGITAL SCREENING BILATERAL MAMMOGRAM WITH CAD COMPARISON:  Previous exam(s). ACR Breast Density Category b: There are scattered areas of fibroglandular density. FINDINGS: There are no findings suspicious for malignancy. Images were processed with CAD. IMPRESSION: No mammographic evidence of malignancy. A result letter of this screening mammogram will be mailed directly to the patient. RECOMMENDATION: Screening mammogram in one year. (Code:SM-B-01Y) BI-RADS CATEGORY  1: Negative.  Electronically Signed   By: Rolla Plate M.D.   On: 12/20/2015 10:18    Assessment & Plan:   Problem List Items Addressed This Visit    Complicated grief    Patient is dealing with the  loss of elderly uncle and has adequate coping skills and emotional support .  She is o longer having panic attacks.       Diabetes mellitus without complication (HCC)    Has been  lost to follow up, her diabetes is uncontrolled  on diet alone. However, resume metformin  500 mg twice daily pending review of CMET a1c  and follow up in 3 months        Essential hypertension    Well controlled on current regimen of losartan/hct. Renal function is overdue , no changes today.  Lab Results  Component Value  Date   CREATININE 0.70 01/12/2015   Lab Results  Component Value Date   MICROALBUR <0.7 01/12/2015   Lab Results  Component Value Date   NA 143 01/12/2015   K 4.1 01/12/2015   CL 107 01/12/2015   CO2 22 01/12/2015         Furuncle of buttock    It is draining, and not fluctuant so no need ofr I&D at this time.  Septra DS  X 1 week,  Advised ot use Dial soalp om area dailu anmd cover with non stick telf to manage drainage .  Warm compresses to encourage continued draining      Obesity, diabetes, and hypertension syndrome (HCC)   Relevant Orders   Lipid panel (Completed)   Microalbumin / creatinine urine ratio (Completed)   Hemoglobin A1c (Completed)   Comprehensive metabolic panel (Completed)   Obesity, morbid, BMI 40.0-49.9 (HCC)    I have congratulated her  Regarding her new motivation to reduce her    BMI and encouraged  Continued weight loss with goal of 10% of body weigh over the next 6 months using a low glycemic index diet and regular exercise a minimum of 5 days per week.         Other Visit Diagnoses    Boil of buttock    -  Primary   Relevant Orders   CBC with Differential/Platelet (Completed)     A total of 25 minutes was spent with patient more than half of which  was spent in counseling patient on the above mentioned issues , reviewing and explaining recent labs and imaging studies done, and coordination of care.  I have discontinued Ms. Eldred MangesMiles's metFORMIN, psyllium, glipiZIDE, and predniSONE. I am also having her maintain her aspirin EC, Vitamin D (Cholecalciferol), ONETOUCH DELICA LANCETS FINE, and fluticasone.  Meds ordered this encounter  Medications  . DISCONTD: sulfamethoxazole-trimethoprim (BACTRIM DS,SEPTRA DS) 800-160 MG tablet    Sig: Take 1 tablet by mouth 2 (two) times daily.    Dispense:  14 tablet    Refill:  0    Medications Discontinued During This Encounter  Medication Reason  . predniSONE (DELTASONE) 10 MG tablet Therapy completed  . psyllium (METAMUCIL) 0.52 G capsule Patient has not taken in last 30 days  . glipiZIDE (GLUCOTROL) 5 MG tablet   . metFORMIN (GLUCOPHAGE) 500 MG tablet     Follow-up: Return in about 4 weeks (around 11/19/2016) for CPE.   Sherlene ShamsULLO, Novie Maggio L, MD

## 2016-10-23 ENCOUNTER — Encounter: Payer: Self-pay | Admitting: Internal Medicine

## 2016-10-23 DIAGNOSIS — L732 Hidradenitis suppurativa: Secondary | ICD-10-CM | POA: Insufficient documentation

## 2016-10-23 DIAGNOSIS — F4321 Adjustment disorder with depressed mood: Secondary | ICD-10-CM | POA: Insufficient documentation

## 2016-10-23 DIAGNOSIS — F4329 Adjustment disorder with other symptoms: Secondary | ICD-10-CM | POA: Insufficient documentation

## 2016-10-23 DIAGNOSIS — Z634 Disappearance and death of family member: Secondary | ICD-10-CM

## 2016-10-23 LAB — MICROALBUMIN / CREATININE URINE RATIO
CREATININE, U: 77.6 mg/dL
Microalb Creat Ratio: 0.9 mg/g (ref 0.0–30.0)

## 2016-10-23 LAB — COMPREHENSIVE METABOLIC PANEL
ALT: 12 U/L (ref 0–35)
AST: 11 U/L (ref 0–37)
Albumin: 4 g/dL (ref 3.5–5.2)
Alkaline Phosphatase: 104 U/L (ref 39–117)
BUN: 11 mg/dL (ref 6–23)
CHLORIDE: 102 meq/L (ref 96–112)
CO2: 30 mEq/L (ref 19–32)
Calcium: 9.6 mg/dL (ref 8.4–10.5)
Creatinine, Ser: 0.68 mg/dL (ref 0.40–1.20)
GFR: 114.6 mL/min (ref >60.00)
GLUCOSE: 329 mg/dL — AB (ref 70–99)
POTASSIUM: 3.9 meq/L (ref 3.5–5.1)
SODIUM: 137 meq/L (ref 135–145)
Total Bilirubin: 0.4 mg/dL (ref 0.2–1.2)
Total Protein: 7.5 g/dL (ref 6.0–8.3)

## 2016-10-23 LAB — CBC WITH DIFFERENTIAL/PLATELET
BASOS PCT: 1.5 % (ref 0.0–3.0)
Basophils Absolute: 0.1 10*3/uL (ref 0.0–0.1)
EOS PCT: 1.6 % (ref 0.0–5.0)
Eosinophils Absolute: 0.1 10*3/uL (ref 0.0–0.7)
HCT: 41.7 % (ref 36.0–46.0)
Hemoglobin: 14.4 g/dL (ref 12.0–15.0)
LYMPHS ABS: 1.9 10*3/uL (ref 0.7–4.0)
Lymphocytes Relative: 24 % (ref 12.0–46.0)
MCHC: 34.5 g/dL (ref 30.0–36.0)
MCV: 89.7 fl (ref 78.0–100.0)
MONO ABS: 0.6 10*3/uL (ref 0.1–1.0)
Monocytes Relative: 7.4 % (ref 3.0–12.0)
NEUTROS PCT: 65.5 % (ref 43.0–77.0)
Neutro Abs: 5.2 10*3/uL (ref 1.4–7.7)
Platelets: 336 10*3/uL (ref 150.0–400.0)
RBC: 4.65 Mil/uL (ref 3.87–5.11)
RDW: 13 % (ref 11.5–15.5)
WBC: 8 10*3/uL (ref 4.0–10.5)

## 2016-10-23 LAB — LIPID PANEL
CHOLESTEROL: 228 mg/dL — AB (ref 0–200)
HDL: 47.3 mg/dL (ref >39.00)
LDL Cholesterol: 159 mg/dL — ABNORMAL HIGH (ref 0–99)
NonHDL: 180.54
Total CHOL/HDL Ratio: 5
Triglycerides: 106 mg/dL (ref 0.0–149.0)
VLDL: 21.2 mg/dL (ref 0.0–40.0)

## 2016-10-23 LAB — HEMOGLOBIN A1C: HEMOGLOBIN A1C: 12.2 % — AB (ref 4.6–6.5)

## 2016-10-23 NOTE — Assessment & Plan Note (Signed)
Patient is dealing with the  loss of elderly uncle and has adequate coping skills and emotional support .  She is o longer having panic attacks.

## 2016-10-23 NOTE — Assessment & Plan Note (Signed)
Well controlled on current regimen of losartan/hct. Renal function is overdue , no changes today.  Lab Results  Component Value Date   CREATININE 0.70 01/12/2015   Lab Results  Component Value Date   MICROALBUR <0.7 01/12/2015   Lab Results  Component Value Date   NA 143 01/12/2015   K 4.1 01/12/2015   CL 107 01/12/2015   CO2 22 01/12/2015

## 2016-10-23 NOTE — Assessment & Plan Note (Signed)
It is draining, and not fluctuant so no need ofr I&D at this time.  Septra DS  X 1 week,  Advised ot use Dial soalp om area dailu anmd cover with non stick telf to manage drainage .  Warm compresses to encourage continued draining

## 2016-10-23 NOTE — Assessment & Plan Note (Signed)
I have congratulated her  Regarding her new motivation to reduce her    BMI and encouraged  Continued weight loss with goal of 10% of body weigh over the next 6 months using a low glycemic index diet and regular exercise a minimum of 5 days per week.

## 2016-10-23 NOTE — Assessment & Plan Note (Addendum)
Has been  lost to follow up, her diabetes is uncontrolled  on diet alone. However, resume metformin  500 mg twice daily pending review of CMET a1c  and follow up in 3 months

## 2016-10-24 ENCOUNTER — Other Ambulatory Visit: Payer: Self-pay | Admitting: Internal Medicine

## 2016-10-24 MED ORDER — "INSULIN SYRINGE/NEEDLE 28G X 1/2"" 1 ML MISC": 1.0000 | Freq: Every day | 0 refills | Status: DC

## 2016-10-24 MED ORDER — INSULIN DETEMIR 100 UNIT/ML FLEXPEN: 20.0000 [IU] | Freq: Every day | SUBCUTANEOUS | 1 refills | Status: DC

## 2016-10-25 ENCOUNTER — Telehealth: Payer: Self-pay | Admitting: Internal Medicine

## 2016-10-25 DIAGNOSIS — E1169 Type 2 diabetes mellitus with other specified complication: Principal | ICD-10-CM

## 2016-10-25 DIAGNOSIS — E1159 Type 2 diabetes mellitus with other circulatory complications: Secondary | ICD-10-CM

## 2016-10-25 DIAGNOSIS — E669 Obesity, unspecified: Secondary | ICD-10-CM

## 2016-10-25 DIAGNOSIS — I10 Essential (primary) hypertension: Principal | ICD-10-CM

## 2016-10-25 NOTE — Telephone Encounter (Signed)
  Please make a follow up lab appointment in one month to review Your blood sugars. You will need to bring a log of previous two weeks of blood sugars with you to your appt . I am interested in fastings and 2 hour post prandials (after a meal) along with a brief description of the meal content related to the particular blood sugars.    Talked with patient she is to call back when she receives the insulin and schedule a nurse visit for insulin teaching and lab appointment.

## 2016-10-25 NOTE — Telephone Encounter (Signed)
Pt called back returning your call. Please advise, thank you!  Call pt @ [phone]

## 2016-10-31 NOTE — Telephone Encounter (Signed)
Left message for patient to return my call,

## 2016-11-05 NOTE — Telephone Encounter (Signed)
Called patient back to see if she picked up Insulin and why she had not scheduled the nurse visit, patient stated she has not picked up the medication, that she is changing everything she has been doing her diet completely , no sodas,  No sweets , no breads ,  she will try this because she does not want to go on insulin. Patient would like to try with out insulin  To reduce her A1c, advised patient she needs to follow PCP  Direction but would notify  MD of her decision.

## 2016-11-06 NOTE — Telephone Encounter (Signed)
I have scheduled patient for lab and follow up with PCP, I also scheduled patient for nurse visit to teach patient how to use glucometer ok for teaching glucometer?

## 2016-11-06 NOTE — Telephone Encounter (Signed)
If that is her decision Then she needs to schedule a 3 month follow wup with A1c done PRIOR to next visit ,  Labs ordered for sept 12 earliest

## 2016-11-06 NOTE — Telephone Encounter (Signed)
Patient scheduled for nurse visit

## 2016-11-06 NOTE — Telephone Encounter (Signed)
Left message for patient to return call to office. 

## 2016-11-06 NOTE — Telephone Encounter (Signed)
Absolutely.  rn visit for glucometer teaching  .  Ask her at the rn visit if she would like a referral to Diabetes Education class

## 2016-11-22 ENCOUNTER — Ambulatory Visit (INDEPENDENT_AMBULATORY_CARE_PROVIDER_SITE_OTHER): Payer: BLUE CROSS/BLUE SHIELD | Admitting: *Deleted

## 2016-11-22 DIAGNOSIS — E119 Type 2 diabetes mellitus without complications: Secondary | ICD-10-CM

## 2016-11-22 MED ORDER — ONETOUCH ULTRASOFT LANCETS MISC: 12 refills | Status: DC

## 2016-11-22 MED ORDER — GLUCOSE BLOOD VI STRP: ORAL_STRIP | 12 refills | Status: DC

## 2016-11-22 NOTE — Progress Notes (Signed)
Patient presented for Glucometer teaching patient was shown steps to set up glucometer with accurate date and time , patient was given the One Touch Verio, to record CBG's at home. Patient instructed to check sugar fasting one day and the next day check 2 hours after a meal using different meals. And to write down brief description of meal. Patient stated she refuses to check sugars more than once daily. Patient was given [facility] Lipid list and patient was given copy of [place] diet with breads. Patient demonstrated proper use of glucometer back to nurse with CBG = 129  Fasting.

## 2016-11-22 NOTE — Addendum Note (Signed)
Addended by: Henrene PastorAVIS, Daveigh Batty R on: 11/22/2016 11:11 AM   Modules accepted: Orders

## 2016-11-29 ENCOUNTER — Telehealth: Payer: Self-pay | Admitting: Internal Medicine

## 2016-11-29 DIAGNOSIS — I10 Essential (primary) hypertension: Secondary | ICD-10-CM

## 2016-11-29 MED ORDER — LOSARTAN POTASSIUM-HCTZ 100-25 MG PO TABS: 1.0000 | ORAL_TABLET | Freq: Every day | ORAL | 0 refills | Status: DC

## 2016-11-29 NOTE — Telephone Encounter (Signed)
Pt called requesting  A refill on her losartan-hydrochlorothiazide (HYZAAR) 100-25 MG tablet. Please advise, thank you!  [place] [facility] [place] [address]

## 2016-11-30 ENCOUNTER — Telehealth: Payer: Self-pay | Admitting: Internal Medicine

## 2016-11-30 NOTE — Telephone Encounter (Signed)
Pharmacy called and needed clarification on how mamy times a day the patient test. Please advise, thank you!  [facility] [phone]

## 2016-11-30 NOTE — Telephone Encounter (Signed)
Spoke with pharmacy and informed them that per Dr. Melina Schoolsullo's last office note the pt is to be checking her sugars three times a week.

## 2017-01-01 ENCOUNTER — Other Ambulatory Visit: Payer: Self-pay | Admitting: *Deleted

## 2017-01-01 DIAGNOSIS — I10 Essential (primary) hypertension: Secondary | ICD-10-CM

## 2017-01-01 MED ORDER — LOSARTAN POTASSIUM-HCTZ 100-25 MG PO TABS: 1.0000 | ORAL_TABLET | Freq: Every day | ORAL | 0 refills | Status: DC

## 2017-01-01 NOTE — Telephone Encounter (Signed)
Refilled. Pt has an appt scheduled for 01/29/2017.

## 2017-01-01 NOTE — Telephone Encounter (Signed)
DR.Tullos patient

## 2017-01-01 NOTE — Telephone Encounter (Signed)
Pt requested a medication refill for losartan  Ph[facility]

## 2017-01-07 ENCOUNTER — Other Ambulatory Visit: Payer: Self-pay | Admitting: Internal Medicine

## 2017-01-07 DIAGNOSIS — Z1231 Encounter for screening mammogram for malignant neoplasm of breast: Secondary | ICD-10-CM

## 2017-01-18 ENCOUNTER — Ambulatory Visit
Admission: RE | Admit: 2017-01-18 | Discharge: 2017-01-18 | Disposition: A | Discharge status: Home / Self Care | Payer: BLUE CROSS/BLUE SHIELD | Source: Ambulatory Visit | Attending: Internal Medicine | Admitting: Internal Medicine

## 2017-01-18 DIAGNOSIS — Z1231 Encounter for screening mammogram for malignant neoplasm of breast: Secondary | ICD-10-CM | POA: Diagnosis not present

## 2017-01-28 ENCOUNTER — Other Ambulatory Visit (INDEPENDENT_AMBULATORY_CARE_PROVIDER_SITE_OTHER): Payer: BLUE CROSS/BLUE SHIELD

## 2017-01-28 DIAGNOSIS — E669 Obesity, unspecified: Secondary | ICD-10-CM

## 2017-01-28 DIAGNOSIS — E119 Type 2 diabetes mellitus without complications: Secondary | ICD-10-CM

## 2017-01-28 DIAGNOSIS — E1159 Type 2 diabetes mellitus with other circulatory complications: Secondary | ICD-10-CM

## 2017-01-28 DIAGNOSIS — E1169 Type 2 diabetes mellitus with other specified complication: Principal | ICD-10-CM

## 2017-01-28 DIAGNOSIS — I10 Essential (primary) hypertension: Secondary | ICD-10-CM

## 2017-01-28 LAB — COMPREHENSIVE METABOLIC PANEL
ALT: 11 U/L (ref 0–35)
AST: 11 U/L (ref 0–37)
Albumin: 3.9 g/dL (ref 3.5–5.2)
Alkaline Phosphatase: 80 U/L (ref 39–117)
BUN: 15 mg/dL (ref 6–23)
CHLORIDE: 104 meq/L (ref 96–112)
CO2: 27 meq/L (ref 19–32)
Calcium: 9.8 mg/dL (ref 8.4–10.5)
Creatinine, Ser: 0.66 mg/dL (ref 0.40–1.20)
GFR: 118.51 mL/min (ref >60.00)
Glucose, Bld: 134 mg/dL — ABNORMAL HIGH (ref 70–99)
POTASSIUM: 3.9 meq/L (ref 3.5–5.1)
SODIUM: 138 meq/L (ref 135–145)
Total Bilirubin: 0.6 mg/dL (ref 0.2–1.2)
Total Protein: 7.2 g/dL (ref 6.0–8.3)

## 2017-01-28 LAB — LIPID PANEL
Cholesterol: 196 mg/dL (ref 0–200)
HDL: 52.2 mg/dL (ref >39.00)
LDL Cholesterol: 132 mg/dL — ABNORMAL HIGH (ref 0–99)
NonHDL: 144.22
TRIGLYCERIDES: 61 mg/dL (ref 0.0–149.0)
Total CHOL/HDL Ratio: 4
VLDL: 12.2 mg/dL (ref 0.0–40.0)

## 2017-01-28 LAB — HEMOGLOBIN A1C: Hgb A1c MFr Bld: 7.9 % — ABNORMAL HIGH (ref 4.6–6.5)

## 2017-01-28 LAB — LDL CHOLESTEROL, DIRECT: Direct LDL: 131 mg/dL

## 2017-01-28 NOTE — Addendum Note (Signed)
Addended by: Penne Lash on: 01/28/2017 09:04 AM   Modules accepted: Orders

## 2017-01-29 ENCOUNTER — Encounter: Payer: Self-pay | Admitting: Internal Medicine

## 2017-01-29 ENCOUNTER — Ambulatory Visit (INDEPENDENT_AMBULATORY_CARE_PROVIDER_SITE_OTHER): Payer: BLUE CROSS/BLUE SHIELD | Admitting: Internal Medicine

## 2017-01-29 VITALS — BP 122/88 | HR 87 | Temp 98.5°F | Resp 16 | Ht 63.0 in | Wt 210.6 lb

## 2017-01-29 DIAGNOSIS — I10 Essential (primary) hypertension: Secondary | ICD-10-CM | POA: Diagnosis not present

## 2017-01-29 DIAGNOSIS — E1165 Type 2 diabetes mellitus with hyperglycemia: Secondary | ICD-10-CM

## 2017-01-29 DIAGNOSIS — E7800 Pure hypercholesterolemia, unspecified: Secondary | ICD-10-CM | POA: Diagnosis not present

## 2017-01-29 DIAGNOSIS — E669 Obesity, unspecified: Secondary | ICD-10-CM

## 2017-01-29 DIAGNOSIS — L0232 Furuncle of buttock: Secondary | ICD-10-CM

## 2017-01-29 DIAGNOSIS — E119 Type 2 diabetes mellitus without complications: Secondary | ICD-10-CM | POA: Diagnosis not present

## 2017-01-29 DIAGNOSIS — E1159 Type 2 diabetes mellitus with other circulatory complications: Secondary | ICD-10-CM

## 2017-01-29 DIAGNOSIS — IMO0001 Reserved for inherently not codable concepts without codable children: Secondary | ICD-10-CM

## 2017-01-29 DIAGNOSIS — E1169 Type 2 diabetes mellitus with other specified complication: Secondary | ICD-10-CM

## 2017-01-29 MED ORDER — METFORMIN HCL 500 MG PO TABS
500.0000 mg | ORAL_TABLET | Freq: Two times a day (BID) | ORAL | 3 refills | Status: DC
Start: 2017-01-29 — End: 2017-09-18

## 2017-01-29 MED ORDER — LOSARTAN POTASSIUM-HCTZ 100-25 MG PO TABS: 1.0000 | ORAL_TABLET | Freq: Every day | ORAL | 2 refills | Status: DC

## 2017-01-29 MED ORDER — DOXYCYCLINE HYCLATE 100 MG PO TABS: 100.0000 mg | ORAL_TABLET | Freq: Two times a day (BID) | ORAL | 0 refills | Status: DC

## 2017-01-29 NOTE — Patient Instructions (Signed)
You need to apply hot washcloth for 15 minutes every 8 hours ,  And take the antibiotic twice daily for ten days with food about 12 hours apart.   If the herpes test is positive,  I will add acyclovir  for a week .    Your diabetes is better controlled but not at goal yet  I recommend that you start taking metformin 500 mg twice daily to help lower your blood sugars  You need to find lower carb breads,  tyr the Double Fiber Whole Wheat bread  Instead of white bread,  "honey wheat"  (or see th pictures below for flatbreads and pita breads  There are low carb "on the go" breakfast drinks and bars:  1) try the premixed protein drinks (Atkins, AdvantEdge and the best tasting , highest protein one available is called " Premier Protein"; it is advocated by the bariatric surgeons for their patients and  available of < $2 serving at [facility] and  In bulk for $1.50/serving at [employer] and [employer]  .    Nutritional analysis :  160 cal  30 g protein  1 g sugar 50% calcium needs   Nicolette Bang and [employer]   2) There are plenty of high protein  HIGH  carb cookies,  But only the following are low carb."  Look for them in the diet section of most grocery stores or vitamin shops,  where the protein shakes are  sold.   All of these have 5 g sugar varieties if you read the label  : Power crunch Atkins bars KIND : make sure you find the  "low glycemic index"  variety QUEST : (taste better after being microwaved for 5 sec OUT OF THE WRAPPER)   .  To make a low carb chip :  Take the Joseph's Lavash or Pita bread,  Or the [place] Low carb whole wheat tortilla   Place on metal cookie sheet  Brush with olive oil  Sprinkle garlic powder (NOT garlic salt), grated parmesan cheese, mediterranean seasoning , or all of them?  Bake at 275 for 30 minutes   We have substitutions for your potatoes!!  Try the mashed cauliflower and riced cauliflower dishes instead of rice and mashed potatoes  Mashed turnips are  also very low carb!   For desserts :  Try the Dannon Lt n Fit greek yogurt dessert flavors and top with reddi Whip .  8 carbs,  80 calories  Try Oikos Triple Zero [place] Yogurt in the salted caramel, and the coffee flavors  With Whipped Cream for dessert  breyer's low carb ice cream, available in bars (on a stick, better ) or scoopable ice cream  HERE ARE THE LOW CARB  BREAD CHOICES

## 2017-01-29 NOTE — Progress Notes (Signed)
Subjective:  Patient ID: Katelyn Proctor, female    DOB: July 28, 1960  Age: 56 y.o. MRN: [id number]  CC: The primary encounter diagnosis was Boil of buttock. Diagnoses of Essential hypertension, Obesity, diabetes, and hypertension syndrome (HCC), Pure hypercholesterolemia, Uncontrolled type 2 diabetes mellitus without complication, without long-term current use of insulin (HCC), Obesity, morbid, BMI 40.0-49.9 (HCC), and Furuncle of buttock were also pertinent to this visit.  HPI Katelyn Proctor presents for 3 month follow up on uncontrolled.diabetes.  Patient had been lost to follow up for nearly two years,  A1c was 12.2 in June 2018.  She was prescribed  Levemir 20 units daily  And metformin  in mid June and asked to follow up in one month to review blood sugars.  However, she declined medication, choosing to monitor her blood sugars starting a walking program. She has given up bread,  Potato chips and ice cream. She has lost 12 lbs since June.  She has a goal weight loss of 40 lbs.  She is walking 2 [place] daily 5 days per week.    She has multiple  complaints today. Still suffering from a boil on her buttock that has never completely resolved despite taking the antibiotic prescribed 3 months ago.  The boil improves but is becomes enflamed when she perspires during exercise.   Patient has not had an eye exam in the last 12 months and does not check feet regularly for signs of infection.  Patient occasionally walks barefoot outside,  And denies any numbness tingling or burning in feet. Patient is up to date on all recommended vaccinations  Lab Results  Component Value Date   HGBA1C 7.9 (H) 01/28/2017     Outpatient Medications Prior to Visit  Medication Sig Dispense Refill  . aspirin EC 81 MG tablet Take 1 tablet (81 mg total) by mouth daily. 30 tablet 11  . fluticasone (FLONASE) 50 MCG/ACT nasal spray Place 2 sprays into both nostrils daily. 48 g 3  . glucose blood test strip Use as instructed  100 each 12  . INS SYRINGE/NEEDLE 1CC/28G (B-D INSULIN SYRINGE 1CC/28G) 28G X 1/2" 1 ML MISC 1 Syringe by Does not apply route daily after supper. 100 each 0  . Lancets (ONETOUCH ULTRASOFT) lancets Use as instructed 100 each 12  . ONETOUCH DELICA LANCETS FINE MISC To test blood sugar 3 times daily 100 each 6  . Vitamin D, Cholecalciferol, 1000 UNITS TABS Take 1 tablet by mouth daily.    Marland Kitchen losartan-hydrochlorothiazide (HYZAAR) 100-25 MG tablet Take 1 tablet by mouth daily. 90 tablet 0  . insulin detemir (LEVEMIR) 100 unit/ml SOLN Inject 0.2 mLs (20 Units total) into the skin at bedtime. (Patient not taking: Reported on 01/29/2017) 10 mL 1  . sulfamethoxazole-trimethoprim (BACTRIM DS,SEPTRA DS) 800-160 MG tablet Take 1 tablet by mouth 2 (two) times daily. (Patient not taking: Reported on 01/29/2017) 14 tablet 0   No facility-administered medications prior to visit.     Review of Systems;  Patient denies headache, fevers, malaise, unintentional weight loss, skin rash, eye pain, sinus congestion and sinus pain, sore throat, dysphagia,  hemoptysis , cough, dyspnea, wheezing, chest pain, palpitations, orthopnea, edema, abdominal pain, nausea, melena, diarrhea, constipation, flank pain, dysuria, hematuria, urinary  Frequency, nocturia, numbness, tingling, seizures,  Focal weakness, Loss of consciousness,  Tremor, insomnia, depression, anxiety, and suicidal ideation.      Objective:  BP 122/88 (BP Location: Left Arm, Patient Position: Sitting, Cuff Size: Normal)   Pulse 87  Temp 98.5 F (36.9 C) (Oral)   Resp 16   Ht  (1.6 m)   Wt 210 lb 9.6 oz (95.5 kg)   SpO2 98%   BMI 37.31 kg/m   BP Readings from Last 3 Encounters:  01/29/17 122/88  10/22/16 118/82  02/23/15 (!) 148/88    Wt Readings from Last 3 Encounters:  01/29/17 210 lb 9.6 oz (95.5 kg)  10/22/16 222 lb 3.2 oz (100.8 kg)  02/23/15 227 lb 8 oz (103.2 kg)    General appearance: alert, cooperative and appears stated  age Ears: normal TM's and external ear canals both ears Throat: lips, mucosa, and tongue normal; teeth and gums normal Neck: no adenopathy, no carotid bruit, supple, symmetrical, trachea midline and thyroid not enlarged, symmetric, no tenderness/mass/nodules Back: symmetric, no curvature. ROM normal. No CVA tenderness. Lungs: clear to auscultation bilaterally Heart: regular rate and rhythm, S1, S2 normal, no murmur, click, rub or gallop Abdomen: soft, non-tender; bowel sounds normal; no masses,  no organomegaly Pulses: 2+ and symmetric Skin: Skin color, texture, turgor normal. No rashes or lesions Lymph nodes: Cervical, supraclavicular, and axillary nodes normal.  Lab Results  Component Value Date   HGBA1C 7.9 (H) 01/28/2017   HGBA1C 12.2 (H) 10/22/2016   HGBA1C 6.9 (H) 01/12/2015    Lab Results  Component Value Date   CREATININE 0.66 01/28/2017   CREATININE 0.68 10/22/2016   CREATININE 0.70 01/12/2015    Lab Results  Component Value Date   WBC 8.0 10/22/2016   HGB 14.4 10/22/2016   HCT 41.7 10/22/2016   PLT 336.0 10/22/2016   GLUCOSE 134 (H) 01/28/2017   CHOL 196 01/28/2017   TRIG 61.0 01/28/2017   HDL 52.20 01/28/2017   LDLDIRECT 131.0 01/28/2017   LDLCALC 132 (H) 01/28/2017   ALT 11 01/28/2017   AST 11 01/28/2017   NA 138 01/28/2017   K 3.9 01/28/2017   CL 104 01/28/2017   CREATININE 0.66 01/28/2017   BUN 15 01/28/2017   CO2 27 01/28/2017   TSH 1.06 01/12/2015   HGBA1C 7.9 (H) 01/28/2017   MICROALBUR 0.7 01/29/2017    Mm Digital Screening Bilateral  Result Date: 01/22/2017 CLINICAL DATA:  Screening. EXAM: DIGITAL SCREENING BILATERAL MAMMOGRAM WITH CAD COMPARISON:  Previous exam(s). ACR Breast Density Category b: There are scattered areas of fibroglandular density. FINDINGS: There are no findings suspicious for malignancy. Images were processed with CAD. IMPRESSION: No mammographic evidence of malignancy. A result letter of this screening mammogram will be  mailed directly to the patient. RECOMMENDATION: Screening mammogram in one year. (Code:SM-B-01Y) BI-RADS CATEGORY  1: Negative. Electronically Signed   By: Ted Mcalpine M.D.   On: 01/22/2017 14:44    Assessment & Plan:   Problem List Items Addressed This Visit    Essential hypertension   Relevant Medications   losartan-hydrochlorothiazide (HYZAAR) 100-25 MG tablet   Furuncle of buttock    It has not resolved with empiric MRSA treatment using Septra DS.  Will repeat empiric therapy , this time with doxycycline.  Given its appearance,  Checking HSV antibody titers.        Hyperlipidemia    Improved with dietary restraint.   Lab Results  Component Value Date   CHOL 196 01/28/2017   HDL 52.20 01/28/2017   LDLCALC 132 (H) 01/28/2017   LDLDIRECT 131.0 01/28/2017   TRIG 61.0 01/28/2017   CHOLHDL 4 01/28/2017         Relevant Medications   losartan-hydrochlorothiazide (HYZAAR) 100-25 MG tablet  Obesity, diabetes, and hypertension syndrome (HCC)   Relevant Medications   losartan-hydrochlorothiazide (HYZAAR) 100-25 MG tablet   metFORMIN (GLUCOPHAGE) 500 MG tablet   Obesity, morbid, BMI 40.0-49.9 (HCC)    I have congratulated her in reduction of   BMI and encouraged  Continued weight loss with goal of 10% of body weigh over the next 6 months using a low glycemic index diet and regular exercise a minimum of 5 days per week.        Relevant Medications   metFORMIN (GLUCOPHAGE) 500 MG tablet   Uncontrolled type 2 diabetes mellitus (HCC)    She has made significant improvement with diet and exercise bu is not at goal.  Dietary restrictions outlined. I have again recommended starting metformin .  Lab Results  Component Value Date   HGBA1C 7.9 (H) 01/28/2017   Lab Results  Component Value Date   MICROALBUR 0.7 01/29/2017         Relevant Medications   losartan-hydrochlorothiazide (HYZAAR) 100-25 MG tablet   metFORMIN (GLUCOPHAGE) 500 MG tablet    Other Visit  Diagnoses    Boil of buttock    -  Primary   Relevant Orders   HSV(herpes simplex vrs) 1+2 ab-IgM   HSV(herpes simplex vrs) 1+2 ab-IgG      I have discontinued Ms. Sherley's sulfamethoxazole-trimethoprim. I am also having her start on doxycycline and metFORMIN. Additionally, I am having her maintain her aspirin EC, Vitamin D (Cholecalciferol), ONETOUCH DELICA LANCETS FINE, fluticasone, insulin detemir, INS SYRINGE/NEEDLE 1CC/28G, glucose blood, onetouch ultrasoft, and losartan-hydrochlorothiazide.  Meds ordered this encounter  Medications  . doxycycline (VIBRA-TABS) 100 MG tablet    Sig: Take 1 tablet (100 mg total) by mouth 2 (two) times daily.    Dispense:  20 tablet    Refill:  0  . losartan-hydrochlorothiazide (HYZAAR) 100-25 MG tablet    Sig: Take 1 tablet by mouth daily.    Dispense:  90 tablet    Refill:  2  . metFORMIN (GLUCOPHAGE) 500 MG tablet    Sig: Take 1 tablet (500 mg total) by mouth 2 (two) times daily with a meal.    Dispense:  180 tablet    Refill:  3    Medications Discontinued During This Encounter  Medication Reason  . sulfamethoxazole-trimethoprim (BACTRIM DS,SEPTRA DS) 800-160 MG tablet Completed Course  . losartan-hydrochlorothiazide (HYZAAR) 100-25 MG tablet Reorder    Follow-up: Return in about 3 months (around 04/30/2017) for follow up diabetes.   Sherlene Shams, MD

## 2017-01-30 ENCOUNTER — Other Ambulatory Visit (INDEPENDENT_AMBULATORY_CARE_PROVIDER_SITE_OTHER): Payer: BLUE CROSS/BLUE SHIELD

## 2017-01-30 DIAGNOSIS — R894 Abnormal immunological findings in specimens from other organs, systems and tissues: Secondary | ICD-10-CM

## 2017-01-30 DIAGNOSIS — L0232 Furuncle of buttock: Secondary | ICD-10-CM

## 2017-01-30 LAB — MICROALBUMIN / CREATININE URINE RATIO
Creatinine,U: 162.7 mg/dL
MICROALB/CREAT RATIO: 0.4 mg/g (ref 0.0–30.0)
Microalb, Ur: 0.7 mg/dL (ref 0.0–1.9)

## 2017-01-31 NOTE — Assessment & Plan Note (Signed)
I have congratulated her in reduction of   BMI and encouraged  Continued weight loss with goal of 10% of body weigh over the next 6 months using a low glycemic index diet and regular exercise a minimum of 5 days per week.    

## 2017-01-31 NOTE — Assessment & Plan Note (Addendum)
She has made significant improvement with diet and exercise bu is not at goal.  Dietary restrictions outlined. I have again recommended starting metformin .  Lab Results  Component Value Date   HGBA1C 7.9 (H) 01/28/2017   Lab Results  Component Value Date   MICROALBUR 0.7 01/29/2017

## 2017-01-31 NOTE — Assessment & Plan Note (Signed)
Improved with dietary restraint.   Lab Results  Component Value Date   CHOL 196 01/28/2017   HDL 52.20 01/28/2017   LDLCALC 132 (H) 01/28/2017   LDLDIRECT 131.0 01/28/2017   TRIG 61.0 01/28/2017   CHOLHDL 4 01/28/2017

## 2017-01-31 NOTE — Assessment & Plan Note (Signed)
It has not resolved with empiric MRSA treatment using Septra DS.  Will repeat empiric therapy , this time with doxycycline.  Given its appearance,  Checking HSV antibody titers.

## 2017-02-01 DIAGNOSIS — R894 Abnormal immunological findings in specimens from other organs, systems and tissues: Secondary | ICD-10-CM | POA: Insufficient documentation

## 2017-02-01 LAB — HSV(HERPES SIMPLEX VRS) I + II AB-IGG
HSV 1 Glycoprotein G Ab, IgG: 26.7 index — ABNORMAL HIGH (ref 0.00–0.90)
HSV 2 IGG, TYPE SPEC: 3.51 {index} — AB (ref 0.00–0.90)

## 2017-02-01 LAB — HSV-2 IGG SUPPLEMENTAL TEST: HSV-2 IGG SUPPLEMENTAL TEST: POSITIVE — AB

## 2017-02-01 LAB — HSV(HERPES SIMPLEX VRS) I + II AB-IGM: HSVI/II Comb IgM: <0.91 Ratio (ref 0.00–0.90)

## 2017-02-01 MED ORDER — ACYCLOVIR 400 MG PO TABS: 400.0000 mg | ORAL_TABLET | Freq: Every day | ORAL | 1 refills | Status: DC

## 2017-02-01 NOTE — Assessment & Plan Note (Signed)
Given the persistent nonhealing ulcer on her buttock.  Will add empiric valacyclovir.

## 2017-02-01 NOTE — Addendum Note (Signed)
Addended by: Sherlene Shams on: 02/01/2017 01:30 PM   Modules accepted: Orders

## 2017-02-04 ENCOUNTER — Telehealth: Payer: Self-pay | Admitting: Internal Medicine

## 2017-02-04 NOTE — Telephone Encounter (Signed)
Pt called about a antibiotic that she just picked up from the pharmacy she has a question?  Call pt @ [phone]. acyclovir (ZOVIRAX) 400 MG tablet

## 2017-02-05 NOTE — Telephone Encounter (Signed)
Called pt and she stated that she picked up her acyclovir and it stated to take it five times daily. The pt was wondering how she was to take the medication five times in a day. I explained to her that she would take one at breakfast, lunch and dinner and then take one mid morning and mid afternoon. Pt gave a verbal understanding.

## 2017-03-25 ENCOUNTER — Telehealth: Payer: Self-pay | Admitting: Internal Medicine

## 2017-03-25 MED ORDER — GLUCOSE BLOOD VI STRP: ORAL_STRIP | 12 refills | Status: DC

## 2017-03-25 NOTE — Telephone Encounter (Signed)
  Patient stated her CBG's are not high that she cannot get her Glucometer to work that her test strips are not working properly sent in new test strips for patient.           Copied from CRM [phone]. Topic: Inquiry >> Mar 25, 2017  9:30 AM Anice PaganiniMunoz, Cruz I, NT wrote: Reason for CRM: Pt Need Doctor Darrick Huntsmanullo Nurse to call her back She said her Blood sugar is hi,

## 2017-04-02 ENCOUNTER — Telehealth: Payer: Self-pay | Admitting: Internal Medicine

## 2017-04-02 NOTE — Telephone Encounter (Unsigned)
Copied from CRM #[id number]. Topic: Inquiry >> Apr 02, 2017  2:31 PM Raquel SarnaHayes, Teresa G wrote: Office sent strips with pt for old meter  Pt was given a new meter - needs help with setting new meter up and is asking if she should use the strips for the old meter (pt was told to throw the old one away)

## 2017-04-02 NOTE — Telephone Encounter (Signed)
Please schedule patient for a nurse visit

## 2017-04-30 ENCOUNTER — Ambulatory Visit: Payer: BLUE CROSS/BLUE SHIELD | Admitting: Internal Medicine

## 2017-05-08 ENCOUNTER — Telehealth: Payer: Self-pay | Admitting: Internal Medicine

## 2017-05-08 DIAGNOSIS — I10 Essential (primary) hypertension: Secondary | ICD-10-CM

## 2017-05-08 MED ORDER — LOSARTAN POTASSIUM-HCTZ 100-25 MG PO TABS: 1.0000 | ORAL_TABLET | Freq: Every day | ORAL | 2 refills | Status: DC

## 2017-05-08 NOTE — Telephone Encounter (Signed)
Copied from CRM [phone]. Topic: Quick Communication - See Telephone Encounter >> May 08, 2017 10:48 AM Arlyss Gandyichardson, Natajah Derderian N, NT wrote: CRM for notification. See Telephone encounter for: Pt needs refill of  losartan-hydrochlorothiazide sent to [facility].  05/08/17.

## 2017-05-08 NOTE — Telephone Encounter (Signed)
Pharmacy changed to [facility].

## 2017-08-06 ENCOUNTER — Telehealth: Payer: Self-pay | Admitting: Internal Medicine

## 2017-08-06 NOTE — Telephone Encounter (Signed)
Copied from CRM [phone]. Topic: Quick Communication - Rx Refill/Question >> Aug 06, 2017  2:49 PM Arlyss Gandyichardson, Yahmir Sokolov N, NT wrote: Medication: losartan-hydrochlorothiazide Mauri Reading(HYZAAR) Has the patient contacted their pharmacy? Yes.   (Agent: If no, request that the patient contact the pharmacy for the refill.) Preferred Pharmacy (with phone number or street name): [facility]  Agent: Please be advised that RX refills may take up to 3 business days. We ask that you follow-up with your pharmacy.

## 2017-08-06 NOTE — Telephone Encounter (Signed)
Patient called to inform that the losartan-hydrochlorothiazide was sent to [facility] in December, 2018 90 day/2 refills. She says "so I just call my pharmacy?" I advised yes, call the pharmacy and if the supply she has will run out before the mail order arrives, to call back to the office to request a supply to be sent to a local pharmacy, she verbalized understanding.

## 2017-09-18 ENCOUNTER — Ambulatory Visit (INDEPENDENT_AMBULATORY_CARE_PROVIDER_SITE_OTHER): Payer: BLUE CROSS/BLUE SHIELD | Admitting: Internal Medicine

## 2017-09-18 VITALS — BP 122/72 | HR 82 | Temp 98.2°F | Resp 15 | Ht 63.0 in | Wt 216.8 lb

## 2017-09-18 DIAGNOSIS — E669 Obesity, unspecified: Secondary | ICD-10-CM | POA: Diagnosis not present

## 2017-09-18 DIAGNOSIS — Z1211 Encounter for screening for malignant neoplasm of colon: Secondary | ICD-10-CM | POA: Diagnosis not present

## 2017-09-18 DIAGNOSIS — E7800 Pure hypercholesterolemia, unspecified: Secondary | ICD-10-CM

## 2017-09-18 DIAGNOSIS — E1165 Type 2 diabetes mellitus with hyperglycemia: Secondary | ICD-10-CM | POA: Diagnosis not present

## 2017-09-18 DIAGNOSIS — E119 Type 2 diabetes mellitus without complications: Secondary | ICD-10-CM

## 2017-09-18 DIAGNOSIS — E1169 Type 2 diabetes mellitus with other specified complication: Secondary | ICD-10-CM

## 2017-09-18 DIAGNOSIS — I10 Essential (primary) hypertension: Secondary | ICD-10-CM

## 2017-09-18 DIAGNOSIS — Z720 Tobacco use: Secondary | ICD-10-CM

## 2017-09-18 DIAGNOSIS — E785 Hyperlipidemia, unspecified: Secondary | ICD-10-CM | POA: Diagnosis not present

## 2017-09-18 DIAGNOSIS — E1159 Type 2 diabetes mellitus with other circulatory complications: Secondary | ICD-10-CM

## 2017-09-18 DIAGNOSIS — L732 Hidradenitis suppurativa: Secondary | ICD-10-CM

## 2017-09-18 DIAGNOSIS — Z0000 Encounter for general adult medical examination without abnormal findings: Secondary | ICD-10-CM | POA: Diagnosis not present

## 2017-09-18 LAB — COMPREHENSIVE METABOLIC PANEL
ALBUMIN: 3.9 g/dL (ref 3.5–5.2)
ALK PHOS: 84 U/L (ref 39–117)
ALT: 11 U/L (ref 0–35)
AST: 12 U/L (ref 0–37)
BUN: 17 mg/dL (ref 6–23)
CALCIUM: 9.5 mg/dL (ref 8.4–10.5)
CO2: 27 mEq/L (ref 19–32)
Chloride: 104 mEq/L (ref 96–112)
Creatinine, Ser: 0.64 mg/dL (ref 0.40–1.20)
GFR: 122.52 mL/min (ref >60.00)
GLUCOSE: 171 mg/dL — AB (ref 70–99)
POTASSIUM: 3.8 meq/L (ref 3.5–5.1)
Sodium: 138 mEq/L (ref 135–145)
TOTAL PROTEIN: 7.4 g/dL (ref 6.0–8.3)
Total Bilirubin: 0.4 mg/dL (ref 0.2–1.2)

## 2017-09-18 LAB — POCT GLYCOSYLATED HEMOGLOBIN (HGB A1C): HEMOGLOBIN A1C: 9

## 2017-09-18 LAB — LIPID PANEL
CHOLESTEROL: 218 mg/dL — AB (ref 0–200)
HDL: 50.1 mg/dL (ref >39.00)
LDL Cholesterol: 153 mg/dL — ABNORMAL HIGH (ref 0–99)
NonHDL: 167.6
Total CHOL/HDL Ratio: 4
Triglycerides: 75 mg/dL (ref 0.0–149.0)
VLDL: 15 mg/dL (ref 0.0–40.0)

## 2017-09-18 LAB — MICROALBUMIN / CREATININE URINE RATIO
CREATININE, U: 113.3 mg/dL
MICROALB UR: 0.7 mg/dL (ref 0.0–1.9)
MICROALB/CREAT RATIO: 0.6 mg/g (ref 0.0–30.0)

## 2017-09-18 LAB — LDL CHOLESTEROL, DIRECT: Direct LDL: 160 mg/dL

## 2017-09-18 MED ORDER — METFORMIN HCL ER 500 MG PO TB24: 500.0000 mg | ORAL_TABLET | Freq: Every day | ORAL | 1 refills | Status: DC

## 2017-09-18 MED ORDER — SULFAMETHOXAZOLE-TRIMETHOPRIM 800-160 MG PO TABS: 1.0000 | ORAL_TABLET | Freq: Two times a day (BID) | ORAL | 0 refills | Status: DC

## 2017-09-18 MED ORDER — GLIPIZIDE 5 MG PO TABS: 5.0000 mg | ORAL_TABLET | Freq: Two times a day (BID) | ORAL | 3 refills | Status: DC

## 2017-09-18 NOTE — Patient Instructions (Signed)
I am starting you on glipizide 5 mg twice daily before breakfast and dinner   Continue metformin,  But I am changing metformin to the XR (extended release( form) to manage the loose stools   Goal fasting ("before) sugars is 124 or less   Goal "after) BS is 160 or less   Septra Ds twice daily for 7 days to treat skin infection  Use gold bond powder with zince in skin folds to prevent infection   RTC 3 months

## 2017-09-18 NOTE — Progress Notes (Signed)
Subjective:  Patient ID: Katelyn Proctor, female    DOB: February 11, 1961  Age: 57 y.o. MRN: [id number]  CC: The primary encounter diagnosis was Obesity, diabetes, and hypertension syndrome (HCC). Diagnoses of Hyperlipidemia associated with type 2 diabetes mellitus (HCC), Uncontrolled type 2 diabetes mellitus with hyperglycemia (HCC), Hidradenitis suppurativa, Essential hypertension, Tobacco abuse, Screening for colon cancer, and Encounter for preventive health examination were also pertinent to this visit.  HPI Katelyn Proctor presents for  follow up on obesity, diabetes and hypertension.  She was last seen in September  Cc:  Recurrent skin infection in her groin. Feels they are brought on by sweating.  Has been walking for exercise on average 5 days per week  Averages 3 [place] daily.  Last treated  in Sept with doxycycline . Treated prior to September In June with Septra Ds.  States that the infection has never completely resolved.  Denies fevers.  Current boil has been draining.   Uncontrolled Type 2 DM.  Patient historically refused and continues to refuse  to consider insulin therapy.  Never started the Levemir after last visit.  Taking metformin only and having loose stools.   a1c now 9.0.  Has been checking sugars    Twice daily .  Fasting sugars 180  To 220.  Post prandial 149 to 200  Has reduced tobacco use to use only at home 7 cigs daily    Lab Results  Component Value Date   HGBA1C 9.0 09/18/2017   Lab Results  Component Value Date   MICROALBUR 0.7 09/18/2017     Outpatient Medications Prior to Visit  Medication Sig Dispense Refill  . aspirin EC 81 MG tablet Take 1 tablet (81 mg total) by mouth daily. 30 tablet 11  . fluticasone (FLONASE) 50 MCG/ACT nasal spray Place 2 sprays into both nostrils daily. 48 g 3  . glucose blood test strip Use as instructed 100 each 12  . Lancets (ONETOUCH ULTRASOFT) lancets Use as instructed 100 each 12  . losartan-hydrochlorothiazide (HYZAAR)  100-25 MG tablet Take 1 tablet by mouth daily. 90 tablet 2  . ONETOUCH DELICA LANCETS FINE MISC To test blood sugar 3 times daily 100 each 6  . Vitamin D, Cholecalciferol, 1000 UNITS TABS Take 1 tablet by mouth daily.    . metFORMIN (GLUCOPHAGE) 500 MG tablet Take 1 tablet (500 mg total) by mouth 2 (two) times daily with a meal. 180 tablet 3  . acyclovir (ZOVIRAX) 400 MG tablet Take 1 tablet (400 mg total) by mouth 5 (five) times daily. (Patient not taking: Reported on 09/18/2017) 35 tablet 1  . doxycycline (VIBRA-TABS) 100 MG tablet Take 1 tablet (100 mg total) by mouth 2 (two) times daily. (Patient not taking: Reported on 09/18/2017) 20 tablet 0  . INS SYRINGE/NEEDLE 1CC/28G (B-D INSULIN SYRINGE 1CC/28G) 28G X 1/2" 1 ML MISC 1 Syringe by Does not apply route daily after supper. (Patient not taking: Reported on 09/18/2017) 100 each 0  . insulin detemir (LEVEMIR) 100 unit/ml SOLN Inject 0.2 mLs (20 Units total) into the skin at bedtime. (Patient not taking: Reported on 01/29/2017) 10 mL 1   No facility-administered medications prior to visit.     Review of Systems;  Patient denies headache, fevers, malaise, unintentional weight loss, skin rash, eye pain, sinus congestion and sinus pain, sore throat, dysphagia,  hemoptysis , cough, dyspnea, wheezing, chest pain, palpitations, orthopnea, edema, abdominal pain, nausea, melena, diarrhea, constipation, flank pain, dysuria, hematuria, urinary  Frequency, nocturia, numbness, tingling, seizures,  Focal weakness, Loss of consciousness,  Tremor, insomnia, depression, anxiety, and suicidal ideation.      Objective:  BP 122/72 (BP Location: Left Arm, Patient Position: Sitting, Cuff Size: Large)   Pulse 82   Temp 98.2 F (36.8 C) (Oral)   Resp 15   Ht  (1.6 m)   Wt 216 lb 12.8 oz (98.3 kg)   SpO2 95%   BMI 38.40 kg/m   BP Readings from Last 3 Encounters:  09/18/17 122/72  01/29/17 122/88  10/22/16 118/82    Wt Readings from Last 3 Encounters:    09/18/17 216 lb 12.8 oz (98.3 kg)  01/29/17 210 lb 9.6 oz (95.5 kg)  10/22/16 222 lb 3.2 oz (100.8 kg)    General appearance: alert, cooperative and appears stated age Ears: normal TM's and external ear canals both ears Throat: lips, mucosa, and tongue normal; teeth and gums normal Neck: no adenopathy, no carotid bruit, supple, symmetrical, trachea midline and thyroid not enlarged, symmetric, no tenderness/mass/nodules Back: symmetric, no curvature. ROM normal. No CVA tenderness. Lungs: clear to auscultation bilaterally Heart: regular rate and rhythm, S1, S2 normal, no murmur, click, rub or gallop Abdomen: soft, non-tender; bowel sounds normal; no masses,  no organomegaly Pulses: 2+ and symmetric Skin: Skin color, texture, turgor normal. No rashes or lesions Lymph nodes: Cervical, supraclavicular, and axillary nodes normal.  Lab Results  Component Value Date   HGBA1C 9.0 09/18/2017   HGBA1C 7.9 (H) 01/28/2017   HGBA1C 12.2 (H) 10/22/2016    Lab Results  Component Value Date   CREATININE 0.64 09/18/2017   CREATININE 0.66 01/28/2017   CREATININE 0.68 10/22/2016    Lab Results  Component Value Date   WBC 8.0 10/22/2016   HGB 14.4 10/22/2016   HCT 41.7 10/22/2016   PLT 336.0 10/22/2016   GLUCOSE 171 (H) 09/18/2017   CHOL 218 (H) 09/18/2017   TRIG 75.0 09/18/2017   HDL 50.10 09/18/2017   LDLDIRECT 160.0 09/18/2017   LDLCALC 153 (H) 09/18/2017   ALT 11 09/18/2017   AST 12 09/18/2017   NA 138 09/18/2017   K 3.8 09/18/2017   CL 104 09/18/2017   CREATININE 0.64 09/18/2017   BUN 17 09/18/2017   CO2 27 09/18/2017   TSH 1.06 01/12/2015   HGBA1C 9.0 09/18/2017   MICROALBUR 0.7 09/18/2017    Mm Digital Screening Bilateral  Result Date: 01/22/2017 CLINICAL DATA:  Screening. EXAM: DIGITAL SCREENING BILATERAL MAMMOGRAM WITH CAD COMPARISON:  Previous exam(s). ACR Breast Density Category b: There are scattered areas of fibroglandular density. FINDINGS: There are no findings  suspicious for malignancy. Images were processed with CAD. IMPRESSION: No mammographic evidence of malignancy. A result letter of this screening mammogram will be mailed directly to the patient. RECOMMENDATION: Screening mammogram in one year. (Code:[id number]-B-01Y) BI-RADS CATEGORY  1: Negative. Electronically Signed   By: Ted Mcalpine M.D.   On: 01/22/2017 14:44    Assessment & Plan:   Problem List Items Addressed This Visit    Obesity, diabetes, and hypertension syndrome (HCC) - Primary   Relevant Medications   glipiZIDE (GLUCOTROL) 5 MG tablet   metFORMIN (GLUCOPHAGE-XR) 500 MG 24 hr tablet   Other Relevant Orders   POCT HgB A1C (Completed)   Comprehensive metabolic panel (Completed)   Lipid panel (Completed)   Microalbumin / creatinine urine ratio (Completed)   Uncontrolled type 2 diabetes mellitus (HCC)    Uncontrolled due to lack of follow up and refusal to use insulin.  Metformin changed to XR,  Adding glipizide 5 mg bid. Continue daily use of baby aspirin for primary prevention of CAD given concurrent hypertension and ongoing tobacco abuse   Lab Results  Component Value Date   HGBA1C 9.0 09/18/2017   Lab Results  Component Value Date   MICROALBUR 0.7 09/18/2017         Relevant Medications   glipiZIDE (GLUCOTROL) 5 MG tablet   metFORMIN (GLUCOPHAGE-XR) 500 MG 24 hr tablet   Tobacco abuse    Spent 3 minutes discussing risk of continued tobacco abuse, including but not limited to CAD, PAD, hypertension, and CA.  She is not interested in pharmacotherapy at this time and has reduced her use to 7 cigs daily..Smoking cessation instruction/counseling given: commended patient for reducing daily use. And encouraged  Patient to continue reduction in daily use by 1 cigarette every week      Screening for colon cancer   Hidradenitis suppurativa    Historically affecting axillary , inguinal and buttock areas.  Persistent infection aggravated by uncontrolled diabetes.  Septra Ds  prescribed.       Relevant Medications   sulfamethoxazole-trimethoprim (BACTRIM DS,SEPTRA DS) 800-160 MG tablet   Essential hypertension    Well controlled on current regimen of losartan/hct. Renal function is normal.  No changes today  Lab Results  Component Value Date   CREATININE 0.64 09/18/2017   Lab Results  Component Value Date   MICROALBUR 0.7 09/18/2017   Lab Results  Component Value Date   NA 138 09/18/2017   K 3.8 09/18/2017   CL 104 09/18/2017   CO2 27 09/18/2017         Encounter for preventive health examination    She is overdue for CPE.  Next visit will include PAP smear and referral for colonoscopy       Other Visit Diagnoses    Hyperlipidemia associated with type 2 diabetes mellitus (HCC)       Relevant Medications   glipiZIDE (GLUCOTROL) 5 MG tablet   metFORMIN (GLUCOPHAGE-XR) 500 MG 24 hr tablet   Other Relevant Orders   LDL cholesterol, direct (Completed)     A total of 40 minutes was spent with patient more than half of which was spent in counseling patient on the above mentioned issues , reviewing and explaining recent labs , and coordination of care.  I have discontinued Hanalei D. Enriquez's insulin detemir, INS SYRINGE/NEEDLE 1CC/28G, doxycycline, metFORMIN, and acyclovir. I am also having her start on glipiZIDE, metFORMIN, and sulfamethoxazole-trimethoprim. Additionally, I am having her maintain her aspirin EC, Vitamin D (Cholecalciferol), ONETOUCH DELICA LANCETS FINE, fluticasone, onetouch ultrasoft, glucose blood, and losartan-hydrochlorothiazide.  Meds ordered this encounter  Medications  . glipiZIDE (GLUCOTROL) 5 MG tablet    Sig: Take 1 tablet (5 mg total) by mouth 2 (two) times daily before a meal. Before breakfast and dinner    Dispense:  180 tablet    Refill:  3  . metFORMIN (GLUCOPHAGE-XR) 500 MG 24 hr tablet    Sig: Take 1 tablet (500 mg total) by mouth daily with breakfast.    Dispense:  90 tablet    Refill:  1  .  sulfamethoxazole-trimethoprim (BACTRIM DS,SEPTRA DS) 800-160 MG tablet    Sig: Take 1 tablet by mouth 2 (two) times daily.    Dispense:  14 tablet    Refill:  0   A total of 25 minutes of face to face time was spent with patient more than half of which was spent in counselling about the above mentioned  conditions  and coordination of care   Medications Discontinued During This Encounter  Medication Reason  . acyclovir (ZOVIRAX) 400 MG tablet Completed Course  . doxycycline (VIBRA-TABS) 100 MG tablet Completed Course  . INS SYRINGE/NEEDLE 1CC/28G (B-D INSULIN SYRINGE 1CC/28G) 28G X 1/2" 1 ML MISC Patient has not taken in last 30 days  . metFORMIN (GLUCOPHAGE) 500 MG tablet   . insulin detemir (LEVEMIR) 100 unit/ml SOLN     Follow-up: Return in about 3 months (around 12/19/2017) for follow up diabetes.   Sherlene Shams, MD

## 2017-09-21 DIAGNOSIS — Z1211 Encounter for screening for malignant neoplasm of colon: Secondary | ICD-10-CM | POA: Insufficient documentation

## 2017-09-21 NOTE — Assessment & Plan Note (Signed)
her risk of heart attack over the next ten years is about 26% using the FRC.  I am recommending that she start taking Lipitor to lower her risk and to stop smoking .  If she agrees I will  Send lipitor to her pharmacy   Lab Results  Component Value Date   CHOL 218 (H) 09/18/2017   HDL 50.10 09/18/2017   LDLCALC 153 (H) 09/18/2017   LDLDIRECT 160.0 09/18/2017   TRIG 75.0 09/18/2017   CHOLHDL 4 09/18/2017

## 2017-09-21 NOTE — Assessment & Plan Note (Signed)
Spent 3 minutes discussing risk of continued tobacco abuse, including but not limited to CAD, PAD, hypertension, and CA.  She is not interested in pharmacotherapy at this time and has reduced her use to 7 cigs daily..Smoking cessation instruction/counseling given: commended patient for reducing daily use. And encouraged  Patient to continue reduction in daily use by 1 cigarette every week

## 2017-09-21 NOTE — Assessment & Plan Note (Signed)
Historically affecting axillary , inguinal and buttock areas.  Persistent infection aggravated by uncontrolled diabetes.  Septra Ds prescribed.

## 2017-09-21 NOTE — Assessment & Plan Note (Signed)
She is overdue for CPE.  Next visit will include PAP smear and referral for colonoscopy

## 2017-09-21 NOTE — Assessment & Plan Note (Signed)
Well controlled on current regimen of losartan/hct. Renal function is normal.  No changes today  Lab Results  Component Value Date   CREATININE 0.64 09/18/2017   Lab Results  Component Value Date   MICROALBUR 0.7 09/18/2017   Lab Results  Component Value Date   NA 138 09/18/2017   K 3.8 09/18/2017   CL 104 09/18/2017   CO2 27 09/18/2017

## 2017-09-21 NOTE — Assessment & Plan Note (Addendum)
Uncontrolled due to lack of follow up and refusal to use insulin.  Metformin changed to XR,  Adding glipizide 5 mg bid. Continue daily use of baby aspirin for primary prevention of CAD given concurrent hypertension and ongoing tobacco abuse   Lab Results  Component Value Date   HGBA1C 9.0 09/18/2017   Lab Results  Component Value Date   MICROALBUR 0.7 09/18/2017

## 2017-09-25 ENCOUNTER — Other Ambulatory Visit: Payer: Self-pay | Admitting: Internal Medicine

## 2017-09-25 DIAGNOSIS — E1165 Type 2 diabetes mellitus with hyperglycemia: Secondary | ICD-10-CM

## 2017-09-25 DIAGNOSIS — E7800 Pure hypercholesterolemia, unspecified: Secondary | ICD-10-CM

## 2017-09-25 MED ORDER — ATORVASTATIN CALCIUM 20 MG PO TABS: 20.0000 mg | ORAL_TABLET | Freq: Every day | ORAL | 3 refills | Status: DC

## 2017-09-25 NOTE — Progress Notes (Signed)
[facility]

## 2017-09-30 ENCOUNTER — Ambulatory Visit: Payer: Self-pay | Admitting: *Deleted

## 2017-09-30 NOTE — Telephone Encounter (Signed)
Patient was triaged please see note below she just completed bactrium but is concerned with the new medications she has started, metformin, glipizide and atorvastatin. Patient states the rash did not start until after taking the last dose of bactrum but is wondering if the new medications could have something to do with the rash. No other complaints Please advise

## 2017-09-30 NOTE — Telephone Encounter (Signed)
Pt is having a reaction to medicine - she is itching on her back and legs. It si turninf red, and a knot is forming. Not on her face, only back and legs.  Please call [phone] Patient was in the office and she was given 4 medications- she has had a reaction to one of them. Patient complains of severe itching and rash. Patient works 3rd shift and she has to have an early morning appointment.  Patient declines 9:45 appointment - or appointment at another office- advised to go to [facility] as soon as she can- take Benadryl and push fluids. Will send information to PCP for medication review- patient wants to know what to do about her medications- Metformin, glipizide, and atoravastin. Patient may be reached anytime after 2:00 today.    Reason for Disposition . Hives or itching  Answer Assessment - Initial Assessment Questions 1. APPEARANCE: "What does the rash look like?"      Itching- then rash appears 2. LOCATION: "Where is the rash located?"      All over- legs, buttocks, under arm, torso 3. NUMBER: "How many hives are there?"      All over 4. SIZE: "How big are the hives?" (inches, cm, compare to coins) "Do they all look the same or is there lots of variation in shape and size?"      Look like mosquito bites 5. ONSET: "When did the hives begin?" (Hours or days ago)      Started today 6. ITCHING: "Does it itch?" If so, ask: "How bad is the itch?"    - MILD: doesn't interfere with normal activities   - MODERATE-SEVERE: interferes with work, school, sleep, or other activities      Moderate- severe 7. RECURRENT PROBLEM: "Have you had hives before?" If so, ask: "When was the last time?" and "What happened that time?"      never 8. TRIGGERS: "Were you exposed to any new food, plant, cosmetic product or animal just before the hives began?"     New medication 9. OTHER SYMPTOMS: "Do you have any other symptoms?" (e.g., fever, tongue swelling, difficulty breathing, abdominal pain)     no 10.  PREGNANCY: "Is there any chance you are pregnant?" "When was your last menstrual period?"       No- menopausal  Protocols used: RASH - WIDESPREAD ON DRUGS-A-AH, HIVES-A-AH

## 2017-09-30 NOTE — Telephone Encounter (Signed)
Spoke with pt and informed her of Dr. Melina Schools message and directions stated below. Pt gave a verbal understanding.

## 2017-09-30 NOTE — Telephone Encounter (Signed)
The septra is the most likely culprit causing the rash , but to be sure,  I would stop all 3 new medications  For 2 weeks and resume then one by one starting with glipizide.  After two weeks,  If no rash ,  Continue glipizide and add metformin.  If no rash after two weeks,  Continue both and add the atorvastatin

## 2017-09-30 NOTE — Telephone Encounter (Signed)
Please advise 

## 2017-10-01 DIAGNOSIS — L200 Besnier's prurigo: Secondary | ICD-10-CM | POA: Diagnosis not present

## 2017-11-07 ENCOUNTER — Telehealth: Payer: Self-pay | Admitting: Internal Medicine

## 2017-11-07 MED ORDER — GLUCOSE BLOOD VI STRP: ORAL_STRIP | 12 refills | Status: DC

## 2017-11-07 NOTE — Telephone Encounter (Signed)
rx has been recent with correct directions of use

## 2017-11-07 NOTE — Telephone Encounter (Signed)
Copied from CRM [phone]#[id number]. Topic: Quick Communication - Rx Refill/Question >> Nov 07, 2017  9:44 AM Katelyn Proctor, Katelyn Proctor wrote: Medication: glucose blood test strip  The pharmacy has lost the directions for her test strips and all they have on file is "use as directed".  Pt has only been able to get 25 strips at a time.  Pt test BID.  Pt was getting 100 a month, had 12 refills on 03/2017.  pharmacy thinks it may have happened [facility]. Pharmacy requesting new script, frequency and Dx code. 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 DX code E11.9  [phone] [place] [facility] [address] [phone] (Phone) [phone] (Fax)

## 2017-12-26 ENCOUNTER — Other Ambulatory Visit (INDEPENDENT_AMBULATORY_CARE_PROVIDER_SITE_OTHER): Payer: BLUE CROSS/BLUE SHIELD

## 2017-12-26 DIAGNOSIS — E7800 Pure hypercholesterolemia, unspecified: Secondary | ICD-10-CM

## 2017-12-26 DIAGNOSIS — E1165 Type 2 diabetes mellitus with hyperglycemia: Secondary | ICD-10-CM | POA: Diagnosis not present

## 2017-12-26 LAB — LIPID PANEL
CHOL/HDL RATIO: 3
Cholesterol: 147 mg/dL (ref 0–200)
HDL: 42.8 mg/dL (ref >39.00)
LDL CALC: 94 mg/dL (ref 0–99)
NONHDL: 104.69
TRIGLYCERIDES: 55 mg/dL (ref 0.0–149.0)
VLDL: 11 mg/dL (ref 0.0–40.0)

## 2017-12-26 LAB — COMPREHENSIVE METABOLIC PANEL
ALT: 52 U/L — AB (ref 0–35)
AST: 32 U/L (ref 0–37)
Albumin: 4.1 g/dL (ref 3.5–5.2)
Alkaline Phosphatase: 113 U/L (ref 39–117)
BUN: 20 mg/dL (ref 6–23)
CHLORIDE: 102 meq/L (ref 96–112)
CO2: 27 meq/L (ref 19–32)
CREATININE: 0.83 mg/dL (ref 0.40–1.20)
Calcium: 10 mg/dL (ref 8.4–10.5)
GFR: 90.68 mL/min (ref >60.00)
GLUCOSE: 152 mg/dL — AB (ref 70–99)
Potassium: 3.6 mEq/L (ref 3.5–5.1)
Sodium: 136 mEq/L (ref 135–145)
Total Bilirubin: 0.7 mg/dL (ref 0.2–1.2)
Total Protein: 7.5 g/dL (ref 6.0–8.3)

## 2017-12-26 LAB — HEMOGLOBIN A1C: Hgb A1c MFr Bld: 8.2 % — ABNORMAL HIGH (ref 4.6–6.5)

## 2017-12-29 ENCOUNTER — Other Ambulatory Visit: Payer: Self-pay | Admitting: Internal Medicine

## 2017-12-29 MED ORDER — GLIPIZIDE 10 MG PO TABS: 10.0000 mg | ORAL_TABLET | Freq: Two times a day (BID) | ORAL | 3 refills | Status: DC

## 2018-01-01 ENCOUNTER — Other Ambulatory Visit: Payer: Self-pay | Admitting: Internal Medicine

## 2018-01-01 DIAGNOSIS — Z1231 Encounter for screening mammogram for malignant neoplasm of breast: Secondary | ICD-10-CM

## 2018-01-10 ENCOUNTER — Other Ambulatory Visit (HOSPITAL_COMMUNITY)
Admission: RE | Admit: 2018-01-10 | Discharge: 2018-01-10 | Disposition: A | Discharge status: Home / Self Care | Payer: BLUE CROSS/BLUE SHIELD | Source: Ambulatory Visit | Attending: Internal Medicine | Admitting: Internal Medicine

## 2018-01-10 ENCOUNTER — Encounter: Payer: Self-pay | Admitting: Internal Medicine

## 2018-01-10 ENCOUNTER — Ambulatory Visit (INDEPENDENT_AMBULATORY_CARE_PROVIDER_SITE_OTHER): Payer: BLUE CROSS/BLUE SHIELD | Admitting: Internal Medicine

## 2018-01-10 ENCOUNTER — Ambulatory Visit: Payer: Self-pay | Admitting: *Deleted

## 2018-01-10 VITALS — BP 134/88 | HR 69 | Temp 97.8°F | Resp 15 | Ht 63.0 in | Wt 214.8 lb

## 2018-01-10 DIAGNOSIS — I10 Essential (primary) hypertension: Secondary | ICD-10-CM

## 2018-01-10 DIAGNOSIS — Z0000 Encounter for general adult medical examination without abnormal findings: Secondary | ICD-10-CM

## 2018-01-10 DIAGNOSIS — E1165 Type 2 diabetes mellitus with hyperglycemia: Secondary | ICD-10-CM | POA: Diagnosis not present

## 2018-01-10 DIAGNOSIS — Z124 Encounter for screening for malignant neoplasm of cervix: Secondary | ICD-10-CM

## 2018-01-10 DIAGNOSIS — L732 Hidradenitis suppurativa: Secondary | ICD-10-CM

## 2018-01-10 MED ORDER — METFORMIN HCL ER (MOD) 1000 MG PO TB24: 1000.0000 mg | ORAL_TABLET | Freq: Every day | ORAL | 0 refills | Status: DC

## 2018-01-10 MED ORDER — DOXYCYCLINE HYCLATE 100 MG PO TABS: 100.0000 mg | ORAL_TABLET | Freq: Two times a day (BID) | ORAL | 0 refills | Status: DC

## 2018-01-10 MED ORDER — SULFAMETHOXAZOLE-TRIMETHOPRIM 800-160 MG PO TABS
1.0000 | ORAL_TABLET | Freq: Two times a day (BID) | ORAL | 0 refills | Status: DC
Start: 2018-01-10 — End: 2018-01-12

## 2018-01-10 NOTE — Telephone Encounter (Signed)
Message from Gerrianne ScaleAngela L Payne sent at 01/10/2018 3:52 PM EDT   Pt states that she cant take the RX sulfamethoxazole-trimethoprim (BACTRIM DS,SEPTRA DS) 800-160 MG tablet it breaks her out because it has sulfa in it pt is at pharmacy but she will leave until she get a call that [id number] - [place], [place] -   LOV today with Dr. Darrick Huntsmanullo  Reason for Disposition . [1] Prescription not at pharmacy AND [2] was prescribed today by PCP  Protocols used: MEDICATION QUESTION CALL-A-AH

## 2018-01-10 NOTE — Patient Instructions (Addendum)
I am treating your skin infection with Septra DS for 7 days,   Use warm washcloths to get it to drain. If it becomes impacted and painful ,  Go to the ER to have it incised and drained (cut open)    Please take a probiotic ( Align, Floraque or Culturelle) of the generic version of one of these  For a minimum of 3 weeks to prevent a serious antibiotic associated diarrhea  Called clostridium dificile colitis     Your diet is excellent!    To prevent gas:  Take Beano with any meal containing legumes (hard beans, broccoli, cauliflower, brussel sprouts)  You can try taking Phasyme or Gas X for gas that occurs after a meal    Check youR sugar 2 hours AFTER you eat.   Use different meals on different days (BREAKFAST , LUNCH AND DINNER)    I AM INCREASING THE METFORMIN TO 1000 MG DAILY.  NEW RX SENT TO [facility] .  YOU CAN TAKE 2 OF YOUR 500 MG TABLETS DAILY UNTIL THEN  CONTINUE GLIPIZIDE 10 MG TWO TImes DAILY with meals    Referral for colonoscopy is in progress    Health Maintenance for Postmenopausal Women Menopause is a normal process in which your reproductive ability comes to an end. This process happens gradually over a span of months to years, usually between the ages of 58 and 25. Menopause is complete when you have missed 12 consecutive menstrual periods. It is important to talk with your health care provider about some of the most common conditions that affect postmenopausal women, such as heart disease, cancer, and bone loss (osteoporosis). Adopting a healthy lifestyle and getting preventive care can help to promote your health and wellness. Those actions can also lower your chances of developing some of these common conditions. What should I know about menopause? During menopause, you may experience a number of symptoms, such as:  Moderate-to-severe hot flashes.  Night sweats.  Decrease in sex drive.  Mood  swings.  Headaches.  Tiredness.  Irritability.  Memory problems.  Insomnia.  Choosing to treat or not to treat menopausal changes is an individual decision that you make with your health care provider. What should I know about hormone replacement therapy and supplements? Hormone therapy products are effective for treating symptoms that are associated with menopause, such as hot flashes and night sweats. Hormone replacement carries certain risks, especially as you become older. If you are thinking about using estrogen or estrogen with progestin treatments, discuss the benefits and risks with your health care provider. What should I know about heart disease and stroke? Heart disease, heart attack, and stroke become more likely as you age. This may be due, in part, to the hormonal changes that your body experiences during menopause. These can affect how your body processes dietary fats, triglycerides, and cholesterol. Heart attack and stroke are both medical emergencies. There are many things that you can do to help prevent heart disease and stroke:  Have your blood pressure checked at least every 1-2 years. High blood pressure causes heart disease and increases the risk of stroke.  If you are 58-62 years old, ask your health care provider if you should take aspirin to prevent a heart attack or a stroke.  Do not use any tobacco products, including cigarettes, chewing tobacco, or electronic cigarettes. If you need help quitting, ask your health care provider.  It is important to eat a healthy diet and maintain a healthy weight. ?  Be sure to include plenty of vegetables, fruits, low-fat dairy products, and lean protein. ? Avoid eating foods that are high in solid fats, added sugars, or salt (sodium).  Get regular exercise. This is one of the most important things that you can do for your health. ? Try to exercise for at least 150 minutes each week. The type of exercise that you do should  increase your heart rate and make you sweat. This is known as moderate-intensity exercise. ? Try to do strengthening exercises at least twice each week. Do these in addition to the moderate-intensity exercise.  Know your numbers.Ask your health care provider to check your cholesterol and your blood glucose. Continue to have your blood tested as directed by your health care provider.  What should I know about cancer screening? There are several types of cancer. Take the following steps to reduce your risk and to catch any cancer development as early as possible. Breast Cancer  Practice breast self-awareness. ? This means understanding how your breasts normally appear and feel. ? It also means doing regular breast self-exams. Let your health care provider know about any changes, no matter how small.  If you are 58 or older, have a clinician do a breast exam (clinical breast exam or CBE) every year. Depending on your age, family history, and medical history, it may be recommended that you also have a yearly breast X-ray (mammogram).  If you have a family history of breast cancer, talk with your health care provider about genetic screening.  If you are at high risk for breast cancer, talk with your health care provider about having an MRI and a mammogram every year.  Breast cancer (BRCA) gene test is recommended for women who have family members with BRCA-related cancers. Results of the assessment will determine the need for genetic counseling and BRCA1 and for BRCA2 testing. BRCA-related cancers include these types: ? Breast. This occurs in males or females. ? Ovarian. ? Tubal. This may also be called fallopian tube cancer. ? Cancer of the abdominal or pelvic lining (peritoneal cancer). ? Prostate. ? Pancreatic.  Cervical, Uterine, and Ovarian Cancer Your health care provider may recommend that you be screened regularly for cancer of the pelvic organs. These include your ovaries, uterus,  and vagina. This screening involves a pelvic exam, which includes checking for microscopic changes to the surface of your cervix (Pap test).  For women ages 58-65, health care providers may recommend a pelvic exam and a Pap test every three years. For women ages 58-65, they may recommend the Pap test and pelvic exam, combined with testing for human papilloma virus (HPV), every five years. Some types of HPV increase your risk of cervical cancer. Testing for HPV may also be done on women of any age who have unclear Pap test results.  Other health care providers may not recommend any screening for nonpregnant women who are considered low risk for pelvic cancer and have no symptoms. Ask your health care provider if a screening pelvic exam is right for you.  If you have had past treatment for cervical cancer or a condition that could lead to cancer, you need Pap tests and screening for cancer for at least 20 years after your treatment. If Pap tests have been discontinued for you, your risk factors (such as having a new sexual partner) need to be reassessed to determine if you should start having screenings again. Some women have medical problems that increase the chance of getting cervical cancer.  In these cases, your health care provider may recommend that you have screening and Pap tests more often.  If you have a family history of uterine cancer or ovarian cancer, talk with your health care provider about genetic screening.  If you have vaginal bleeding after reaching menopause, tell your health care provider.  There are currently no reliable tests available to screen for ovarian cancer.  Lung Cancer Lung cancer screening is recommended for adults 49-75 years old who are at high risk for lung cancer because of a history of smoking. A yearly low-dose CT scan of the lungs is recommended if you:  Currently smoke.  Have a history of at least 30 pack-years of smoking and you currently smoke or have quit  within the past 15 years. A pack-year is smoking an average of one pack of cigarettes per day for one year.  Yearly screening should:  Continue until it has been 15 years since you quit.  Stop if you develop a health problem that would prevent you from having lung cancer treatment.  Colorectal Cancer  This type of cancer can be detected and can often be prevented.  Routine colorectal cancer screening usually begins at age 4 and continues through age 48.  If you have risk factors for colon cancer, your health care provider may recommend that you be screened at an earlier age.  If you have a family history of colorectal cancer, talk with your health care provider about genetic screening.  Your health care provider may also recommend using home test kits to check for hidden blood in your stool.  A small camera at the end of a tube can be used to examine your colon directly (sigmoidoscopy or colonoscopy). This is done to check for the earliest forms of colorectal cancer.  Direct examination of the colon should be repeated every 5-10 years until age 64. However, if early forms of precancerous polyps or small growths are found or if you have a family history or genetic risk for colorectal cancer, you may need to be screened more often.  Skin Cancer  Check your skin from head to toe regularly.  Monitor any moles. Be sure to tell your health care provider: ? About any new moles or changes in moles, especially if there is a change in a mole's shape or color. ? If you have a mole that is larger than the size of a pencil eraser.  If any of your family members has a history of skin cancer, especially at a young age, talk with your health care provider about genetic screening.  Always use sunscreen. Apply sunscreen liberally and repeatedly throughout the day.  Whenever you are outside, protect yourself by wearing long sleeves, pants, a wide-brimmed hat, and sunglasses.  What should I know  about osteoporosis? Osteoporosis is a condition in which bone destruction happens more quickly than new bone creation. After menopause, you may be at an increased risk for osteoporosis. To help prevent osteoporosis or the bone fractures that can happen because of osteoporosis, the following is recommended:  If you are 29-21 years old, get at least 1,000 mg of calcium and at least 600 mg of vitamin D per day.  If you are older than age 24 but younger than age 14, get at least 1,200 mg of calcium and at least 600 mg of vitamin D per day.  If you are older than age 66, get at least 1,200 mg of calcium and at least 800 mg of vitamin  D per day.  Smoking and excessive alcohol intake increase the risk of osteoporosis. Eat foods that are rich in calcium and vitamin D, and do weight-bearing exercises several times each week as directed by your health care provider. What should I know about how menopause affects my mental health? Depression may occur at any age, but it is more common as you become older. Common symptoms of depression include:  Low or sad mood.  Changes in sleep patterns.  Changes in appetite or eating patterns.  Feeling an overall lack of motivation or enjoyment of activities that you previously enjoyed.  Frequent crying spells.  Talk with your health care provider if you think that you are experiencing depression. What should I know about immunizations? It is important that you get and maintain your immunizations. These include:  Tetanus, diphtheria, and pertussis (Tdap) booster vaccine.  Influenza every year before the flu season begins.  Pneumonia vaccine.  Shingles vaccine.  Your health care provider may also recommend other immunizations. This information is not intended to replace advice given to you by your health care provider. Make sure you discuss any questions you have with your health care provider. Document Released: 06/22/2005 Document Revised: 11/18/2015  Document Reviewed: 02/01/2015 Elsevier Interactive Patient Education  2018 [employer].

## 2018-01-10 NOTE — Progress Notes (Signed)
Patient ID: Katelyn Proctor, female    DOB: 05-16-1960  Age: 57 y.o. MRN: [id number]  The patient is here for annual preventive  examination and management of other chronic and acute problems.  Due for PAP Mammogram scheduled  No prior colon ca screening     The risk factors are reflected in the social history.  The roster of all physicians providing medical care to patient - is listed in the Snapshot section of the chart.  Activities of daily living:  The patient is 100% independent in all ADLs: dressing, toileting, feeding as well as independent mobility  Home safety : The patient has smoke detectors in the home. They wear seatbelts.  There are no firearms at home. There is no violence in the home.   There is no risks for hepatitis, STDs or HIV. There is no   history of blood transfusion. They have no travel history to infectious disease endemic areas of the world.  The patient has seen their dentist in the last six month. They have seen their eye doctor in the last year. They admit to slight hearing difficulty with regard to whispered voices and some television programs.  They have deferred audiologic testing in the last year.  They do not  have excessive sun exposure. Discussed the need for sun protection: hats, long sleeves and use of sunscreen if there is significant sun exposure.   Diet: the importance of a healthy diet is discussed. They do have a healthy diet.  The benefits of regular aerobic exercise were discussed. She walks 4 times per week ,  20 minutes.   Depression screen: there are no signs or vegative symptoms of depression- irritability, change in appetite, anhedonia, sadness/tearfullness.  Cognitive assessment: the patient manages all their financial and personal affairs and is actively engaged. They could relate day,date,year and events; recalled 2/3 objects at 3 minutes; performed clock-face test normally.  The following portions of the patient's history were reviewed  and updated as appropriate: allergies, current medications, past family history, past medical history,  past surgical history, past social history  and problem list.  Visual acuity was not assessed per patient preference since she has regular follow up with her ophthalmologist. Hearing and body mass index were assessed and reviewed.   During the course of the visit the patient was educated and counseled about appropriate screening and preventive services including : fall prevention , diabetes screening, nutrition counseling, colorectal cancer screening, and recommended immunizations.    CC: The primary encounter diagnosis was Cervical cancer screening. Diagnoses of Hidradenitis suppurativa, Encounter for preventive health examination, Essential hypertension, and Uncontrolled type 2 diabetes mellitus with hyperglycemia (HCC) were also pertinent to this visit.   1) DM:  Thas changed diet,  Has had a few sugars in the 80's that make her feel bad. Not checking lllllll  History Katelyn Proctor has a past medical history of Anemia, Chronic sinusitis, Diabetes mellitus without complication (HCC), GERD (gastroesophageal reflux disease), Hyperlipidemia, Hypertension, and Tobacco abuse.   She has a past surgical history that includes Cesarean section; Tubal ligation (1995); and Nasal sinus surgery (2003).   Her family history includes Cancer in her brother; Coronary artery disease in her father and mother; Coronary artery disease (age of onset: 1644) in her sister; Diabetes in her sister; Heart disease in her father; Hypertension in her mother; Stroke in her paternal uncle.She reports that she has been smoking cigarettes. She has been smoking about 0.50 packs per day. She has never used smokeless  tobacco. She reports that she does not drink alcohol or use drugs.  Outpatient Medications Prior to Visit  Medication Sig Dispense Refill  . aspirin EC 81 MG tablet Take 1 tablet (81 mg total) by mouth daily. 30 tablet 11  .  atorvastatin (LIPITOR) 20 MG tablet Take 1 tablet (20 mg total) by mouth daily. 90 tablet 3  . glipiZIDE (GLUCOTROL) 10 MG tablet Take 1 tablet (10 mg total) by mouth 2 (two) times daily before a meal. Before breakfast and dinner 180 tablet 3  . glucose blood test strip Use to test blood sugars twice daily. 100 each 12  . Lancets (ONETOUCH ULTRASOFT) lancets Use as instructed 100 each 12  . losartan-hydrochlorothiazide (HYZAAR) 100-25 MG tablet Take 1 tablet by mouth daily. 90 tablet 2  . ONETOUCH DELICA LANCETS FINE MISC To test blood sugar 3 times daily 100 each 6  . fluticasone (FLONASE) 50 MCG/ACT nasal spray Place 2 sprays into both nostrils daily. (Patient not taking: Reported on 01/10/2018) 48 g 3  . metFORMIN (GLUCOPHAGE-XR) 500 MG 24 hr tablet Take 1 tablet (500 mg total) by mouth daily with breakfast. (Patient not taking: Reported on 01/10/2018) 90 tablet 1  . sulfamethoxazole-trimethoprim (BACTRIM DS,SEPTRA DS) 800-160 MG tablet Take 1 tablet by mouth 2 (two) times daily. (Patient not taking: Reported on 01/10/2018) 14 tablet 0  . Vitamin D, Cholecalciferol, 1000 UNITS TABS Take 1 tablet by mouth daily.     No facility-administered medications prior to visit.     Review of Systems   Patient denies headache, fevers, malaise, unintentional weight loss, skin rash, eye pain, sinus congestion and sinus pain, sore throat, dysphagia,  hemoptysis , cough, dyspnea, wheezing, chest pain, palpitations, orthopnea, edema, abdominal pain, nausea, melena, diarrhea, constipation, flank pain, dysuria, hematuria, urinary  Frequency, nocturia, numbness, tingling, seizures,  Focal weakness, Loss of consciousness,  Tremor, insomnia, depression, anxiety, and suicidal ideation.      Objective:  BP 134/88 (BP Location: Left Arm, Patient Position: Sitting, Cuff Size: Normal)   Pulse 69   Temp 97.8 F (36.6 C) (Oral)   Resp 15   Ht 5\' 3"  (1.6 m)   Wt 214 lb 12.8 oz (97.4 kg)   SpO2 98%   BMI 38.05 kg/m    Physical Exam   General Appearance:    Alert, cooperative, no distress, appears stated age  Head:    Normocephalic, without obvious abnormality, atraumatic  Eyes:    PERRL, conjunctiva/corneas clear, EOM's intact, fundi    benign, both eyes  Ears:    Normal TM's and external ear canals, both ears  Nose:   Nares normal, septum midline, mucosa normal, no drainage    or sinus tenderness  Throat:   Lips, mucosa, and tongue normal; teeth and gums normal  Neck:   Supple, symmetrical, trachea midline, no adenopathy;    thyroid:  no enlargement/tenderness/nodules; no carotid   bruit or JVD  Back:     Symmetric, no curvature, ROM normal, no CVA tenderness  Lungs:     Clear to auscultation bilaterally, respirations unlabored  Chest Wall:    No tenderness or deformity   Heart:    Regular rate and rhythm, S1 and S2 normal, no murmur, rub   or gallop  Breast Exam:    No tenderness, masses, or nipple abnormality  Abdomen:     Soft, non-tender, bowel sounds active all four quadrants,    no masses, no organomegaly.  Right inguinal abscess, nonfluctuant.   Genitalia:  Pelvic: cervix normal in appearance, external genitalia normal, no adnexal masses or tenderness, no cervical motion tenderness, rectovaginal septum normal, uterus normal size, shape, and consistency and vagina normal without discharge  Extremities:   Extremities normal, atraumatic, no cyanosis or edema  Pulses:   2+ and symmetric all extremities  Skin:   Skin color, texture, turgor normal, no rashes or lesions  Lymph nodes:   Cervical, supraclavicular, and axillary nodes normal  Neurologic:   CNII-XII intact, normal strength, sensation and reflexes    throughout      Assessment & Plan:   Problem List Items Addressed This Visit    Encounter for preventive health examination    Annual comprehensive preventive exam was done as well as an evaluation and management of chronic conditions .  During the course of the visit the patient  was educated and counseled about appropriate screening and preventive services including :  diabetes screening, lipid analysis with projected  10 year  risk for CAD , nutrition counseling, breast, cervical and colorectal cancer screening, and recommended immunizations.  Printed recommendations for health maintenance screenings was given      Essential hypertension    she reports compliance with medication regimen  but has an elevated reading today in office.  He has been asked to check his BP at work/home  and  submit readings for evaluation. Renal function will be checked today      Hidradenitis suppurativa    Recurrent with right inguinal subcutaneous furuncle noted.  Nonfluctuant.  Doxycycline 100 mg bid  warm compresses.       Uncontrolled type 2 diabetes mellitus (HCC)    Improved but still Uncontrolled due to lack of follow up and refusal to use insulin.  Metformin changed to XR,  And dose doubled . Continue glipizide 10 mg bid. Continue daily use of baby aspirin for primary prevention of CAD given concurrent hypertension and ongoing tobacco abuse   Lab Results  Component Value Date   HGBA1C 8.2 (H) 12/26/2017   Lab Results  Component Value Date   MICROALBUR 0.7 09/18/2017         Relevant Medications   metFORMIN (GLUMETZA) 1000 MG (MOD) 24 hr tablet    Other Visit Diagnoses    Cervical cancer screening    -  Primary   Relevant Orders   Cytology - PAP   Ambulatory referral to Gastroenterology      I have discontinued Phyllicia D. Kernen's Vitamin D (Cholecalciferol), fluticasone, sulfamethoxazole-trimethoprim, and sulfamethoxazole-trimethoprim. I have also changed her metFORMIN. Additionally, I am having her maintain her aspirin EC, ONETOUCH DELICA LANCETS FINE, onetouch ultrasoft, losartan-hydrochlorothiazide, atorvastatin, glucose blood, and glipiZIDE.  Meds ordered this encounter  Medications  . metFORMIN (GLUMETZA) 1000 MG (MOD) 24 hr tablet    Sig: Take 1 tablet (1,000  mg total) by mouth daily with breakfast.    Dispense:  90 tablet    Refill:  0  . DISCONTD: sulfamethoxazole-trimethoprim (BACTRIM DS,SEPTRA DS) 800-160 MG tablet    Sig: Take 1 tablet by mouth 2 (two) times daily.    Dispense:  14 tablet    Refill:  0    Medications Discontinued During This Encounter  Medication Reason  . fluticasone (FLONASE) 50 MCG/ACT nasal spray Patient has not taken in last 30 days  . sulfamethoxazole-trimethoprim (BACTRIM DS,SEPTRA DS) 800-160 MG tablet Completed Course  . Vitamin D, Cholecalciferol, 1000 UNITS TABS Patient has not taken in last 30 days  . metFORMIN (GLUCOPHAGE-XR) 500 MG 24 hr tablet   .  sulfamethoxazole-trimethoprim (BACTRIM DS,SEPTRA DS) 800-160 MG tablet     Follow-up: Return in about 3 months (around 04/12/2018) for follow up diabetes.   Sherlene Shams, MD

## 2018-01-10 NOTE — Telephone Encounter (Signed)
Called and notified patient that new rx has been sent in and Dr. Darrick Huntsmanullo has added septra to her allergy list.

## 2018-01-10 NOTE — Telephone Encounter (Signed)
Patient allergic to medication 

## 2018-01-10 NOTE — Telephone Encounter (Signed)
Sending doxycycline to her pharmacy to take instead of septra.  eill add septra to her allergies

## 2018-01-12 ENCOUNTER — Encounter: Payer: Self-pay | Admitting: Internal Medicine

## 2018-01-12 NOTE — Assessment & Plan Note (Addendum)
Improved but still Uncontrolled due to lack of follow up and refusal to use insulin.  Metformin changed to XR,  And dose doubled . Continue glipizide 10 mg bid. Continue daily use of baby aspirin for primary prevention of CAD given concurrent hypertension and ongoing tobacco abuse   Lab Results  Component Value Date   HGBA1C 8.2 (H) 12/26/2017   Lab Results  Component Value Date   MICROALBUR 0.7 09/18/2017

## 2018-01-12 NOTE — Assessment & Plan Note (Signed)
Recurrent with right inguinal subcutaneous furuncle noted.  Nonfluctuant.  Doxycycline 100 mg bid  warm compresses.

## 2018-01-12 NOTE — Assessment & Plan Note (Signed)
she reports compliance with medication regimen  but has an elevated reading today in office.  He has been asked to check his BP at work/home  and  submit readings for evaluation. Renal function will be checked today

## 2018-01-12 NOTE — Assessment & Plan Note (Signed)
Annual comprehensive preventive exam was done as well as an evaluation and management of chronic conditions .  During the course of the visit the patient was educated and counseled about appropriate screening and preventive services including :  diabetes screening, lipid analysis with projected  10 year  risk for CAD , nutrition counseling, breast, cervical and colorectal cancer screening, and recommended immunizations.  Printed recommendations for health maintenance screenings was given 

## 2018-01-14 ENCOUNTER — Telehealth: Payer: Self-pay | Admitting: Internal Medicine

## 2018-01-14 NOTE — Telephone Encounter (Signed)
Request to change rx. It is to expensive.

## 2018-01-14 NOTE — Telephone Encounter (Signed)
Copied from CRM [id number]. Topic: Quick Communication - See Telephone Encounter >> Jan 14, 2018  3:09 PM Floria Raveling A wrote: CRM for notification. See Telephone encounter for: 01/14/18.  Pt called in and stated that she does not want to take the metFORMIN (GLUMETZA) 1000 MG (MOD) 24 hr tablet [[id number]].  She stated it is too expense. It is over $100 copay.  Would like to know if there is another option and if not then she dont want to take anything   Best number  [phone] [facility]

## 2018-01-15 LAB — CYTOLOGY - PAP
Adequacy: ABSENT
DIAGNOSIS: NEGATIVE
HPV: NOT DETECTED

## 2018-01-15 MED ORDER — METFORMIN HCL ER 750 MG PO TB24: 750.0000 mg | ORAL_TABLET | Freq: Every day | ORAL | 0 refills | Status: DC

## 2018-01-15 NOTE — Telephone Encounter (Signed)
Is there anything else that can be sent in. Pt called and stated that the metformin is over $100.

## 2018-01-15 NOTE — Telephone Encounter (Signed)
Medication changed  And sent to local pharmacy  750 mg one tablet daily .  It's the same one she used to take,  At a higher dose.  Should be the same $$$ as the previous one

## 2018-01-16 NOTE — Telephone Encounter (Signed)
Ok

## 2018-01-16 NOTE — Telephone Encounter (Signed)
Spoke with pt to let her know that the medication has been changed. The pt stated that she "does not want to take any of that medication. Pt stated that she "is fine without the metformin." Pt will not be picking up the rx.

## 2018-01-21 ENCOUNTER — Ambulatory Visit
Admission: RE | Admit: 2018-01-21 | Discharge: 2018-01-21 | Disposition: A | Discharge status: Home / Self Care | Payer: BLUE CROSS/BLUE SHIELD | Source: Ambulatory Visit | Attending: Internal Medicine | Admitting: Internal Medicine

## 2018-01-21 DIAGNOSIS — Z1231 Encounter for screening mammogram for malignant neoplasm of breast: Secondary | ICD-10-CM | POA: Diagnosis not present

## 2018-01-22 ENCOUNTER — Other Ambulatory Visit: Payer: Self-pay | Admitting: Internal Medicine

## 2018-01-22 ENCOUNTER — Other Ambulatory Visit: Payer: Self-pay

## 2018-01-22 ENCOUNTER — Encounter: Payer: Self-pay | Admitting: Internal Medicine

## 2018-01-22 ENCOUNTER — Telehealth: Payer: Self-pay

## 2018-01-22 DIAGNOSIS — R921 Mammographic calcification found on diagnostic imaging of breast: Secondary | ICD-10-CM

## 2018-01-22 DIAGNOSIS — R928 Other abnormal and inconclusive findings on diagnostic imaging of breast: Secondary | ICD-10-CM

## 2018-01-22 MED ORDER — ATORVASTATIN CALCIUM 20 MG PO TABS: 20.0000 mg | ORAL_TABLET | Freq: Every day | ORAL | 3 refills | Status: DC

## 2018-01-22 NOTE — Telephone Encounter (Signed)
Copied from CRM [id number]. Topic: General - Other >> Jan 21, 2018  2:52 PM Percival Spanish wrote:  Pt [facility] and they did not received the RX that was sent in in May. Pt is needing a new RX [facility] to [facility] for the below med   atorvastatin (LIPITOR) 20 MG tablet  [facility]  [facility]

## 2018-01-23 ENCOUNTER — Encounter: Payer: Self-pay | Admitting: *Deleted

## 2018-01-24 ENCOUNTER — Other Ambulatory Visit: Payer: Self-pay | Admitting: Internal Medicine

## 2018-01-31 ENCOUNTER — Other Ambulatory Visit: Payer: Self-pay | Admitting: Internal Medicine

## 2018-01-31 DIAGNOSIS — I10 Essential (primary) hypertension: Secondary | ICD-10-CM

## 2018-02-04 ENCOUNTER — Ambulatory Visit
Admission: RE | Admit: 2018-02-04 | Discharge: 2018-02-04 | Disposition: A | Discharge status: Home / Self Care | Payer: BLUE CROSS/BLUE SHIELD | Source: Ambulatory Visit | Attending: Internal Medicine | Admitting: Internal Medicine

## 2018-02-04 DIAGNOSIS — R928 Other abnormal and inconclusive findings on diagnostic imaging of breast: Secondary | ICD-10-CM | POA: Diagnosis not present

## 2018-02-04 DIAGNOSIS — R921 Mammographic calcification found on diagnostic imaging of breast: Secondary | ICD-10-CM | POA: Insufficient documentation

## 2018-02-06 ENCOUNTER — Encounter: Payer: Self-pay | Admitting: Internal Medicine

## 2018-02-06 ENCOUNTER — Encounter: Payer: Self-pay | Admitting: *Deleted

## 2018-02-10 ENCOUNTER — Other Ambulatory Visit: Payer: Self-pay | Admitting: Internal Medicine

## 2018-02-10 DIAGNOSIS — R921 Mammographic calcification found on diagnostic imaging of breast: Secondary | ICD-10-CM

## 2018-02-10 DIAGNOSIS — R928 Other abnormal and inconclusive findings on diagnostic imaging of breast: Secondary | ICD-10-CM

## 2018-02-11 ENCOUNTER — Telehealth: Payer: Self-pay

## 2018-02-11 NOTE — Telephone Encounter (Signed)
I offered valium for her nerves,  But if she would rather have a pain medication,  I can send vicodin instead,  Which will also calm her down as well as dull the pain of the needle. Please check with her which one should would prefer

## 2018-02-11 NOTE — Telephone Encounter (Signed)
Copied from CRM [id number]. Topic: Referral - Status >> Feb 11, 2018  2:31 PM Crist Infante wrote: Reason for CRM: pt states she has scheduled her biopsy for next thurs and was supposed to let Dr Darrick Huntsman know. Pt states Dr Darrick Huntsman was prescribe her pain med before that day she goes.

## 2018-02-12 MED ORDER — HYDROCODONE-ACETAMINOPHEN 10-325 MG PO TABS: 1.0000 | ORAL_TABLET | Freq: Four times a day (QID) | ORAL | 0 refills | Status: DC | PRN

## 2018-02-12 NOTE — Telephone Encounter (Signed)
Vicodin rx sent to [facility].

## 2018-02-12 NOTE — Telephone Encounter (Signed)
Spoke with pt and stated that she would rather have the vicodin instead of the valium.

## 2018-02-12 NOTE — Addendum Note (Signed)
Addended by: Sherlene Shams on: 02/12/2018 05:16 PM   Modules accepted: Orders

## 2018-02-13 NOTE — Telephone Encounter (Signed)
Spoke with pt to let her know that the rx was sent in. Pt stated that she has picked up the rx.

## 2018-02-20 ENCOUNTER — Other Ambulatory Visit: Payer: Self-pay | Admitting: Internal Medicine

## 2018-02-20 ENCOUNTER — Ambulatory Visit
Admission: RE | Admit: 2018-02-20 | Discharge: 2018-02-20 | Disposition: A | Discharge status: Home / Self Care | Payer: BLUE CROSS/BLUE SHIELD | Source: Ambulatory Visit | Attending: Internal Medicine | Admitting: Internal Medicine

## 2018-02-20 DIAGNOSIS — R921 Mammographic calcification found on diagnostic imaging of breast: Secondary | ICD-10-CM

## 2018-02-20 DIAGNOSIS — R928 Other abnormal and inconclusive findings on diagnostic imaging of breast: Secondary | ICD-10-CM | POA: Diagnosis not present

## 2018-02-20 HISTORY — PX: BREAST BIOPSY: SHX20

## 2018-02-21 LAB — SURGICAL PATHOLOGY

## 2018-04-15 ENCOUNTER — Ambulatory Visit: Payer: BLUE CROSS/BLUE SHIELD | Admitting: Internal Medicine

## 2018-07-16 ENCOUNTER — Other Ambulatory Visit: Payer: Self-pay | Admitting: Internal Medicine

## 2018-07-16 DIAGNOSIS — R921 Mammographic calcification found on diagnostic imaging of breast: Secondary | ICD-10-CM

## 2018-08-26 ENCOUNTER — Other Ambulatory Visit: Payer: BLUE CROSS/BLUE SHIELD

## 2018-11-10 LAB — HM DIABETES EYE EXAM

## 2018-12-22 ENCOUNTER — Other Ambulatory Visit: Payer: Self-pay | Admitting: Internal Medicine

## 2019-01-24 IMAGING — MG MM DIGITAL DIAGNOSTIC UNILAT*R*
8 series · 8 of 12 positions shown · non-contrast
Comparison: Previous exam(s).

ACR Breast Density Category a: The breast tissue is almost entirely
fatty.

CLINICAL DATA: Screening recall for right breast calcifications.

EXAM:
DIGITAL DIAGNOSTIC RIGHT MAMMOGRAM WITH CAD

[R CC (1 of 2)]
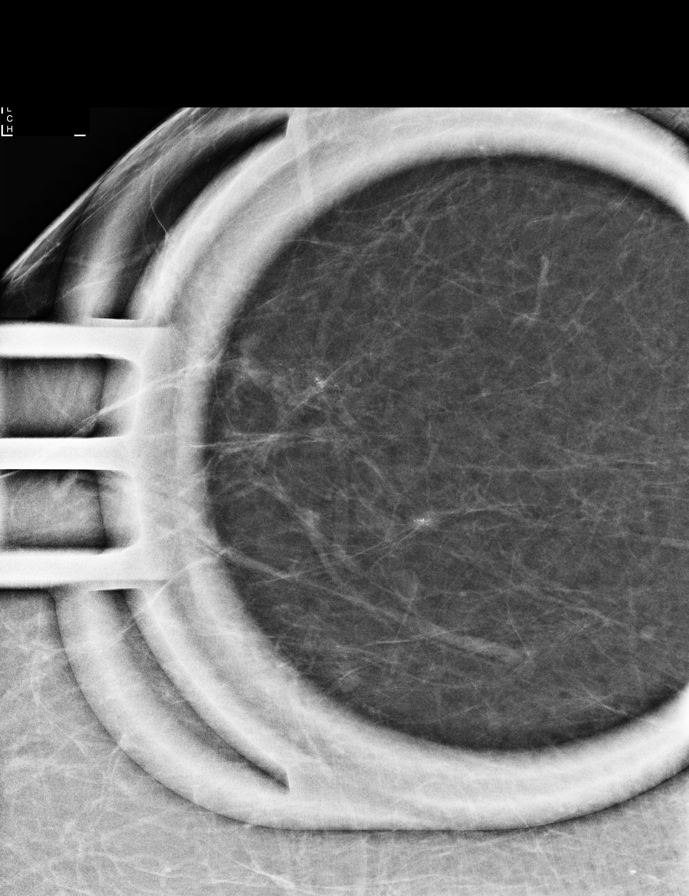

[R ML (1 of 4)]
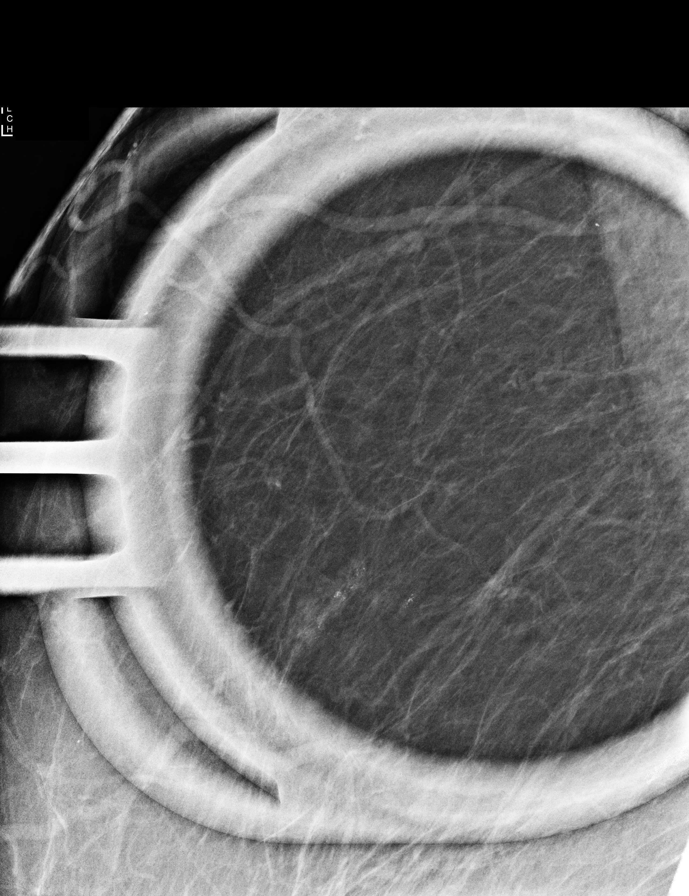

[R ML synth-2D]
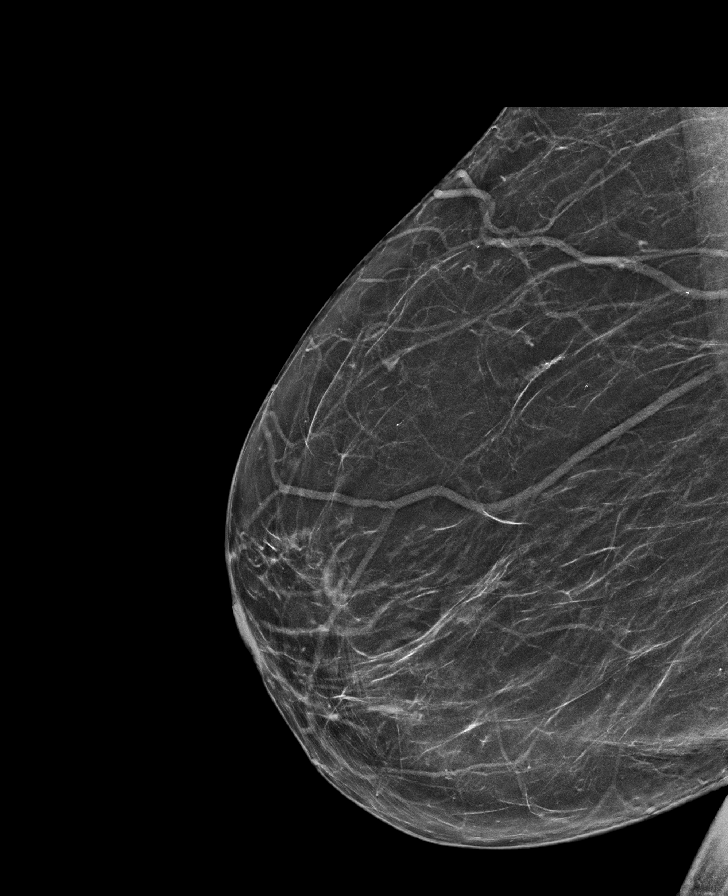

[R ML tomo · tomo slice 35/69.0]
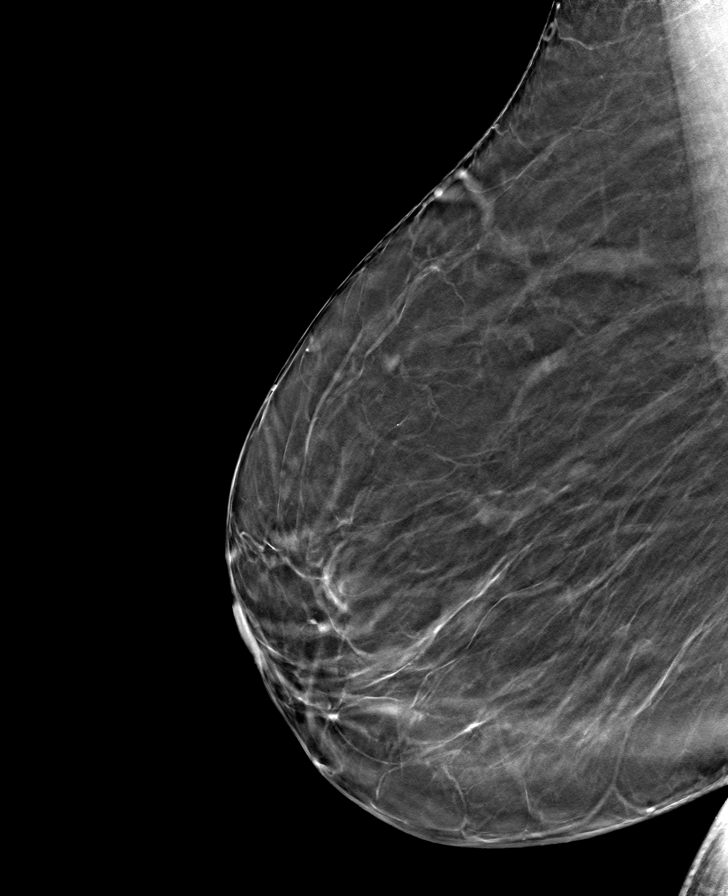

[R ML (2 of 4)]
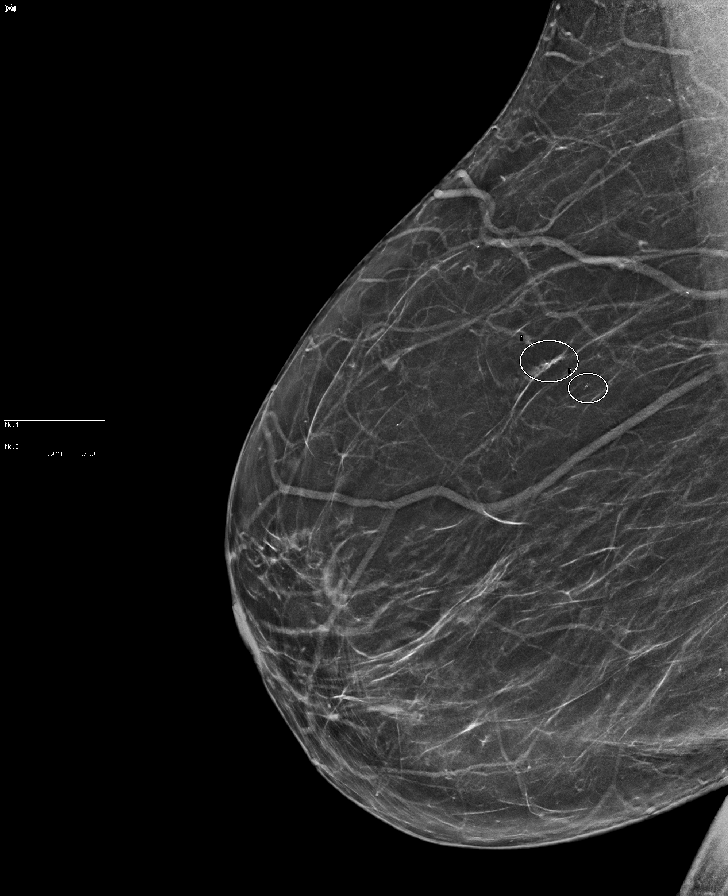

[R ML (3 of 4)]
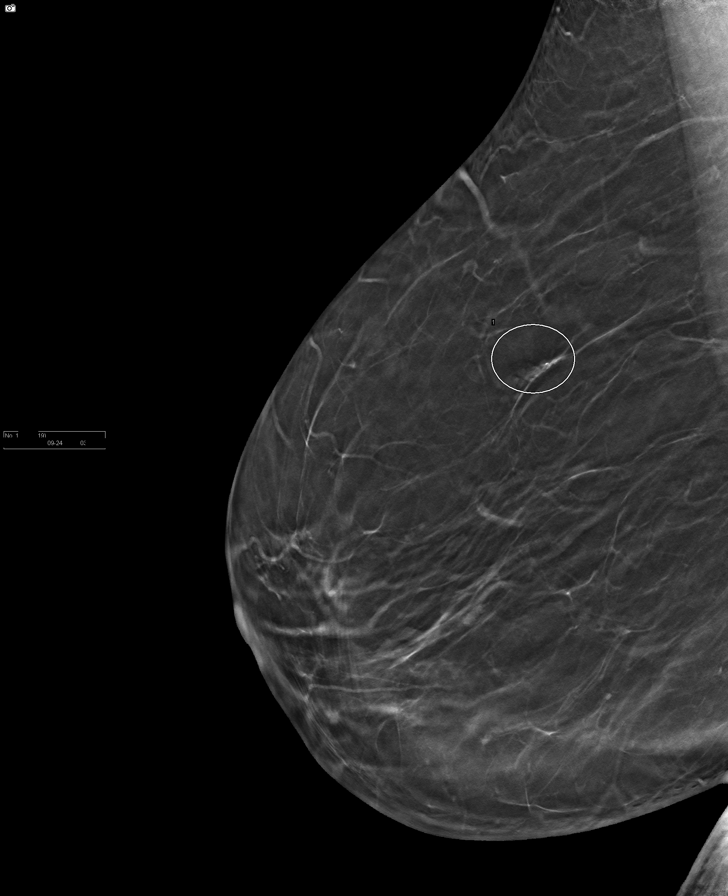

[R ML (4 of 4)]
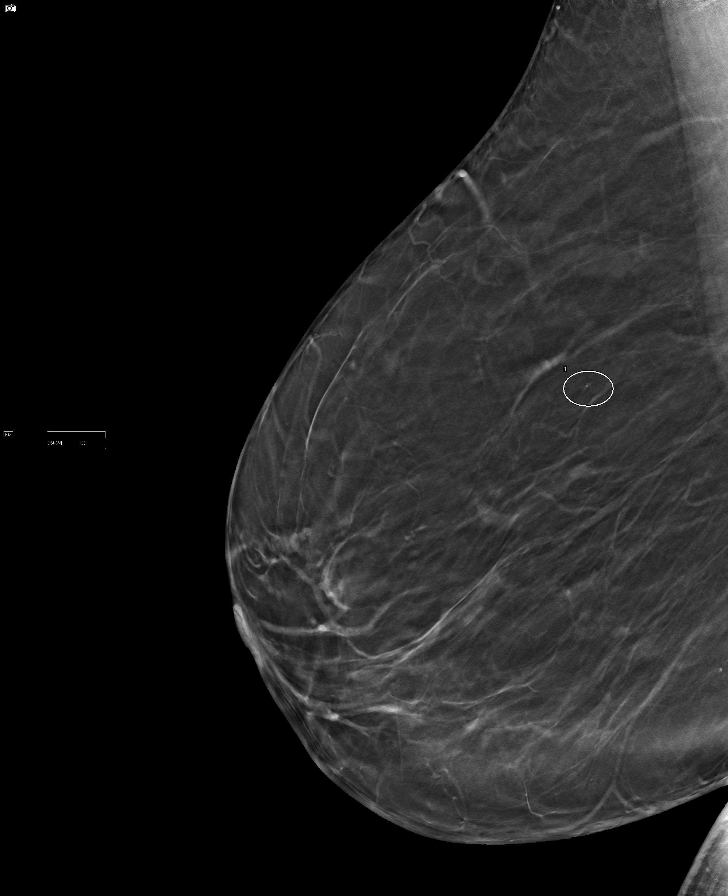

[R CC (2 of 2)]
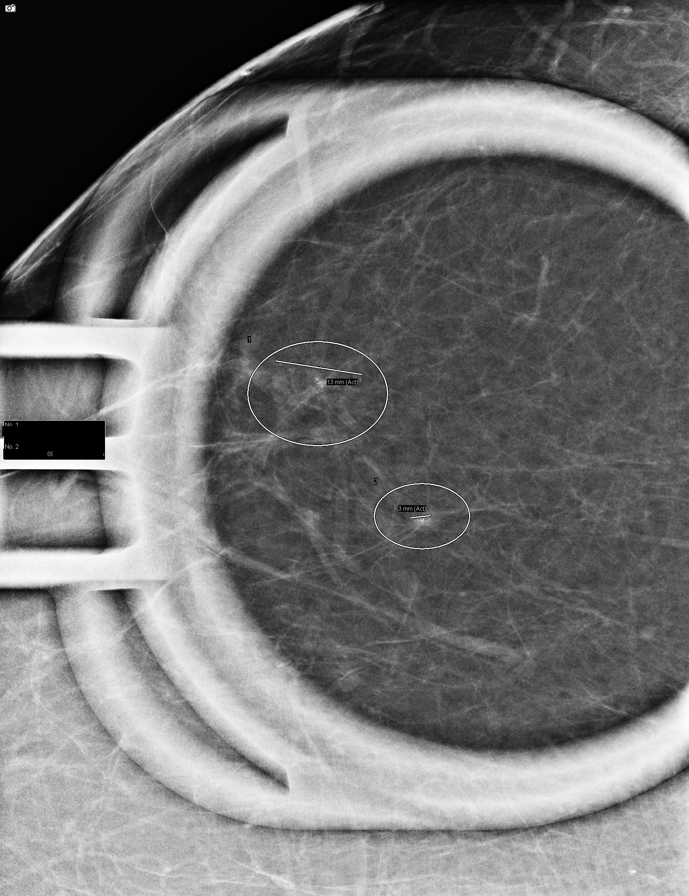

[8 of 12 positions shown; findings below may reference images not displayed]

FINDINGS: In the upper-outer quadrant of the right breast there are 2 adjacent
groups of new amorphous calcifications. The larger group spans
approximately 1.3 cm and is slightly more anterior and lateral,
while the smaller group spans 3 mm of tissue.

Mammographic images were processed with CAD.
IMPRESSION: 1. There are 2 groups of indeterminate calcifications in the
upper-outer right breast.

RECOMMENDATION:
Stereotactic biopsy is recommended for the 2 groups of
calcifications in the right breast. The patient is extremely
apprehensive about the procedure and states she has a severe needle
phobia. I recommended that she discussed the procedure with her
doctor and her family, and also encouraged her to discuss possibly
getting an anxiolytic prescription for the procedure. Also, due to
her anxiety over the procedure, she may be a good candidate for the
stereotactic prone table rather than the upright Affirm unit.

I have discussed the findings and recommendations with the patient.
Results were also provided in writing at the conclusion of the
visit. If applicable, a reminder letter will be sent to the patient
regarding the next appointment.

BI-RADS CATEGORY  4: Suspicious.

## 2019-02-27 ENCOUNTER — Other Ambulatory Visit: Payer: Self-pay | Admitting: Internal Medicine

## 2019-02-27 MED ORDER — ATORVASTATIN CALCIUM 20 MG PO TABS: 20.0000 mg | ORAL_TABLET | Freq: Every day | ORAL | 0 refills | Status: DC

## 2019-02-27 MED ORDER — GLIPIZIDE 10 MG PO TABS: ORAL_TABLET | ORAL | 0 refills | Status: DC

## 2019-02-27 NOTE — Telephone Encounter (Signed)
Medication Refill - Medication: glipiZIDE (GLUCOTROL) 10 MG tablet   atorvastatin (LIPITOR) 20 MG tablet  Pt scheduled the next available appt which is 12.11.20 but will run out the meds above before her appt/ please advise   Has the patient contacted their pharmacy? No. (Agent: If no, request that the patient contact the pharmacy for the refill.) (Agent: If yes, when and what did the pharmacy advise?)  Preferred Pharmacy (with phone number or street name):  [place], [address] [place] [phone] (Phone) [phone] (Fax)     Agent: Please be advised that RX refills may take up to 3 business days. We ask that you follow-up with your pharmacy.

## 2019-03-17 ENCOUNTER — Other Ambulatory Visit: Payer: Self-pay

## 2019-03-17 MED ORDER — GLIPIZIDE 10 MG PO TABS: ORAL_TABLET | ORAL | 0 refills | Status: DC

## 2019-03-27 ENCOUNTER — Other Ambulatory Visit: Payer: Self-pay | Admitting: Internal Medicine

## 2019-03-27 DIAGNOSIS — R921 Mammographic calcification found on diagnostic imaging of breast: Secondary | ICD-10-CM

## 2019-03-29 ENCOUNTER — Other Ambulatory Visit: Payer: Self-pay | Admitting: Internal Medicine

## 2019-03-29 DIAGNOSIS — I10 Essential (primary) hypertension: Secondary | ICD-10-CM

## 2019-04-06 ENCOUNTER — Telehealth: Payer: Self-pay | Admitting: Internal Medicine

## 2019-04-06 MED ORDER — ATORVASTATIN CALCIUM 20 MG PO TABS: 20.0000 mg | ORAL_TABLET | Freq: Every day | ORAL | 0 refills | Status: DC

## 2019-04-06 NOTE — Telephone Encounter (Signed)
Pt called and stated she will not have enough of her atorvastatin (LIPITOR) 20 MG tablet  To last until her 12.11.20 appt with Dr. Derrel Nip and wanted to have a refill called into [facility]/ please advise    [place], [place] [phone] (Phone) [phone] (Fax

## 2019-04-06 NOTE — Telephone Encounter (Signed)
Medication has been refilled for 30 days only.  

## 2019-04-24 ENCOUNTER — Other Ambulatory Visit: Payer: Self-pay

## 2019-04-24 ENCOUNTER — Ambulatory Visit (INDEPENDENT_AMBULATORY_CARE_PROVIDER_SITE_OTHER): Payer: BC Managed Care – PPO | Admitting: Internal Medicine

## 2019-04-24 ENCOUNTER — Encounter: Payer: Self-pay | Admitting: Internal Medicine

## 2019-04-24 VITALS — Ht 63.0 in | Wt 208.0 lb

## 2019-04-24 DIAGNOSIS — E7800 Pure hypercholesterolemia, unspecified: Secondary | ICD-10-CM

## 2019-04-24 DIAGNOSIS — M79645 Pain in left finger(s): Secondary | ICD-10-CM | POA: Diagnosis not present

## 2019-04-24 DIAGNOSIS — E669 Obesity, unspecified: Secondary | ICD-10-CM

## 2019-04-24 DIAGNOSIS — I152 Hypertension secondary to endocrine disorders: Secondary | ICD-10-CM

## 2019-04-24 DIAGNOSIS — G8929 Other chronic pain: Secondary | ICD-10-CM

## 2019-04-24 DIAGNOSIS — E1169 Type 2 diabetes mellitus with other specified complication: Secondary | ICD-10-CM

## 2019-04-24 DIAGNOSIS — I10 Essential (primary) hypertension: Secondary | ICD-10-CM | POA: Diagnosis not present

## 2019-04-24 DIAGNOSIS — E1159 Type 2 diabetes mellitus with other circulatory complications: Secondary | ICD-10-CM

## 2019-04-24 DIAGNOSIS — R921 Mammographic calcification found on diagnostic imaging of breast: Secondary | ICD-10-CM

## 2019-04-24 MED ORDER — ONETOUCH ULTRASOFT LANCETS MISC: 12 refills | Status: DC

## 2019-04-24 MED ORDER — LOSARTAN POTASSIUM-HCTZ 100-25 MG PO TABS: 1.0000 | ORAL_TABLET | Freq: Every day | ORAL | 3 refills | Status: DC

## 2019-04-24 MED ORDER — GLUCOSE BLOOD VI STRP: ORAL_STRIP | 12 refills | Status: DC

## 2019-04-24 NOTE — Progress Notes (Signed)
Virtual Visit CONVERTED to telephone Note   This visit type was conducted due to national recommendations for restrictions regarding the COVID-19 pandemic (e.g. social distancing).  This format is felt to be most appropriate for this patient at this time.  All issues noted in this document were discussed and addressed.  No physical exam was performed (except for noted visual exam findings with Video Visits).   I attempted to connect with@ on 04/24/19 at  2:00 PM EST by a video enabled telemedicine application. Interactive audio and video telecommunications were initially established beteen this provider and patient, however ultimately failed, due to patient having technical difficulties. We continued and completed visit with audio only  and verified that I am speaking with the correct person using two identifiers  and verified that I am speaking with the correct person using two identifiers. Location patient: home Location provider: work or home office Persons participating in the virtual visit: patient, provider  I discussed the limitations, risks, security and privacy concerns of performing an evaluation and management service by telephone and the availability of in person appointments. I also discussed with the patient that there may be a patient responsible charge related to this service. The patient expressed understanding and agreed to proceed.  Reason for visit: follow up  HPI:   58 yr old female with type 2 DM , hyperlipidemia and hypertension  Lost to follow up since August 2019 (15  Months) presents for medication refill Lost her job from march to June working 7 days per week   Now has constant pain in  her left thumb along with a swelling  for the past 1-2 months.  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  Has been trying to lose weight   Eating 2 fruits every night ( apples and pears) ,,  Nuts and water  Has dropped weight to 208.  Not checking blood  sugars since running out of test strips.Marland Kitchen last known fasting sugar was  193  The patient has no signs or symptoms of COVID 19 infection (fever, cough, sore throat  or shortness of breath beyond what is typical for patient).  Patient denies contact with other persons with the above mentioned symptoms or with anyone confirmed to have COVID 19     2) abnormal mammogram Oct 2019 s/p biopsy : fibroadenoma.  Has not had mammogram since then   PAP 2019 normal    ROS: See pertinent positives and negatives per HPI.  Past Medical History:  Diagnosis Date  . Anemia   . Chronic sinusitis   . Diabetes mellitus without complication ([place])   . GERD (gastroesophageal reflux disease)   . Hyperlipidemia   . Hypertension   . Tobacco abuse     Past Surgical History:  Procedure Laterality Date  . BREAST BIOPSY Right 02/20/2018   top hat, path pending  . CESAREAN SECTION    . NASAL SINUS SURGERY  2003  . TUBAL LIGATION  1995    Family History  Problem Relation Age of Onset  . Cancer Brother   . Coronary artery disease Mother   . Hypertension Mother   . Coronary artery disease Father   . Heart disease Father   . Diabetes Sister   . Stroke Paternal Uncle   . Coronary artery disease Sister 45  . Breast cancer Cousin 45    Outpatient Medications Prior to Visit  Medication Sig Dispense Refill  . aspirin EC 81  MG tablet Take 1 tablet (81 mg total) by mouth daily. 30 tablet 11  . atorvastatin (LIPITOR) 20 MG tablet Take 1 tablet (20 mg total) by mouth daily. 30 tablet 0  . glipiZIDE (GLUCOTROL) 10 MG tablet TAKE 1 TABLET TWICE A DAY BEFORE MEALS, BREAKFAST AND DINNER 180 tablet 0  . Lancets (ONETOUCH DELICA PLUS LANCET33G) MISC USE AS DIRECTED ONCE DAILY 100 each 2  . ONETOUCH DELICA LANCETS FINE MISC To test blood sugar 3 times daily 100 each 6  . glucose blood test strip Use to test blood sugars twice daily. 100 each 12  . Lancets (ONETOUCH ULTRASOFT) lancets Use as instructed 100 each 12   . losartan-hydrochlorothiazide (HYZAAR) 100-25 MG tablet TAKE 1 TABLET DAILY 90 tablet 3  . doxycycline (VIBRA-TABS) 100 MG tablet Take 1 tablet (100 mg total) by mouth 2 (two) times daily. (Patient not taking: Reported on 04/24/2019) 20 tablet 0  . HYDROcodone-acetaminophen (NORCO) 10-325 MG tablet Take 1 tablet by mouth every 6 (six) hours as needed. (Patient not taking: Reported on 04/24/2019) 10 tablet 0   No facility-administered medications prior to visit.    Allergies  Allergen Reactions  . Septra Ds [Sulfamethoxazole-Trimethoprim] Hives   SOCIAL HX:  reports that she has been smoking cigarettes. She has been smoking about 0.50 packs per day. She has never used smokeless tobacco. She reports that she does not drink alcohol or use drugs.   Current Outpatient Medications:  .  aspirin EC 81 MG tablet, Take 1 tablet (81 mg total) by mouth daily., Disp: 30 tablet, Rfl: 11 .  atorvastatin (LIPITOR) 20 MG tablet, Take 1 tablet (20 mg total) by mouth daily., Disp: 30 tablet, Rfl: 0 .  glipiZIDE (GLUCOTROL) 10 MG tablet, TAKE 1 TABLET TWICE A DAY BEFORE MEALS, BREAKFAST AND DINNER, Disp: 180 tablet, Rfl: 0 .  glucose blood test strip, Use to test blood sugars twice daily., Disp: 100 each, Rfl: 12 .  Lancets (ONETOUCH DELICA PLUS LANCET33G) MISC, USE AS DIRECTED ONCE DAILY, Disp: 100 each, Rfl: 2 .  Lancets (ONETOUCH ULTRASOFT) lancets, Use as instructed, Disp: 100 each, Rfl: 12 .  losartan-hydrochlorothiazide (HYZAAR) 100-25 MG tablet, Take 1 tablet by mouth daily., Disp: 90 tablet, Rfl: 3 .  ONETOUCH DELICA LANCETS FINE MISC, To test blood sugar 3 times daily, Disp: 100 each, Rfl: 6  EXAM:  VITALS per patient if applicable:  GENERAL: alert, oriented, appears well and in no acute distress  HEENT: atraumatic, conjunttiva clear, no obvious abnormalities on inspection of external nose and ears  NECK: normal movements of the head and neck  LUNGS: on inspection no signs of respiratory  distress, breathing rate appears normal, no obvious gross SOB, gasping or wheezing  CV: no obvious cyanosis  MS: moves all visible extremities without noticeable abnormality  PSYCH/NEURO: pleasant and cooperative, no obvious depression or anxiety, speech and thought processing grossly intact  ASSESSMENT AND PLAN:  Discussed the following assessment and plan:  Chronic pain of left thumb - Plan: Ambulatory referral to Orthopedic Surgery  Essential hypertension - Plan: losartan-hydrochlorothiazide (HYZAAR) 100-25 MG tablet, Comprehensive metabolic panel  Obesity, diabetes, and hypertension syndrome (HCC) - Plan: Microalbumin / creatinine urine ratio, Hemoglobin A1c, TSH, Lipid panel  Breast calcifications on mammogram  Obesity, morbid, BMI 40.0-49.9 (HCC)  Pure hypercholesterolemia  Essential hypertension Taking meds but has not checked BP in OVER A YEAR.  RN visit needed   Breast calcifications on mammogram Negative biopsy Oct 2019.  ANNUAL mammogram  Was ordered  I n November .   Obesity, morbid, BMI 40.0-49.9 She has lost 6 lbs since last year with diet. I am concerned that this may be due to uncontrolled diabetes  Obesity, diabetes, and hypertension syndrome (HCC) Lost to follow up due to unemployment followed by excessive work. Repeat labs are due   Lab Results  Component Value Date   HGBA1C 8.2 (H) 12/26/2017   Lab Results  Component Value Date   MICROALBUR 0.7 09/18/2017     Pure hypercholesterolemia her risk of heart attack over the next ten years is about 26% using the FRC.  I am recommending that she  resume Lipitor to lower her risk  Once her liver enzymes can be reviewed,  and to stop smoking .   Lab Results  Component Value Date   CHOL 147 12/26/2017   HDL 42.80 12/26/2017   LDLCALC 94 12/26/2017   LDLDIRECT 160.0 09/18/2017   TRIG 55.0 12/26/2017   CHOLHDL 3 12/26/2017       I discussed the assessment and treatment plan with the patient. The  patient was provided an opportunity to ask questions and all were answered. The patient agreed with the plan and demonstrated an understanding of the instructions.   The patient was advised to call back or seek an in-person evaluation if the symptoms worsen or if the condition fails to improve as anticipated.   I provided  25 minutes of non-face-to-face time during this encounter reviewing patient's current problems and past procedures/imaging studies, providing counseling on the above mentioned problems , and coordination  of care . Sherlene Shamseresa L Chapin Arduini, MD

## 2019-04-24 NOTE — Assessment & Plan Note (Signed)
Taking meds but has not checked BP in OVER A YEAR.  RN visit needed

## 2019-04-26 NOTE — Assessment & Plan Note (Addendum)
Negative biopsy Oct 2019.  ANNUAL mammogram  Was ordered I n November .

## 2019-04-26 NOTE — Assessment & Plan Note (Signed)
her risk of heart attack over the next ten years is about 26% using the FRC.  I am recommending that she  resume Lipitor to lower her risk  Once her liver enzymes can be reviewed,  and to stop smoking .   Lab Results  Component Value Date   CHOL 147 12/26/2017   HDL 42.80 12/26/2017   LDLCALC 94 12/26/2017   LDLDIRECT 160.0 09/18/2017   TRIG 55.0 12/26/2017   CHOLHDL 3 12/26/2017

## 2019-04-26 NOTE — Assessment & Plan Note (Signed)
She has lost 6 lbs since last year with diet. I am concerned that this may be due to uncontrolled diabetes

## 2019-04-26 NOTE — Assessment & Plan Note (Signed)
Lost to follow up due to unemployment followed by excessive work. Repeat labs are due   Lab Results  Component Value Date   HGBA1C 8.2 (H) 12/26/2017   Lab Results  Component Value Date   MICROALBUR 0.7 09/18/2017

## 2019-04-28 ENCOUNTER — Other Ambulatory Visit: Payer: Self-pay

## 2019-04-28 ENCOUNTER — Other Ambulatory Visit (INDEPENDENT_AMBULATORY_CARE_PROVIDER_SITE_OTHER): Payer: BC Managed Care – PPO

## 2019-04-28 DIAGNOSIS — E1159 Type 2 diabetes mellitus with other circulatory complications: Secondary | ICD-10-CM

## 2019-04-28 DIAGNOSIS — R748 Abnormal levels of other serum enzymes: Secondary | ICD-10-CM

## 2019-04-28 DIAGNOSIS — E1169 Type 2 diabetes mellitus with other specified complication: Secondary | ICD-10-CM

## 2019-04-28 DIAGNOSIS — I10 Essential (primary) hypertension: Secondary | ICD-10-CM

## 2019-04-28 DIAGNOSIS — E669 Obesity, unspecified: Secondary | ICD-10-CM

## 2019-04-28 LAB — COMPREHENSIVE METABOLIC PANEL
ALT: 216 U/L — ABNORMAL HIGH (ref 0–35)
AST: 129 U/L — ABNORMAL HIGH (ref 0–37)
Albumin: 3.6 g/dL (ref 3.5–5.2)
Alkaline Phosphatase: 155 U/L — ABNORMAL HIGH (ref 39–117)
BUN: 10 mg/dL (ref 6–23)
CO2: 29 mEq/L (ref 19–32)
Calcium: 9.2 mg/dL (ref 8.4–10.5)
Chloride: 106 mEq/L (ref 96–112)
Creatinine, Ser: 0.63 mg/dL (ref 0.40–1.20)
GFR: 116.74 mL/min (ref >60.00)
Glucose, Bld: 243 mg/dL — ABNORMAL HIGH (ref 70–99)
Potassium: 4.2 mEq/L (ref 3.5–5.1)
Sodium: 137 mEq/L (ref 135–145)
Total Bilirubin: 0.6 mg/dL (ref 0.2–1.2)
Total Protein: 7.1 g/dL (ref 6.0–8.3)

## 2019-04-28 LAB — LIPID PANEL
Cholesterol: 145 mg/dL (ref 0–200)
HDL: 51.3 mg/dL (ref >39.00)
LDL Cholesterol: 83 mg/dL (ref 0–99)
NonHDL: 93.43
Total CHOL/HDL Ratio: 3
Triglycerides: 51 mg/dL (ref 0.0–149.0)
VLDL: 10.2 mg/dL (ref 0.0–40.0)

## 2019-04-28 LAB — MICROALBUMIN / CREATININE URINE RATIO
Creatinine,U: 77.9 mg/dL
Microalb Creat Ratio: 0.9 mg/g (ref 0.0–30.0)
Microalb, Ur: <0.7 mg/dL (ref 0.0–1.9)

## 2019-04-28 LAB — HEMOGLOBIN A1C: Hgb A1c MFr Bld: 11.1 % — ABNORMAL HIGH (ref 4.6–6.5)

## 2019-04-28 LAB — TSH: TSH: 1.51 u[IU]/mL (ref 0.35–4.50)

## 2019-04-29 ENCOUNTER — Other Ambulatory Visit: Payer: Self-pay | Admitting: Internal Medicine

## 2019-04-29 DIAGNOSIS — E1165 Type 2 diabetes mellitus with hyperglycemia: Secondary | ICD-10-CM

## 2019-04-29 NOTE — Progress Notes (Signed)
amb e 

## 2019-04-29 NOTE — Addendum Note (Signed)
Addended by: Crecencio Mc on: 04/29/2019 11:08 PM   Modules accepted: Orders

## 2019-05-01 ENCOUNTER — Telehealth: Payer: Self-pay | Admitting: Internal Medicine

## 2019-05-01 NOTE — Chronic Care Management (AMB) (Signed)
  Care Management   Note  05/01/2019 Name: Katelyn Proctor MRN: [id number] DOB: 1961/03/12  Katelyn Proctor is a 58 y.o. year old female who is a primary care patient of Derrel Nip, Aris Everts, MD. I reached out to Langston Reusing by phone today in response to a referral sent by Katelyn Proctor's health plan.    Katelyn Proctor was given information about care management services today including:  1. Care management services include personalized support from designated clinical staff supervised by her physician, including individualized plan of care and coordination with other care providers 2. 24/7 contact phone numbers for assistance for urgent and routine care needs. 3. The patient may stop care management services at any time by phone call to the office staff.  Patient agreed to services and verbal consent obtained.   Follow up plan: Telephone appointment with CCM team member scheduled for:06/11/2019  Glenna Durand, [place], [place] [facility] ??[email or web address] ??[phone]

## 2019-05-05 ENCOUNTER — Telehealth: Payer: Self-pay

## 2019-05-05 ENCOUNTER — Telehealth: Payer: Self-pay | Admitting: Internal Medicine

## 2019-05-05 MED ORDER — ATORVASTATIN CALCIUM 20 MG PO TABS: 20.0000 mg | ORAL_TABLET | Freq: Every day | ORAL | 1 refills | Status: DC

## 2019-05-05 NOTE — Telephone Encounter (Signed)
I have sent the refill to her mail order,  Yes she should continue taking atorvastatin because there is no reason to stop it at this time.   If we do not find another cause we will suspend it.   Lab Results  Component Value Date   ALT 216 (H) 04/28/2019   AST 129 (H) 04/28/2019   ALKPHOS 155 (H) 04/28/2019   BILITOT 0.6 04/28/2019

## 2019-05-05 NOTE — Telephone Encounter (Signed)
Spoke with pt and informed her of Dr. Lupita Dawn message. Also scheduled pt for a lab appt to have the additional lab work done. Pt is aware of appt date and time.

## 2019-05-05 NOTE — Telephone Encounter (Signed)
your  liver enzymes are  elevated for unclear reasons: there are many possible causes,  So   That's why  I recommended that you return  for additional blood tests.  These will  rule out various infections and autoimmune disorders that can cause elevated liver enzymes (hepatitis, hemochromatosis, etc) .  99% of the time  these tests turn out to be normal and the cause of the liver enzyme abnormality is  a condition called "fatty liver " which shows up only on ultrasoun."  This is why are also getting an ultrasound of the liver to examine its size and general appearance , but also to  make sure there are no masses  Or bile duct blockage.   Regards,    Deborra Medina, MD

## 2019-05-05 NOTE — Telephone Encounter (Signed)
Spoke with pt and informed that Dr. Derrel Nip does want her to continue taking the atorvastatin and that she has refilled the medication and sent it to her mail order. Pt gave a verbal understanding.

## 2019-05-05 NOTE — Telephone Encounter (Signed)
Pt is scheduled for her abdominal ultrasound for her elevated liver enzymes. She would like a cb regarding what causes elevated liver enzymes. Pt cb # [phone]

## 2019-05-05 NOTE — Telephone Encounter (Signed)
Pt is due for a refill on her Atorvastatin. Was not sure if you would want to refill since her liver enzymes are elevated.

## 2019-05-06 ENCOUNTER — Other Ambulatory Visit: Payer: BC Managed Care – PPO

## 2019-05-11 ENCOUNTER — Ambulatory Visit
Admission: RE | Admit: 2019-05-11 | Discharge: 2019-05-11 | Disposition: A | Discharge status: Home / Self Care | Payer: BC Managed Care – PPO | Source: Ambulatory Visit | Attending: Internal Medicine | Admitting: Internal Medicine

## 2019-05-11 ENCOUNTER — Other Ambulatory Visit: Payer: Self-pay

## 2019-05-11 DIAGNOSIS — R748 Abnormal levels of other serum enzymes: Secondary | ICD-10-CM | POA: Insufficient documentation

## 2019-05-11 DIAGNOSIS — R7989 Other specified abnormal findings of blood chemistry: Secondary | ICD-10-CM | POA: Diagnosis not present

## 2019-06-11 ENCOUNTER — Telehealth: Payer: BC Managed Care – PPO

## 2019-06-12 ENCOUNTER — Ambulatory Visit: Payer: Self-pay | Admitting: Pharmacist

## 2019-06-12 NOTE — Chronic Care Management (AMB) (Signed)
  Chronic Care Management   Note  Late documentation from yesterday  06/12/2019 Name: DESREE LEAP MRN: [id number] DOB: 03/14/1961  Sampson Si is a 59 y.o. year old female who is a primary care patient of Tullo, Mar Daring, MD. The CCM team was consulted for assistance with chronic disease management and care coordination needs.    Attempted to contact patient as scheduled for medication management review. Left HIPAA compliant message for patient to return my call at her convenience.   Follow up plan: - Will collaborate w/ Care Guide to outreach to reschedule follow up with me. Will urge how imperative it is for [place] to work together to control BG.   [name], PharmD, [place], CPP Clinical Pharmacist [facility] [place] [employer] [phone]

## 2019-06-15 ENCOUNTER — Telehealth: Payer: Self-pay | Admitting: Internal Medicine

## 2019-06-17 NOTE — Chronic Care Management (AMB) (Signed)
°  Care Management   Note  06/17/2019 Name: ARNITRA SOKOLOSKI MRN: [id number] DOB: 21-Dec-1960  GEORGIANNA BAND is a 59 y.o. year old female who is a primary care patient of Darrick Huntsman, Mar Daring, MD. I reached out to Sampson Si by phone today in response to a referral sent by Ms. Leland Johns Minahan's health plan.    Ms. Demars was given information about care management services today including:  1. Care management services include personalized support from designated clinical staff supervised by her physician, including individualized plan of care and coordination with other care providers 2. 24/7 contact phone numbers for assistance for urgent and routine care needs. 3. The patient may stop care management services at any time by phone call to the office staff.  Patient agreed to services and verbal consent obtained.   Follow up plan: Telephone appointment with care management team member scheduled for:07/20/2019  Penne Lash, RMA Care Guide, Embedded Care Coordination [facility]  [place], [place] [address] Direct Dial: [phone] [email or web address]: [email or web address]

## 2019-07-20 ENCOUNTER — Ambulatory Visit: Payer: BC Managed Care – PPO | Admitting: Pharmacist

## 2019-07-20 DIAGNOSIS — E7800 Pure hypercholesterolemia, unspecified: Secondary | ICD-10-CM

## 2019-07-20 DIAGNOSIS — E1165 Type 2 diabetes mellitus with hyperglycemia: Secondary | ICD-10-CM

## 2019-07-20 MED ORDER — GLIPIZIDE 10 MG PO TABS: ORAL_TABLET | ORAL | 2 refills | Status: DC

## 2019-07-20 MED ORDER — RYBELSUS 3 MG PO TABS: 3.0000 mg | ORAL_TABLET | Freq: Every day | ORAL | 2 refills | Status: DC

## 2019-07-20 MED ORDER — ATORVASTATIN CALCIUM 20 MG PO TABS: 20.0000 mg | ORAL_TABLET | Freq: Every day | ORAL | 1 refills | Status: DC

## 2019-07-20 NOTE — Chronic Care Management (AMB) (Signed)
Chronic Care Management   Note  07/20/2019 Name: Katelyn Proctor MRN: [id number] DOB: 1960/05/23   Subjective:  Katelyn Proctor is a 59 y.o. year old female who is a primary care patient of Tullo, Aris Everts, MD. The CCM team was consulted for assistance with chronic disease management and care coordination needs.    Contacted patient for medication management review.  Review of patient status, including review of consultants reports, laboratory and other test data, was performed as part of comprehensive evaluation and provision of chronic care management services.   SDOH (Social Determinants of Health) assessments and interventions performed:  SDOH Interventions     Most Recent Value  SDOH Interventions  SDOH Interventions for the Following Domains  Physical Activity  Physical Activity Interventions  Other (Comments) [Discussed increased physical activity, patient set goal]       Objective:  Lab Results  Component Value Date   CREATININE 0.63 04/28/2019   CREATININE 0.83 12/26/2017   CREATININE 0.64 09/18/2017    Lab Results  Component Value Date   HGBA1C 11.1 (H) 04/28/2019       Component Value Date/Time   CHOL 145 04/28/2019 1350   TRIG 51.0 04/28/2019 1350   HDL 51.30 04/28/2019 1350   CHOLHDL 3 04/28/2019 1350   VLDL 10.2 04/28/2019 1350   LDLCALC 83 04/28/2019 1350   LDLDIRECT 160.0 09/18/2017 1413    Clinical ASCVD: No  The ASCVD Risk score Mikey Bussing DC Jr., et al., 2013) failed to calculate for the following reasons:   The systolic blood pressure is missing    BP Readings from Last 3 Encounters:  01/10/18 134/88  09/18/17 122/72  01/29/17 122/88    Allergies  Allergen Reactions  . Septra Ds [Sulfamethoxazole-Trimethoprim] Hives    Medications Reviewed Today    Reviewed by De Hollingshead, [facility] (Pharmacist) on 07/20/19 at 1546  Med List Status: <None>  Medication Order Taking? Sig Documenting Provider Last Dose Status Informant  aspirin EC 81 MG  tablet [id number] Yes Take 1 tablet (81 mg total) by mouth daily. Crecencio Mc, MD Taking Active   atorvastatin (LIPITOR) 20 MG tablet [id number] Yes Take 1 tablet (20 mg total) by mouth daily. Crecencio Mc, MD Taking Active   Cholecalciferol (VITAMIN D) 50 MCG (2000 UT) CAPS [id number] Yes Take 1,000 Units by mouth daily.  [provider] Taking Active         Discontinued 08/07/11 1328 (Error)   glipiZIDE (GLUCOTROL) 10 MG tablet [id number] Yes TAKE 1 TABLET TWICE A DAY BEFORE MEALS, BREAKFAST AND DINNER Crecencio Mc, MD Taking Active   glucose blood test strip [id number] Yes Use to test blood sugars twice daily. Crecencio Mc, MD Taking Active   Lancets [facility] Donaciano Eva PLUS [id number]) [place] [id number] Yes USE AS DIRECTED ONCE DAILY Crecencio Mc, MD Taking Active   Lancets [facility] ULTRASOFT) lancets [id number] Yes Use as instructed Crecencio Mc, MD Taking Active   losartan-hydrochlorothiazide [facility]) 100-25 MG tablet [id number] Yes Take 1 tablet by mouth daily. Crecencio Mc, MD Taking Active   [facility] LANCETS FINE [place] [id number] Yes To test blood sugar 3 times daily Crecencio Mc, MD Taking Active            Assessment:   Goals Addressed            This Visit's Progress     Patient Stated   . PharmD "I want to work on my diabetes" (pt-stated)  CARE PLAN ENTRY (see longtitudinal plan of care for additional care plan information)  Current Barriers:  . Diabetes: uncontrolled; complicated by chronic medical conditions including HTN, HLD, obesity, most recent A1c 11.1% . Most recent eGFR: >100 mL/min . Current antihyperglycemic regimen: glipizide 10 mg BID, requests a refill on this today o Hx metformin; notes that it caused significant diarrhea. Does not appear that she ever started metformin XR d/t cost, but refuses to try today o Is afraid of needles and is adamantly against injectable optoins . Denies episodes of hypoglycemia . Current meal  patterns- works 3rd shift o Breakfast: sometimes eats fruits, toast; sometimes baked sausage or bacon;  o Sleeps ~9/10 am - 2 pm o Supper: around 3-4 pm; tries to cook some vegetables; occasionally sandwich + fruit; sometimes chicken salad, sometimes corned beef sandwich; doesn't like burgers; does eat beans, peas,  o Snacks: takes fruit to work - pears, oranges; doesn't like apples o Drinks: water; occasional soda, but generally diet . Current exercise: walks in the mornings, about 1.5-2 hours on good weather days; notes she has not been doing this over the winter . Current blood glucose readings: has not been checking regularly because of hands hurting . Cardiovascular risk reduction: o Current hypertensive regimen: losartan/HCTZ 100/25 mg daily o Current hyperlipidemia regimen: atorvastatin 20 mg daily; requests a refill on this today o Current antiplatelet regimen: ASA 81 mg daily  Pharmacist Clinical Goal(s):  Marland Kitchen Over the next 90 days, patient will work with PharmD and primary care provider to address optimized medication management  Interventions: . Comprehensive medication review performed, medication list updated in electronic medical record . Discussed goal A1c, goal fasting, and goal 2 hour post prandial glucose . Reviewed micro and macrovascular risks of uncontrolled diabetes . 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 Discussed options of metformin XR vs trying Rybelsus, as patient against injectable options. If sugars still correlate w/ A1c of ~11%, likely that glucosuria w/ SGLT2 would be so significant that she would experience genitourinary infections. . Patient elected to start Rybelsus 3 mg once daily. Counseled to take on an empty stomach- for her, this would be when she wakes up ~3 pm and before eating her pre-work meal. For now, continue glipizide 10 mg BID.   Marland Kitchen Discussed checking glucose fasting and post prandial a few times weekly. Patient will check fastings when she wakes up ~3 pm before eating her pre-work meal . Sent refills on atorvastatin and glipizide as requested  Patient Self Care Activities:  . Patient will check blood glucose BID, document, and provide at future appointments . Patient will take medications as prescribed . Patient will contact provider with any episodes of hypoglycemia . Patient will report any questions or concerns to provider   Please see past updates related to this goal by clicking on the "Past Updates" button in the selected goal         Plan: - Scheduled f/u call 08/24/19  [name] Darnelle Maffucci, PharmD, BCACP, [place] Pharmacist [place] [phone]

## 2019-07-20 NOTE — Patient Instructions (Signed)
Visit Information  Goals Addressed            This Visit's Progress     Patient Stated   . PharmD "I want to work on my diabetes" (pt-stated)       [place] (see longtitudinal plan of care for additional care plan information)  Current Barriers:  . Diabetes: uncontrolled; complicated by chronic medical conditions including HTN, HLD, obesity, most recent A1c 11.1% . Most recent eGFR: >100 mL/min . Current antihyperglycemic regimen: glipizide 10 mg BID, requests a refill on this today o Hx metformin; notes that it caused significant diarrhea. Does not appear that she ever started metformin XR d/t cost, but refuses to try today o Is afraid of needles and is adamantly against injectable optoins . Denies episodes of hypoglycemia . Current meal patterns- works 3rd shift o Breakfast: sometimes eats fruits, toast; sometimes baked sausage or bacon;  o Sleeps ~9/10 am - 2 pm o Supper: around 3-4 pm; tries to cook some vegetables; occasionally sandwich + fruit; sometimes chicken salad, sometimes corned beef sandwich; doesn't like burgers; does eat beans, peas,  o Snacks: takes fruit to work - pears, oranges; doesn't like apples o Drinks: water; occasional soda, but generally diet . Current exercise: walks in the mornings, about 1.5-2 hours on good weather days; notes she has not been doing this over the winter . Current blood glucose readings: has not been checking regularly because of hands hurting . Cardiovascular risk reduction: o Current hypertensive regimen: losartan/HCTZ 100/25 mg daily o Current hyperlipidemia regimen: atorvastatin 20 mg daily; requests a refill on this today o Current antiplatelet regimen: ASA 81 mg daily  Pharmacist Clinical Goal(s):  Marland Kitchen Over the next 90 days, patient will work with PharmD and primary care provider to address optimized medication management  Interventions: . Comprehensive medication review performed, medication list updated in electronic  medical record . Discussed goal A1c, goal fasting, and goal 2 hour post prandial glucose . Reviewed micro and macrovascular risks of uncontrolled diabetes . 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 Discussed options of metformin XR vs trying Rybelsus, as patient against injectable options. If sugars still correlate w/ A1c of ~11%, likely that glucosuria w/ SGLT2 would be so significant that she would experience genitourinary infections. . Patient elected to start Rybelsus 3 mg once daily. Counseled to take on an empty stomach- for her, this would be when she wakes up ~3 pm and before eating her pre-work meal. For now, continue glipizide 10 mg BID.  Marland Kitchen Discussed checking glucose fasting and post prandial a few times weekly. Patient will check fastings when she wakes up ~3 pm before eating her pre-work meal . Sent refills on atorvastatin and glipizide as requested  Patient Self Care Activities:  . Patient will check blood glucose BID, document, and provide at future appointments . Patient will take medications as prescribed . Patient will contact provider with any episodes of hypoglycemia . Patient will report any questions or concerns to provider   Please see past updates related to this goal by clicking on the "Past Updates" button in the selected goal         Patient verbalizes understanding of instructions provided today.   Plan:  - Scheduled f/u call 08/24/19  [name] Darnelle Maffucci, PharmD, BCACP, CPP  Clinical Pharmacist  [place]  [phone]

## 2019-08-14 DIAGNOSIS — M1811 Unilateral primary osteoarthritis of first carpometacarpal joint, right hand: Secondary | ICD-10-CM | POA: Diagnosis not present

## 2019-08-14 DIAGNOSIS — M65312 Trigger thumb, left thumb: Secondary | ICD-10-CM | POA: Diagnosis not present

## 2019-08-14 DIAGNOSIS — R52 Pain, unspecified: Secondary | ICD-10-CM | POA: Diagnosis not present

## 2019-08-21 DIAGNOSIS — R52 Pain, unspecified: Secondary | ICD-10-CM | POA: Diagnosis not present

## 2019-08-21 DIAGNOSIS — M1811 Unilateral primary osteoarthritis of first carpometacarpal joint, right hand: Secondary | ICD-10-CM | POA: Diagnosis not present

## 2019-08-21 DIAGNOSIS — M1812 Unilateral primary osteoarthritis of first carpometacarpal joint, left hand: Secondary | ICD-10-CM | POA: Diagnosis not present

## 2019-08-21 DIAGNOSIS — M65312 Trigger thumb, left thumb: Secondary | ICD-10-CM | POA: Diagnosis not present

## 2019-08-24 ENCOUNTER — Ambulatory Visit: Payer: BC Managed Care – PPO | Admitting: Pharmacist

## 2019-08-24 DIAGNOSIS — E1165 Type 2 diabetes mellitus with hyperglycemia: Secondary | ICD-10-CM

## 2019-08-24 MED ORDER — RYBELSUS 7 MG PO TABS: 7.0000 mg | ORAL_TABLET | Freq: Every day | ORAL | 2 refills | Status: DC

## 2019-08-24 MED ORDER — GLIPIZIDE 10 MG PO TABS: 5.0000 mg | ORAL_TABLET | Freq: Two times a day (BID) | ORAL | 0 refills | Status: DC

## 2019-08-24 NOTE — Chronic Care Management (AMB) (Signed)
Chronic Care Management   Follow Up Note   08/24/2019 Name: Katelyn Proctor MRN: [id number] DOB: 02/19/1961  Referred by: Crecencio Mc, MD Reason for referral : Chronic Care Management (Medication Management)   Katelyn Proctor is a 59 y.o. year old female who is a primary care patient of Tullo, Aris Everts, MD. The CCM team was consulted for assistance with chronic disease management and care coordination needs.    Contacted patient for medication management review.   Review of patient status, including review of consultants reports, relevant laboratory and other test results, and collaboration with appropriate care team members and the patient's provider was performed as part of comprehensive patient evaluation and provision of chronic care management services.    SDOH (Social Determinants of Health) assessments performed: No See Care Plan activities for detailed interventions related to [facility])     Outpatient Encounter Medications as of 08/24/2019  Medication Sig  . glipiZIDE (GLUCOTROL) 10 MG tablet Take 0.5 tablets (5 mg total) by mouth 2 (two) times daily before a meal. TAKE 1 TABLET TWICE A DAY BEFORE MEALS, BREAKFAST AND DINNER  . [DISCONTINUED] glipiZIDE (GLUCOTROL) 10 MG tablet TAKE 1 TABLET TWICE A DAY BEFORE MEALS, BREAKFAST AND DINNER  . [DISCONTINUED] Semaglutide (RYBELSUS) 3 MG TABS Take 3 mg by mouth daily.  Marland Kitchen aspirin EC 81 MG tablet Take 1 tablet (81 mg total) by mouth daily.  Marland Kitchen atorvastatin (LIPITOR) 20 MG tablet Take 1 tablet (20 mg total) by mouth daily.  . Cholecalciferol (VITAMIN D) 50 MCG (2000 UT) CAPS Take 1,000 Units by mouth daily.   Marland Kitchen glucose blood test strip Use to test blood sugars twice daily.  . Lancets (ONETOUCH DELICA PLUS [id number]) MISC USE AS DIRECTED ONCE DAILY  . Lancets (ONETOUCH ULTRASOFT) lancets Use as instructed  . losartan-hydrochlorothiazide (HYZAAR) 100-25 MG tablet Take 1 tablet by mouth daily.  . meloxicam (MOBIC) 7.5 MG tablet Take 7.5 mg  by mouth daily.  Glory Rosebush DELICA LANCETS FINE MISC To test blood sugar 3 times daily  . Semaglutide (RYBELSUS) 7 MG TABS Take 7 mg by mouth daily.  . [DISCONTINUED] Ferrous Sulfate (IRON) 28 MG TABS Take by mouth.     No facility-administered encounter medications on file as of 08/24/2019.     Objective:   Goals Addressed            This Visit's Progress     Patient Stated   . PharmD "I want to work on my diabetes" (pt-stated)       [place] (see longtitudinal plan of care for additional care plan information)  Current Barriers:  . Diabetes: uncontrolled but improved per SMBG; complicated by chronic medical conditions including HTN, HLD, obesity, most recent A1c 11.1% . Most recent eGFR: >100 mL/min . Current antihyperglycemic regimen: glipizide 10 mg BID, Rybelsus 3 mg daily o Hx metformin; notes that it caused significant diarrhea. Does not appear that she ever started metformin XR d/t cost, but refuses to try today o Is afraid of needles and is adamantly against injectable optoins . Denies episodes of hypoglycemia, does report a fasting reading of 86 . Current blood glucose readings:  o Fastings: 86, 121; 162, 181, 200, 175, 108, 110, 125, 144, 136, 123, 185 . Cardiovascular risk reduction: o Current hypertensive regimen: losartan/HCTZ 100/25 mg daily o Current hyperlipidemia regimen: atorvastatin 20 mg daily o Current antiplatelet regimen: ASA 81 mg daily  Pharmacist Clinical Goal(s):  Marland Kitchen Over the next 90 days, patient will  work with PharmD and primary care provider to address optimized medication management  Interventions: . Inter-disciplinary care team collaboration (see longitudinal plan of care) . Increase Rybelsus to 7 mg daily. Patient was given a 90 day supply at last fill; she will take 2 of the tablets she currently has to utilize current supply. Will send prescription to [facility] but request they put on hold until patient requests.  . To reduce  risk of hypoglycemia, reduce glipizide to 5 mg BID. Patient concerned about splitting the tablets, asked if she could just take 10 mg QAM. Discussed duration of action of glipizide IR, and that suppertime elevations would not be covered with just 10 mg QAM. Patient verbalizes understanding. If she has a difficult time splitting, she will let me know and we can send a prescription for 5 mg tablet size.   Patient Self Care Activities:  . Patient will check blood glucose BID, document, and provide at future appointments . Patient will take medications as prescribed . Patient will contact provider with any episodes of hypoglycemia . Patient will report any questions or concerns to provider   Please see past updates related to this goal by clicking on the "Past Updates" button in the selected goal          Plan:  - Scheduled f/u call 09/28/19  [name] Darnelle Maffucci, PharmD, BCACP, [place] Pharmacist [place] [phone]

## 2019-08-24 NOTE — Patient Instructions (Signed)
Visit Information  Goals Addressed            This Visit's Progress     Patient Stated   . PharmD "I want to work on my diabetes" (pt-stated)       [place] (see longtitudinal plan of care for additional care plan information)  Current Barriers:  . Diabetes: uncontrolled but improved per SMBG; complicated by chronic medical conditions including HTN, HLD, obesity, most recent A1c 11.1% . Most recent eGFR: >100 mL/min . Current antihyperglycemic regimen: glipizide 10 mg BID, Rybelsus 3 mg daily o Hx metformin; notes that it caused significant diarrhea. Does not appear that she ever started metformin XR d/t cost, but refuses to try today o Is afraid of needles and is adamantly against injectable optoins . Denies episodes of hypoglycemia, does report a fasting reading of 86 . Current blood glucose readings:  o Fastings: 86, 121; 162, 181, 200, 175, 108, 110, 125, 144, 136, 123, 185 . Cardiovascular risk reduction: o Current hypertensive regimen: losartan/HCTZ 100/25 mg daily o Current hyperlipidemia regimen: atorvastatin 20 mg daily o Current antiplatelet regimen: ASA 81 mg daily  Pharmacist Clinical Goal(s):  Marland Kitchen Over the next 90 days, patient will work with PharmD and primary care provider to address optimized medication management  Interventions: . Inter-disciplinary care team collaboration (see longitudinal plan of care) . Increase Rybelsus to 7 mg daily. Patient was given a 90 day supply at last fill; she will take 2 of the tablets she currently has to utilize current supply. Will send prescription to [facility] but request they put on hold until patient requests.  . To reduce risk of hypoglycemia, reduce glipizide to 5 mg BID. Patient concerned about splitting the tablets, asked if she could just take 10 mg QAM. Discussed duration of action of glipizide IR, and that suppertime elevations would not be covered with just 10 mg QAM. Patient verbalizes understanding. If she  has a difficult time splitting, she will let me know and we can send a prescription for 5 mg tablet size.   Patient Self Care Activities:  . Patient will check blood glucose BID, document, and provide at future appointments . Patient will take medications as prescribed . Patient will contact provider with any episodes of hypoglycemia . Patient will report any questions or concerns to provider   Please see past updates related to this goal by clicking on the "Past Updates" button in the selected goal         Patient verbalizes understanding of instructions provided today.   Plan:  - Scheduled f/u call 09/28/19  [name] Darnelle Maffucci, PharmD, BCACP, CPP Clinical Pharmacist [place] [phone]

## 2019-09-25 DIAGNOSIS — M65312 Trigger thumb, left thumb: Secondary | ICD-10-CM | POA: Diagnosis not present

## 2019-09-25 DIAGNOSIS — M1811 Unilateral primary osteoarthritis of first carpometacarpal joint, right hand: Secondary | ICD-10-CM | POA: Diagnosis not present

## 2019-09-28 ENCOUNTER — Ambulatory Visit: Payer: BC Managed Care – PPO | Admitting: Pharmacist

## 2019-09-28 ENCOUNTER — Other Ambulatory Visit: Payer: Self-pay | Admitting: Internal Medicine

## 2019-09-28 DIAGNOSIS — E1165 Type 2 diabetes mellitus with hyperglycemia: Secondary | ICD-10-CM

## 2019-09-28 MED ORDER — JARDIANCE 10 MG PO TABS: 10.0000 mg | ORAL_TABLET | Freq: Every day | ORAL | 1 refills | Status: DC

## 2019-09-28 NOTE — Chronic Care Management (AMB) (Signed)
Chronic Care Management   Follow Up Note   09/28/2019 Name: Katelyn Proctor MRN: [id number] DOB: Jul 23, 1960  Referred by: Crecencio Mc, MD Reason for referral : Chronic Care Management (Medication Management)   Katelyn Proctor is a 59 y.o. year old female who is a primary care patient of Tullo, Aris Everts, MD. The CCM team was consulted for assistance with chronic disease management and care coordination needs.   Contacted patient for medication management review.    Review of patient status, including review of consultants reports, relevant laboratory and other test results, and collaboration with appropriate care team members and the patient's provider was performed as part of comprehensive patient evaluation and provision of chronic care management services.    SDOH (Social Determinants of Health) assessments performed: No See Care Plan activities for detailed interventions related to [facility])     Outpatient Encounter Medications as of 09/28/2019  Medication Sig  . glipiZIDE (GLUCOTROL) 10 MG tablet Take 0.5 tablets (5 mg total) by mouth 2 (two) times daily before a meal. TAKE 1 TABLET TWICE A DAY BEFORE MEALS, BREAKFAST AND DINNER  . aspirin EC 81 MG tablet Take 1 tablet (81 mg total) by mouth daily.  Marland Kitchen atorvastatin (LIPITOR) 20 MG tablet Take 1 tablet (20 mg total) by mouth daily.  . Cholecalciferol (VITAMIN D) 50 MCG (2000 UT) CAPS Take 1,000 Units by mouth daily.   . empagliflozin (JARDIANCE) 10 MG TABS tablet Take 10 mg by mouth daily.  Marland Kitchen glucose blood test strip Use to test blood sugars twice daily.  . Lancets (ONETOUCH DELICA PLUS [id number]) MISC USE AS DIRECTED ONCE DAILY  . Lancets (ONETOUCH ULTRASOFT) lancets Use as instructed  . losartan-hydrochlorothiazide (HYZAAR) 100-25 MG tablet Take 1 tablet by mouth daily.  . meloxicam (MOBIC) 7.5 MG tablet Take 7.5 mg by mouth daily.  Glory Rosebush DELICA LANCETS FINE MISC To test blood sugar 3 times daily  . [DISCONTINUED] Ferrous  Sulfate (IRON) 28 MG TABS Take by mouth.    . [DISCONTINUED] Semaglutide (RYBELSUS) 7 MG TABS Take 7 mg by mouth daily. (Patient not taking: Reported on 09/28/2019)   No facility-administered encounter medications on file as of 09/28/2019.     Objective:   Goals Addressed            This Visit's Progress     Patient Stated   . PharmD "I want to work on my diabetes" (pt-stated)       [place] (see longtitudinal plan of care for additional care plan information)  Current Barriers:  . Diabetes: uncontrolled but improved per SMBG; complicated by chronic medical conditions including HTN, HLD, obesity, most recent A1c 11.1% o Reports that she waited to finish supply of Rybelsus 3 mg daily, so just increased ~2 weeks ago. Since then, has significant nausea and GI upset throughout the day. Managing w/ ginger ale and graham crackers. Has not felt up to exercising. . Most recent eGFR: >100 mL/min . Current antihyperglycemic regimen: glipizide 5 mg BID, Rybelsus 7 mg daily o Hx metformin; notes that it caused significant diarrhea. Does not appear that she ever started metformin XR d/t cost, but refuses to try o Is afraid of needles and is adamantly against injectable options . Denies episodes of hypoglycemia  . Current blood glucose readings:  o Fastings: 100-140s . Cardiovascular risk reduction: o Current hypertensive regimen: losartan/HCTZ 100/25 mg daily o Current hyperlipidemia regimen: atorvastatin 20 mg daily o Current antiplatelet regimen: ASA 81 mg daily  Pharmacist Clinical Goal(s):  Marland Kitchen Over the next 90 days, patient will work with PharmD and primary care provider to address optimized medication management  Interventions: . Comprehensive medication review performed, medication list updated in electronic medical record . Inter-disciplinary care team collaboration (see longitudinal plan of care) . D/c Rybelsus d/t significant GI upset w/ therapeutic dose.  Marland Kitchen Discussed SGLT2  mechanism, side effects. Patient agreeable. Start Jardiance 10 mg daily. Counseled to remain well hydrated, and hold the medication if she has a day that she is sick and gets dehydrated. Patient verbalized understanding.   Patient Self Care Activities:  . Patient will check blood glucose BID, document, and provide at future appointments . Patient will take medications as prescribed . Patient will contact provider with any episodes of hypoglycemia . Patient will report any questions or concerns to provider   Please see past updates related to this goal by clicking on the "Past Updates" button in the selected goal          Plan:  - Scheduled f/u call in ~4 weeks  [name] Darnelle Maffucci, PharmD, [place], [place] Pharmacist [place] [phone]

## 2019-09-28 NOTE — Patient Instructions (Signed)
Visit Information  Goals Addressed            This Visit's Progress     Patient Stated   . PharmD "I want to work on my diabetes" (pt-stated)       [place] (see longtitudinal plan of care for additional care plan information)  Current Barriers:  . Diabetes: uncontrolled but improved per SMBG; complicated by chronic medical conditions including HTN, HLD, obesity, most recent A1c 11.1% o Reports that she waited to finish supply of Rybelsus 3 mg daily, so just increased ~2 weeks ago. Since then, has significant nausea and GI upset throughout the day. Managing w/ ginger ale and graham crackers. Has not felt up to exercising. . Most recent eGFR: >100 mL/min . Current antihyperglycemic regimen: glipizide 5 mg BID, Rybelsus 7 mg daily o Hx metformin; notes that it caused significant diarrhea. Does not appear that she ever started metformin XR d/t cost, but refuses to try o Is afraid of needles and is adamantly against injectable options . Denies episodes of hypoglycemia  . Current blood glucose readings:  o [place]: 100-140s . Cardiovascular risk reduction: o Current hypertensive regimen: losartan/HCTZ 100/25 mg daily o Current hyperlipidemia regimen: atorvastatin 20 mg daily o Current antiplatelet regimen: ASA 81 mg daily  Pharmacist Clinical Goal(s):  Marland Kitchen Over the next 90 days, patient will work with PharmD and primary care provider to address optimized medication management  Interventions: . Comprehensive medication review performed, medication list updated in electronic medical record . Inter-disciplinary care team collaboration (see longitudinal plan of care) . D/c Rybelsus d/t significant GI upset w/ therapeutic dose.  Marland Kitchen Discussed SGLT2 mechanism, side effects. Patient agreeable. Start Jardiance 10 mg daily. Counseled to remain well hydrated, and hold the medication if she has a day that she is sick and gets dehydrated. Patient verbalized understanding.   Patient Self Care  Activities:  . Patient will check blood glucose BID, document, and provide at future appointments . Patient will take medications as prescribed . Patient will contact provider with any episodes of hypoglycemia . Patient will report any questions or concerns to provider   Please see past updates related to this goal by clicking on the "Past Updates" button in the selected goal         Patient verbalizes understanding of instructions provided today.   Plan:  - Scheduled f/u call in ~4 weeks  [name] Darnelle Maffucci, PharmD, [place], [place] Pharmacist [place] [phone]

## 2019-10-13 ENCOUNTER — Ambulatory Visit: Payer: Self-pay | Admitting: Pharmacist

## 2019-10-13 DIAGNOSIS — E1165 Type 2 diabetes mellitus with hyperglycemia: Secondary | ICD-10-CM

## 2019-10-13 MED ORDER — GLUCOSE BLOOD VI STRP: ORAL_STRIP | 3 refills | Status: DC

## 2019-10-13 NOTE — Patient Instructions (Signed)
Visit Information  Goals Addressed            This Visit's Progress     Patient Stated   . PharmD "I want to work on my diabetes" (pt-stated)       CARE PLAN ENTRY (see longtitudinal plan of care for additional care plan information)  Current Barriers:  . Diabetes: uncontrolled but improved per SMBG; complicated by chronic medical conditions including HTN, HLD, obesity, most recent A1c 11.1% o Calls today noting that since she started the Jardiance, she's has itching, no energy, and "stomach problems". Notes that it was at least 5-7 days after finishing Rybelsus before these new symptoms started.  o Notes that she went to the track last night to start walking, and threw away everything in her refrigerator that "I didn't need" . Most recent eGFR: >100 mL/min . Current antihyperglycemic regimen: glipizide 5 mg QAM, Jardiance 10 mg daily  o Hx metformin; notes that it caused significant diarrhea. Does not appear that she ever started metformin XR d/t cost, but refuses to try o Is afraid of needles and is adamantly against injectable options o Hx Rybelsus - severe GI upset w/ 7 mg dose o Hx Jardiance - intolerance d/t itching, lack of energy, stomach problems . Denies episodes of hypoglycemia  . Cardiovascular risk reduction: o Current hypertensive regimen: losartan/HCTZ 100/25 mg daily o Current hyperlipidemia regimen: atorvastatin 20 mg daily o Current antiplatelet regimen: ASA 81 mg daily  Pharmacist Clinical Goal(s):  . Over the next 90 days, patient will work with PharmD and primary care provider to address optimized medication management  Interventions: . F/u scheduled in ~2 weeks. Patient will hold Jardiance, continue glipizide, and increase activity as well as check BG daily. Will review next steps at next appt. Likely increase glipizide dose vs addition of DPP4  Patient Self Care Activities:  . Patient will check blood glucose BID, document, and provide at future  appointments . Patient will take medications as prescribed . Patient will contact provider with any episodes of hypoglycemia . Patient will report any questions or concerns to provider   Please see past updates related to this goal by clicking on the "Past Updates" button in the selected goal         Patient verbalizes understanding of instructions provided today.    Plan:  - Will outreach as previously scheduled  [name] Travis, PharmD, BCACP, CPP Clinical Pharmacist [facility] [phone]  

## 2019-10-13 NOTE — Chronic Care Management (AMB) (Signed)
Chronic Care Management   Follow Up Note   10/13/2019 Name: Katelyn Proctor MRN: [id number] DOB: 1961-03-08  Referred by: Crecencio Mc, MD Reason for referral : Chronic Care Management (Medication Management)   Katelyn Proctor is a 59 y.o. year old female who is a primary care patient of Tullo, Aris Everts, MD. The CCM team was consulted for assistance with chronic disease management and care coordination needs.    Contacted patient for medication management review.   Review of patient status, including review of consultants reports, relevant laboratory and other test results, and collaboration with appropriate care team members and the patient's provider was performed as part of comprehensive patient evaluation and provision of chronic care management services.    SDOH (Social Determinants of Health) assessments performed: No See Care Plan activities for detailed interventions related to [facility])     Outpatient Encounter Medications as of 10/13/2019  Medication Sig  . aspirin EC 81 MG tablet Take 1 tablet (81 mg total) by mouth daily.  Marland Kitchen atorvastatin (LIPITOR) 20 MG tablet Take 1 tablet (20 mg total) by mouth daily.  . Cholecalciferol (VITAMIN D) 50 MCG (2000 UT) CAPS Take 1,000 Units by mouth daily.   . empagliflozin (JARDIANCE) 10 MG TABS tablet Take 10 mg by mouth daily.  Marland Kitchen glipiZIDE (GLUCOTROL) 10 MG tablet Take 0.5 tablets (5 mg total) by mouth 2 (two) times daily before a meal. TAKE 1 TABLET TWICE A DAY BEFORE MEALS, BREAKFAST AND DINNER  . glucose blood test strip Use to test blood sugars twice daily.  . Lancets (ONETOUCH DELICA PLUS [id number]) MISC USE AS DIRECTED ONCE DAILY  . Lancets (ONETOUCH ULTRASOFT) lancets Use as instructed  . losartan-hydrochlorothiazide (HYZAAR) 100-25 MG tablet Take 1 tablet by mouth daily.  . meloxicam (MOBIC) 7.5 MG tablet Take 7.5 mg by mouth daily.  Katelyn Proctor DELICA LANCETS FINE MISC To test blood sugar 3 times daily  . [DISCONTINUED] Ferrous  Sulfate (IRON) 28 MG TABS Take by mouth.     No facility-administered encounter medications on file as of 10/13/2019.     Objective:   Goals Addressed            This Visit's Progress     Patient Stated   . PharmD "I want to work on my diabetes" (pt-stated)       [place] (see longtitudinal plan of care for additional care plan information)  Current Barriers:  . Diabetes: uncontrolled but improved per SMBG; complicated by chronic medical conditions including HTN, HLD, obesity, most recent A1c 11.1% o Calls today noting that since she started the Jardiance, she's has itching, no energy, and "stomach problems". Notes that it was at least 5-7 days after finishing Rybelsus before these new symptoms started.  o Notes that she went to the track last night to start walking, and threw away everything in her refrigerator that "I didn't need" . Most recent eGFR: >100 mL/min . Current antihyperglycemic regimen: glipizide 5 mg QAM, Jardiance 10 mg daily  o Hx metformin; notes that it caused significant diarrhea. Does not appear that she ever started metformin XR d/t cost, but refuses to try o Is afraid of needles and is adamantly against injectable options o Hx Rybelsus - severe GI upset w/ 7 mg dose o Hx Jardiance - intolerance d/t itching, lack of energy, stomach problems . Denies episodes of hypoglycemia  . Cardiovascular risk reduction: o Current hypertensive regimen: losartan/HCTZ 100/25 mg daily o Current hyperlipidemia regimen: atorvastatin 20  mg daily o Current antiplatelet regimen: ASA 81 mg daily  Pharmacist Clinical Goal(s):  Marland Kitchen Over the next 90 days, patient will work with PharmD and primary care provider to address optimized medication management  Interventions: . F/u scheduled in ~2 weeks. Patient will hold Jardiance, continue glipizide, and increase activity as well as check BG daily. Will review next steps at next appt. Likely increase glipizide dose.   Patient Self  Care Activities:  . Patient will check blood glucose BID, document, and provide at future appointments . Patient will take medications as prescribed . Patient will contact provider with any episodes of hypoglycemia . Patient will report any questions or concerns to provider   Please see past updates related to this goal by clicking on the "Past Updates" button in the selected goal          Plan:  - Will outreach as previously scheduled  [name] Darnelle Maffucci, PharmD, [place], [place] Pharmacist [place] [phone]

## 2019-10-13 NOTE — Addendum Note (Signed)
Addended by: Lourena Simmonds on: 10/13/2019 03:12 PM   Modules accepted: Orders

## 2019-10-22 ENCOUNTER — Ambulatory Visit: Payer: BC Managed Care – PPO | Admitting: Pharmacist

## 2019-10-22 DIAGNOSIS — E1165 Type 2 diabetes mellitus with hyperglycemia: Secondary | ICD-10-CM

## 2019-10-22 MED ORDER — ONETOUCH ULTRASOFT LANCETS MISC: 3 refills | Status: DC

## 2019-10-22 MED ORDER — GLUCOSE BLOOD VI STRP: ORAL_STRIP | 3 refills | Status: DC

## 2019-10-22 MED ORDER — ONETOUCH VERIO FLEX SYSTEM W/DEVICE KIT: PACK | 3 refills | Status: AC

## 2019-10-22 NOTE — Patient Instructions (Signed)
Visit Information  Goals Addressed              This Visit's Progress     Patient Stated   .  PharmD "I want to work on my diabetes" (pt-stated)        [place] (see longtitudinal plan of care for additional care plan information)  Current Barriers:  . Diabetes: uncontrolled but improved per SMBG; complicated by chronic medical conditions including HTN, HLD, obesity, most recent A1c 11.1% o Calls today noting that she needs a new glucometer prescription, as hers is not working anymore . Most recent eGFR: >100 mL/min . Current antihyperglycemic regimen: glipizide 5 mg QAM, Jardiance 10 mg daily  o Hx metformin; notes that it caused significant diarrhea. Does not appear that she ever started metformin XR d/t cost, but refuses to try o Is afraid of needles and is adamantly against injectable options o Hx Rybelsus - severe GI upset w/ 7 mg dose o Hx Jardiance - intolerance d/t itching, lack of energy, stomach problems . Denies episodes of hypoglycemia  . Cardiovascular risk reduction: o Current hypertensive regimen: losartan/HCTZ 100/25 mg daily o Current hyperlipidemia regimen: atorvastatin 20 mg daily o Current antiplatelet regimen: ASA 81 mg daily  Pharmacist Clinical Goal(s):  Marland Kitchen Over the next 90 days, patient will work with PharmD and primary care provider to address optimized medication management  Interventions: . Sending prescription for generic glucometer today.   Patient Self Care Activities:  . Patient will check blood glucose BID, document, and provide at future appointments . Patient will take medications as prescribed . Patient will contact provider with any episodes of hypoglycemia . Patient will report any questions or concerns to provider   Please see past updates related to this goal by clicking on the "Past Updates" button in the selected goal         Patient verbalizes understanding of instructions provided today.   Plan:  - Will outreach next  week as scheduled  [name] Darnelle Maffucci, PharmD, [place], [place] Pharmacist [place] [phone]

## 2019-10-22 NOTE — Chronic Care Management (AMB) (Signed)
Chronic Care Management   Follow Up Note   10/22/2019 Name: Katelyn Proctor MRN: [id number] DOB: 1960-06-02  Referred by: Crecencio Mc, MD Reason for referral : Chronic Care Management (Medication Management)   Katelyn Proctor is a 59 y.o. year old female who is a primary care patient of Tullo, Aris Everts, MD. The CCM team was consulted for assistance with chronic disease management and care coordination needs.    Received call from patient today.   Review of patient status, including review of consultants reports, relevant laboratory and other test results, and collaboration with appropriate care team members and the patient's provider was performed as part of comprehensive patient evaluation and provision of chronic care management services.    SDOH (Social Determinants of Health) assessments performed: Yes See Care Plan activities for detailed interventions related to [facility])     Outpatient Encounter Medications as of 10/22/2019  Medication Sig  . aspirin EC 81 MG tablet Take 1 tablet (81 mg total) by mouth daily.  Marland Kitchen atorvastatin (LIPITOR) 20 MG tablet Take 1 tablet (20 mg total) by mouth daily.  . Cholecalciferol (VITAMIN D) 50 MCG (2000 UT) CAPS Take 1,000 Units by mouth daily.   . empagliflozin (JARDIANCE) 10 MG TABS tablet Take 10 mg by mouth daily.  Marland Kitchen glipiZIDE (GLUCOTROL) 10 MG tablet Take 0.5 tablets (5 mg total) by mouth 2 (two) times daily before a meal. TAKE 1 TABLET TWICE A DAY BEFORE MEALS, BREAKFAST AND DINNER  . glucose blood test strip Use to test blood sugars twice daily  . Lancets (ONETOUCH DELICA PLUS [id number]) MISC USE AS DIRECTED ONCE DAILY  . Lancets (ONETOUCH ULTRASOFT) lancets Use as instructed  . losartan-hydrochlorothiazide (HYZAAR) 100-25 MG tablet Take 1 tablet by mouth daily.  . meloxicam (MOBIC) 7.5 MG tablet Take 7.5 mg by mouth daily.  Glory Rosebush DELICA LANCETS FINE MISC To test blood sugar 3 times daily  . [DISCONTINUED] Ferrous Sulfate (IRON) 28 MG  TABS Take by mouth.     No facility-administered encounter medications on file as of 10/22/2019.     Objective:   Goals Addressed              This Visit's Progress     Patient Stated   .  PharmD "I want to work on my diabetes" (pt-stated)        [place] (see longtitudinal plan of care for additional care plan information)  Current Barriers:  . Diabetes: uncontrolled but improved per SMBG; complicated by chronic medical conditions including HTN, HLD, obesity, most recent A1c 11.1% o Calls today noting that she needs a new glucometer prescription, as hers is not working anymore . Most recent eGFR: >100 mL/min . Current antihyperglycemic regimen: glipizide 5 mg QAM, Jardiance 10 mg daily  o Hx metformin; notes that it caused significant diarrhea. Does not appear that she ever started metformin XR d/t cost, but refuses to try o Is afraid of needles and is adamantly against injectable options o Hx Rybelsus - severe GI upset w/ 7 mg dose o Hx Jardiance - intolerance d/t itching, lack of energy, stomach problems . Denies episodes of hypoglycemia  . Cardiovascular risk reduction: o Current hypertensive regimen: losartan/HCTZ 100/25 mg daily o Current hyperlipidemia regimen: atorvastatin 20 mg daily o Current antiplatelet regimen: ASA 81 mg daily  Pharmacist Clinical Goal(s):  Marland Kitchen Over the next 90 days, patient will work with PharmD and primary care provider to address optimized medication management  Interventions: . Sending  prescription for generic glucometer today.   Patient Self Care Activities:  . Patient will check blood glucose BID, document, and provide at future appointments . Patient will take medications as prescribed . Patient will contact provider with any episodes of hypoglycemia . Patient will report any questions or concerns to provider   Please see past updates related to this goal by clicking on the "Past Updates" button in the selected goal           Plan:  - Will outreach next week as scheduled  [name] Darnelle Maffucci, PharmD, [place], [place] Pharmacist [place] [phone]

## 2019-10-29 ENCOUNTER — Ambulatory Visit: Payer: BC Managed Care – PPO | Admitting: Pharmacist

## 2019-10-29 DIAGNOSIS — E7800 Pure hypercholesterolemia, unspecified: Secondary | ICD-10-CM

## 2019-10-29 DIAGNOSIS — E1165 Type 2 diabetes mellitus with hyperglycemia: Secondary | ICD-10-CM

## 2019-10-29 MED ORDER — GLIPIZIDE 10 MG PO TABS: 5.0000 mg | ORAL_TABLET | Freq: Two times a day (BID) | ORAL | 2 refills | Status: DC

## 2019-10-29 MED ORDER — GLIPIZIDE 5 MG PO TABS: 5.0000 mg | ORAL_TABLET | Freq: Every day | ORAL | 3 refills | Status: DC

## 2019-10-29 MED ORDER — ATORVASTATIN CALCIUM 20 MG PO TABS: 20.0000 mg | ORAL_TABLET | Freq: Every day | ORAL | 3 refills | Status: DC

## 2019-10-29 NOTE — Patient Instructions (Signed)
Visit Information  Goals Addressed              This Visit's Progress     Patient Stated   .  PharmD "I want to work on my diabetes" (pt-stated)        [place] (see longtitudinal plan of care for additional care plan information)  Current Barriers:  . Diabetes: uncontrolled but improved per SMBG; complicated by chronic medical conditions including HTN, HLD, obesity, most recent A1c 11.1%; over due for A1c and f/u with PCP.  o Reports that she picked up the new One Touch Verio, but that it keeps reading Error 2- problem with test strips. Notes that she took it to the pharmacy to get help o Notes that overall, she is feeling better than she did previously. . Most recent eGFR: >100 mL/min . Current antihyperglycemic regimen: glipizide 5 mg QAM o Hx metformin; notes that it caused significant diarrhea. Does not appear that she ever started metformin XR d/t cost, but refuses to try o Is afraid of needles and is adamantly against injectable options o Hx Rybelsus - severe GI upset w/ 7 mg dose o Hx Jardiance - intolerance d/t itching, lack of energy, stomach problems . Current meal patterns: o Takes 3 large water bottles to work with her every day. Is focusing on fruits, vegetables, and reduction in carbohydrates  . Denies episodes of hypoglycemia.  . Current glucose readings: not checking, issues w/ meter . Cardiovascular risk reduction: o Current hypertensive regimen: losartan/HCTZ 100/25 mg daily o Current hyperlipidemia regimen: atorvastatin 20 mg daily o Current antiplatelet regimen: ASA 81 mg daily  Pharmacist Clinical Goal(s):  Marland Kitchen Over the next 90 days, patient will work with PharmD and primary care provider to address optimized medication management  Interventions: . Comprehensive medication review performed, medication list updated in electronic medical record . Inter-disciplinary care team collaboration (see longitudinal plan of care) . Ordered A1c and BMP. Will  collaborate w/ office staff to schedule. Patient also due for f/u w/ PCP . Requests refill on glipizide and atorvastatin today. Order placed.  . Encouraged continued focus on dietary modification and increasing exercise. Will defer any medication additions/adjustments until A1c.   Patient Self Care Activities:  . Patient will check blood glucose BID, document, and provide at future appointments . Patient will take medications as prescribed . Patient will contact provider with any episodes of hypoglycemia . Patient will report any questions or concerns to provider   Please see past updates related to this goal by clicking on the "Past Updates" button in the selected goal         The patient verbalized understanding of instructions provided today and declined a print copy of patient instruction materials.   Plan:  - Scheduled f/u call in ~ 6 weeks  [name] Darnelle Maffucci, PharmD, [place], [place] Pharmacist [place] [phone]

## 2019-10-29 NOTE — Chronic Care Management (AMB) (Signed)
Chronic Care Management   Follow Up Note   10/29/2019 Name: SHENELL ROGALSKI MRN: [id number] DOB: 08-Jan-1961  Referred by: Crecencio Mc, MD Reason for referral : Chronic Care Management (Medication Management)   ALIZIA GREIF is a 59 y.o. year old female who is a primary care patient of Tullo, Aris Everts, MD. The CCM team was consulted for assistance with chronic disease management and care coordination needs.    Contacted patient for medication management review.   Review of patient status, including review of consultants reports, relevant laboratory and other test results, and collaboration with appropriate care team members and the patient's provider was performed as part of comprehensive patient evaluation and provision of chronic care management services.    SDOH (Social Determinants of Health) assessments performed: Yes See Care Plan activities for detailed interventions related to [facility])     Outpatient Encounter Medications as of 10/29/2019  Medication Sig   aspirin EC 81 MG tablet Take 1 tablet (81 mg total) by mouth daily.   atorvastatin (LIPITOR) 20 MG tablet Take 1 tablet (20 mg total) by mouth daily.   Blood Glucose Monitoring Suppl ([place]) w/Device KIT Use to check blood sugar up to TID.   glipiZIDE (GLUCOTROL) 5 MG tablet Take 1 tablet (5 mg total) by mouth daily before breakfast. TAKE 1 TABLET TWICE A DAY BEFORE MEALS, BREAKFAST AND DINNER   losartan-hydrochlorothiazide (HYZAAR) 100-25 MG tablet Take 1 tablet by mouth daily.   [DISCONTINUED] atorvastatin (LIPITOR) 20 MG tablet Take 1 tablet (20 mg total) by mouth daily.   [DISCONTINUED] glipiZIDE (GLUCOTROL) 10 MG tablet Take 0.5 tablets (5 mg total) by mouth 2 (two) times daily before a meal. TAKE 1 TABLET TWICE A DAY BEFORE MEALS, BREAKFAST AND DINNER   [DISCONTINUED] glipiZIDE (GLUCOTROL) 10 MG tablet Take 0.5 tablets (5 mg total) by mouth 2 (two) times daily before a meal. TAKE 1 TABLET  TWICE A DAY BEFORE MEALS, BREAKFAST AND DINNER   Cholecalciferol (VITAMIN D) 50 MCG (2000 UT) CAPS Take 1,000 Units by mouth daily.    glucose blood test strip Use to test blood sugars up to three times daily   Lancets (ONETOUCH ULTRASOFT) lancets Use as instructed to check sugars up to TID   meloxicam (MOBIC) 7.5 MG tablet Take 7.5 mg by mouth daily.   [DISCONTINUED] empagliflozin (JARDIANCE) 10 MG TABS tablet Take 10 mg by mouth daily. (Patient not taking: Reported on 10/29/2019)   [DISCONTINUED] Ferrous Sulfate (IRON) 28 MG TABS Take by mouth.     No facility-administered encounter medications on file as of 10/29/2019.     Objective:   Goals Addressed              This Visit's Progress     Patient Stated     PharmD "I want to work on my diabetes" (pt-stated)        CARE PLAN ENTRY (see longtitudinal plan of care for additional care plan information)  Current Barriers:   Diabetes: uncontrolled but improved per SMBG; complicated by chronic medical conditions including HTN, HLD, obesity, most recent A1c 11.1%; over due for A1c and f/u with PCP.  o Reports that she picked up the new One Touch Verio, but that it keeps reading Error 2- problem with test strips. Notes that she took it to the pharmacy to get help o Notes that overall, she is feeling better than she did previously.  Most recent eGFR: >100 mL/min  Current antihyperglycemic regimen: glipizide 5  mg QAM o Hx metformin; notes that it caused significant diarrhea. Does not appear that she ever started metformin XR d/t cost, but refuses to try o Is afraid of needles and is adamantly against injectable options o Hx Rybelsus - severe GI upset w/ 7 mg dose o Hx Jardiance - intolerance d/t itching, lack of energy, stomach problems  Current meal patterns: o Takes 3 large water bottles to work with her every day. Is focusing on fruits, vegetables, and reduction in carbohydrates   Denies episodes of hypoglycemia.    Current glucose readings: not checking, issues w/ meter  Cardiovascular risk reduction: o Current hypertensive regimen: losartan/HCTZ 100/25 mg daily o Current hyperlipidemia regimen: atorvastatin 20 mg daily o Current antiplatelet regimen: ASA 81 mg daily  Pharmacist Clinical Goal(s):   Over the next 90 days, patient will work with PharmD and primary care provider to address optimized medication management  Interventions:  Comprehensive medication review performed, medication list updated in electronic medical record  Inter-disciplinary care team collaboration (see longitudinal plan of care)  Ordered A1c and BMP. Will collaborate w/ office staff to schedule. Patient also due for f/u w/ PCP  Requests refill on glipizide and atorvastatin today. Order placed.   Encouraged continued focus on dietary modification and increasing exercise. Will defer any medication additions/adjustments until A1c.   Patient Self Care Activities:   Patient will check blood glucose BID, document, and provide at future appointments  Patient will take medications as prescribed  Patient will contact provider with any episodes of hypoglycemia  Patient will report any questions or concerns to provider   Please see past updates related to this goal by clicking on the "Past Updates" button in the selected goal          Plan:  - Scheduled f/u call in ~ 6 weeks  [name] Darnelle Maffucci, PharmD, [place], [place] Pharmacist [place] [phone]

## 2019-11-06 ENCOUNTER — Other Ambulatory Visit: Payer: Self-pay | Admitting: Internal Medicine

## 2019-11-06 ENCOUNTER — Telehealth: Payer: Self-pay

## 2019-11-06 DIAGNOSIS — E1165 Type 2 diabetes mellitus with hyperglycemia: Secondary | ICD-10-CM

## 2019-11-06 DIAGNOSIS — Z113 Encounter for screening for infections with a predominantly sexual mode of transmission: Secondary | ICD-10-CM

## 2019-11-06 MED ORDER — GLIPIZIDE 5 MG PO TABS: 5.0000 mg | ORAL_TABLET | Freq: Two times a day (BID) | ORAL | 3 refills | Status: DC

## 2019-11-06 NOTE — Telephone Encounter (Signed)
Clarification is needed on the Glipizide. Refill that was sent in has two different directions on it.

## 2019-11-06 NOTE — Telephone Encounter (Signed)
Tough to say,  She hasn't seen me since her last a1c  Of 11!   catie saw her last week and said she is taking 5 mg once daily.  Given her intolerance of multiple other medications,  I rewrote the directions for 5 mg twice daily.  Not sure whether it's going to mail order or local so I signed it as a phone in.   Can you arrange a follow up with me ASAP? She'll need labs first  Which I have ordered

## 2019-11-06 NOTE — Telephone Encounter (Signed)
Clarification was given to [facility] when they called back this morning.

## 2019-11-06 NOTE — Telephone Encounter (Signed)
Spoke with pt and informed her that she is due for a follow up and lab work with Dr. Darrick Huntsman. Pt stated that she has a lot of other appts coming up right now so she will call back when she can schedule an appt.

## 2019-11-12 LAB — HM DIABETES EYE EXAM

## 2019-12-10 ENCOUNTER — Telehealth: Payer: BC Managed Care – PPO

## 2019-12-10 ENCOUNTER — Telehealth: Payer: Self-pay | Admitting: Pharmacist

## 2019-12-10 NOTE — Telephone Encounter (Signed)
Patient returned call. Rescheduled for 8/16. Reminded that she needs to schedule w/ PCP.

## 2019-12-10 NOTE — Telephone Encounter (Signed)
°  Chronic Care Management   Note  12/10/2019 Name: Katelyn Proctor MRN: [id number] DOB: [birth year]  Attempted to contact patient for medication management review. Left HIPAA compliant message for patient to return my call at their convenience. Patient has lab work tomorrow, and also needs to see PCP.   Plan: - Will collaborate with Care Guide to outreach to schedule follow up with me in ~3-4 weeks   [name], PharmD, [place], CPP Clinical Pharmacist [facility] [employer] [phone]

## 2019-12-11 ENCOUNTER — Other Ambulatory Visit: Payer: Self-pay

## 2019-12-11 ENCOUNTER — Other Ambulatory Visit (INDEPENDENT_AMBULATORY_CARE_PROVIDER_SITE_OTHER): Payer: BC Managed Care – PPO

## 2019-12-11 DIAGNOSIS — R748 Abnormal levels of other serum enzymes: Secondary | ICD-10-CM

## 2019-12-11 DIAGNOSIS — E1165 Type 2 diabetes mellitus with hyperglycemia: Secondary | ICD-10-CM

## 2019-12-11 DIAGNOSIS — Z113 Encounter for screening for infections with a predominantly sexual mode of transmission: Secondary | ICD-10-CM

## 2019-12-11 LAB — LIPID PANEL
Cholesterol: 165 mg/dL (ref 0–200)
HDL: 60.8 mg/dL (ref >39.00)
LDL Cholesterol: 93 mg/dL (ref 0–99)
NonHDL: 104.62
Total CHOL/HDL Ratio: 3
Triglycerides: 57 mg/dL (ref 0.0–149.0)
VLDL: 11.4 mg/dL (ref 0.0–40.0)

## 2019-12-11 LAB — HEPATIC FUNCTION PANEL
ALT: 102 U/L — ABNORMAL HIGH (ref 0–35)
AST: 76 U/L — ABNORMAL HIGH (ref 0–37)
Albumin: 3.7 g/dL (ref 3.5–5.2)
Alkaline Phosphatase: 336 U/L — ABNORMAL HIGH (ref 39–117)
Bilirubin, Direct: 0.3 mg/dL (ref 0.0–0.3)
Total Bilirubin: 1 mg/dL (ref 0.2–1.2)
Total Protein: 7.2 g/dL (ref 6.0–8.3)

## 2019-12-11 LAB — IBC + FERRITIN
Ferritin: 431.8 ng/mL — ABNORMAL HIGH (ref 10.0–291.0)
Iron: 236 ug/dL — ABNORMAL HIGH (ref 42–145)
Saturation Ratios: 62 % — ABNORMAL HIGH (ref 20.0–50.0)
Transferrin: 272 mg/dL (ref 212.0–360.0)

## 2019-12-11 LAB — BASIC METABOLIC PANEL
BUN: 14 mg/dL (ref 6–23)
CO2: 25 mEq/L (ref 19–32)
Calcium: 9.8 mg/dL (ref 8.4–10.5)
Chloride: 103 mEq/L (ref 96–112)
Creatinine, Ser: 0.75 mg/dL (ref 0.40–1.20)
GFR: 95.26 mL/min (ref >60.00)
Glucose, Bld: 304 mg/dL — ABNORMAL HIGH (ref 70–99)
Potassium: 3.8 mEq/L (ref 3.5–5.1)
Sodium: 136 mEq/L (ref 135–145)

## 2019-12-11 LAB — HEMOGLOBIN A1C: Hgb A1c MFr Bld: 11.8 % — ABNORMAL HIGH (ref 4.6–6.5)

## 2019-12-13 NOTE — Progress Notes (Signed)
Her labs are very abnormal.  Please schedule her an appointment with me to discuss .  Meeting with [name] does not suffice

## 2019-12-14 LAB — ANTI-SMITH ANTIBODY: ENA SM Ab Ser-aCnc: <1 AI

## 2019-12-15 LAB — HEPATITIS B CORE ANTIBODY, TOTAL: Hep B Core Total Ab: NONREACTIVE

## 2019-12-15 LAB — HIV ANTIBODY (ROUTINE TESTING W REFLEX): HIV 1&2 Ab, 4th Generation: NONREACTIVE

## 2019-12-15 LAB — HEPATITIS B SURFACE ANTIBODY,QUALITATIVE: Hep B S Ab: NONREACTIVE

## 2019-12-15 LAB — HEPATITIS C ANTIBODY
Hepatitis C Ab: NONREACTIVE
SIGNAL TO CUT-OFF: 0.21 (ref <1.00)

## 2019-12-15 LAB — HEPATITIS B SURFACE ANTIGEN: Hepatitis B Surface Ag: NONREACTIVE

## 2019-12-15 LAB — MITOCHONDRIAL ANTIBODIES: Mitochondrial M2 Ab, IgG: <=20 U

## 2019-12-16 ENCOUNTER — Encounter: Payer: Self-pay | Admitting: Internal Medicine

## 2019-12-16 ENCOUNTER — Telehealth (INDEPENDENT_AMBULATORY_CARE_PROVIDER_SITE_OTHER): Payer: BC Managed Care – PPO | Admitting: Internal Medicine

## 2019-12-16 VITALS — Ht 62.99 in | Wt 188.0 lb

## 2019-12-16 DIAGNOSIS — E1165 Type 2 diabetes mellitus with hyperglycemia: Secondary | ICD-10-CM

## 2019-12-16 DIAGNOSIS — R790 Abnormal level of blood mineral: Secondary | ICD-10-CM

## 2019-12-16 DIAGNOSIS — R921 Mammographic calcification found on diagnostic imaging of breast: Secondary | ICD-10-CM

## 2019-12-16 HISTORY — DX: Abnormal level of blood mineral: R79.0

## 2019-12-16 NOTE — Assessment & Plan Note (Signed)
Checked during screening for causes of transaminitis.  Patient has been taking iron supplements to  manage fatigue.  Advised to stop .  Will recheck in a few weeks  Lab Results  Component Value Date   IRON 236 (H) 12/11/2019   FERRITIN 431.8 (H) 12/11/2019

## 2019-12-16 NOTE — Assessment & Plan Note (Signed)
Secondary to medication intolerance to jardiance and rybelsus.  Only taking glipizide 5 mg once daily.  Increase to bid ,  Follow up two weeks in office   Lab Results  Component Value Date   HGBA1C 11.8 (H) 12/11/2019

## 2019-12-16 NOTE — Assessment & Plan Note (Signed)
She was never called to set up the mammogram and [place] at 6 months.  Last mammogram Dec 2019.  Reordered today

## 2019-12-16 NOTE — Progress Notes (Signed)
Telephone Note  This visit type was conducted due to national recommendations for restrictions regarding the COVID-19 pandemic (e.g. social distancing).  This format is felt to be most appropriate for this patient at this time.  All issues noted in this document were discussed and addressed.  No physical exam was performed (except for noted visual exam findings with Video Visits).   I connected with@ on 12/16/19 at 11:30 AM EDT by telephone and verified that I am speaking with the correct person using two identifiers. Location patient: home Location provider: work or home office Persons participating in the virtual visit: patient, provider  I discussed the limitations, risks, security and privacy concerns of performing an evaluation and management service by telephone and the availability of in person appointments. I also discussed with the patient that there may be a patient responsible charge related to this service. The patient expressed understanding and agreed to proceed.  Reason for visit: uncontrolled type 2 DM,   Abnormal labs   HPI:  59 yr old female with uncontrolled type 2 DM,  Last seen by me on Dec 2020. Has been meeting regularly with [name] Darnelle Maffucci and new medications tried but not tolerated (Jardiance and Rybelsus .  Since June has been taking only glipizide 5 mg once daily.  Not checking sugars.  Walking 3 [place] daily with sister , has lost 20 lbs since December.  Diet mostly fruit and chicken.    Taking iron supplements for several weeks to manage fatigue.  No history of iron deficiency.  Iron is actually quite high on recent labs.  Never had her follow up mammogram, which was ordered in November.  Never received a call from office or from [place].   Lab Results  Component Value Date   ALT 102 (H) 12/11/2019   AST 76 (H) 12/11/2019   ALKPHOS 336 (H) 12/11/2019   BILITOT 1.0 12/11/2019   Lab Results  Component Value Date   IRON 236 (H) 12/11/2019   FERRITIN 431.8 (H)  12/11/2019   Lab Results  Component Value Date   HGBA1C 11.8 (H) 12/11/2019      ROS: Patient denies headache, fevers, malaise, unintentional weight loss, skin rash, eye pain, sinus congestion and sinus pain, sore throat, dysphagia,  hemoptysis , cough, dyspnea, wheezing, chest pain, palpitations, orthopnea, edema, abdominal pain, nausea, melena, diarrhea, constipation, flank pain, dysuria, hematuria, urinary  Frequency, nocturia, numbness, tingling, seizures,  Focal weakness, Loss of consciousness,  Tremor, insomnia, depression, anxiety, and suicidal ideation.      Past Medical History:  Diagnosis Date  . Anemia   . Chronic sinusitis   . Diabetes mellitus without complication ([place])   . GERD (gastroesophageal reflux disease)   . Hyperlipidemia   . Hypertension   . Tobacco abuse     Past Surgical History:  Procedure Laterality Date  . BREAST BIOPSY Right 02/20/2018   top hat, path pending  . CESAREAN SECTION    . NASAL SINUS SURGERY  2003  . TUBAL LIGATION  1995    Family History  Problem Relation Age of Onset  . Cancer Brother   . Coronary artery disease Mother   . Hypertension Mother   . Coronary artery disease Father   . Heart disease Father   . Diabetes Sister   . Stroke Paternal Uncle   . Coronary artery disease Sister 4  . Breast cancer Cousin 64    SOCIAL HX:  reports that she has been smoking cigarettes. She has been smoking about 0.50  packs per day. She has never used smokeless tobacco. She reports that she does not drink alcohol and does not use drugs.   Current Outpatient Medications:  .  aspirin EC 81 MG tablet, Take 1 tablet (81 mg total) by mouth daily., Disp: 30 tablet, Rfl: 11 .  atorvastatin (LIPITOR) 20 MG tablet, Take 1 tablet (20 mg total) by mouth daily., Disp: 90 tablet, Rfl: 3 .  Blood Glucose Monitoring Suppl ([place]) w/Device KIT, Use to check blood sugar up to TID., Disp: 1 kit, Rfl: 3 .  Cholecalciferol (VITAMIN D) 50  MCG (2000 UT) CAPS, Take 1,000 Units by mouth daily. , Disp: , Rfl:  .  glipiZIDE (GLUCOTROL) 5 MG tablet, Take 1 tablet (5 mg total) by mouth 2 (two) times daily before a meal., Disp: 180 tablet, Rfl: 3 .  glucose blood test strip, Use to test blood sugars up to three times daily, Disp: 300 each, Rfl: 3 .  Lancets (ONETOUCH ULTRASOFT) lancets, Use as instructed to check sugars up to TID, Disp: 300 each, Rfl: 3 .  losartan-hydrochlorothiazide (HYZAAR) 100-25 MG tablet, Take 1 tablet by mouth daily., Disp: 90 tablet, Rfl: 3 .  meloxicam (MOBIC) 7.5 MG tablet, Take 7.5 mg by mouth daily., Disp: , Rfl:   EXAM:   General impression: alert, cooperative and articulate.  No signs of being in distress  Lungs: speech is fluent sentence length suggests that patient is not short of breath and not punctuated by cough, sneezing or sniffing. Marland Kitchen   Psych: affect normal.  speech is articulate and non pressured .  Denies suicidal thoughts   ASSESSMENT AND PLAN:  Discussed the following assessment and plan:  Breast calcifications on mammogram - Plan: MM DIAG BREAST TOMO BILATERAL, US BREAST LTD UNI RIGHT INC AXILLA, US BREAST LTD UNI LEFT INC AXILLA  Uncontrolled type 2 diabetes mellitus with hyperglycemia (HCC)  Abnormal iron saturation  Uncontrolled type 2 diabetes mellitus ([place]) Secondary to medication intolerance to jardiance and rybelsus.  Only taking glipizide 5 mg once daily.  Increase to bid ,  Follow up two weeks in office   Lab Results  Component Value Date   HGBA1C 11.8 (H) 12/11/2019     Abnormal iron saturation Checked during screening for causes of transaminitis.  Patient has been taking iron supplements to  manage fatigue.  Advised to stop .  Will recheck in a few weeks  Lab Results  Component Value Date   IRON 236 (H) 12/11/2019   FERRITIN 431.8 (H) 12/11/2019     Breast calcifications on mammogram She was never called to set up the mammogram and [place] at 6 months.  Last  mammogram Dec 2019.  Reordered today    I discussed the assessment and treatment plan with the patient. The patient was provided an opportunity to ask questions and all were answered. The patient agreed with the plan and demonstrated an understanding of the instructions.   The patient was advised to call back or seek an in-person evaluation if the symptoms worsen or if the condition fails to improve as anticipated.  I provided  25 minutes of non-face-to-face time during this encounter.   Crecencio Mc, MD

## 2019-12-28 ENCOUNTER — Ambulatory Visit: Payer: BC Managed Care – PPO | Admitting: Pharmacist

## 2019-12-28 DIAGNOSIS — E1165 Type 2 diabetes mellitus with hyperglycemia: Secondary | ICD-10-CM

## 2019-12-28 NOTE — Patient Instructions (Signed)
Visit Information  Goals Addressed              This Visit's Progress     Patient Stated   .  PharmD "I want to work on my diabetes" (pt-stated)        [place] (see longtitudinal plan of care for additional care plan information)  Current Barriers:  . Social, financial, and community barriers: o Reports that she didn't sleep well last night because she lost power and isn't up to talking much today . Diabetes: uncontrolled but improved per SMBG; complicated by chronic medical conditions including HTN, HLD, obesity, most recent A1c 11.8% . Most recent eGFR: >100 mL/min . Current antihyperglycemic regimen: glipizide 5 mg BID- recently increased by PCP w/ last A1c o Hx metformin; notes that it caused significant diarrhea. Does not appear that she ever started metformin XR d/t cost, but refuses to try o Is afraid of needles and is adamantly against injectable options o Hx Rybelsus - GI upset w/ 7 mg dose o Hx Jardiance - intolerance d/t itching, lack of energy, stomach problems . Current glucose readings: o Reports fastings 170-240s . Cardiovascular risk reduction: o Current hypertensive regimen: losartan/HCTZ 100/25 mg daily o Current hyperlipidemia regimen: atorvastatin 20 mg daily o Current antiplatelet regimen: ASA 81 mg daily  Pharmacist Clinical Goal(s):  Marland Kitchen Over the next 90 days, patient will work with PharmD and primary care provider to address optimized medication management  Interventions: . Comprehensive medication review performed, medication list updated in electronic medical record . Inter-disciplinary care team collaboration (see longitudinal plan of care) . Planned to discuss next steps in therapy - could consider metformin XR vs pioglitazone vs insulin (unlikely that patient will consent to this). Patient notes that she does not want to talk about options because she wants to discuss with Dr. Derrel Nip first.   Patient Self Care Activities:  . Patient will  check blood glucose BID, document, and provide at future appointments . Patient will take medications as prescribed . Patient will contact provider with any episodes of hypoglycemia . Patient will report any questions or concerns to provider   Please see past updates related to this goal by clicking on the "Past Updates" button in the selected goal         The patient verbalized understanding of instructions provided today and declined a print copy of patient instruction materials.  Plan:  - Scheduled f/u call in ~ 6 weeks  [name] Darnelle Maffucci, PharmD, [place], [place] Pharmacist [place] [phone]

## 2019-12-28 NOTE — Chronic Care Management (AMB) (Signed)
Chronic Care Management   Follow Up Note   12/28/2019 Name: Katelyn Proctor MRN: [id number] DOB: 1960-11-14  Referred by: Crecencio Mc, MD Reason for referral : Chronic Care Management (Medication Management)   Katelyn Proctor is a 59 y.o. year old female who is a primary care patient of Tullo, Aris Everts, MD. The CCM team was consulted for assistance with chronic disease management and care coordination needs.    Contacted patient for medication management review.   Review of patient status, including review of consultants reports, relevant laboratory and other test results, and collaboration with appropriate care team members and the patient's provider was performed as part of comprehensive patient evaluation and provision of chronic care management services.    SDOH (Social Determinants of Health) assessments performed: No See Care Plan activities for detailed interventions related to [facility])     Outpatient Encounter Medications as of 12/28/2019  Medication Sig  . aspirin EC 81 MG tablet Take 1 tablet (81 mg total) by mouth daily.  Marland Kitchen atorvastatin (LIPITOR) 20 MG tablet Take 1 tablet (20 mg total) by mouth daily.  . Blood Glucose Monitoring Suppl ([place]) w/Device KIT Use to check blood sugar up to TID.  Marland Kitchen Cholecalciferol (VITAMIN D) 50 MCG (2000 UT) CAPS Take 1,000 Units by mouth daily.   Marland Kitchen glipiZIDE (GLUCOTROL) 5 MG tablet Take 1 tablet (5 mg total) by mouth 2 (two) times daily before a meal.  . glucose blood test strip Use to test blood sugars up to three times daily  . Lancets (ONETOUCH ULTRASOFT) lancets Use as instructed to check sugars up to TID  . losartan-hydrochlorothiazide (HYZAAR) 100-25 MG tablet Take 1 tablet by mouth daily.  . [DISCONTINUED] meloxicam (MOBIC) 7.5 MG tablet Take 7.5 mg by mouth daily.  . [DISCONTINUED] Ferrous Sulfate (IRON) 28 MG TABS Take by mouth.     No facility-administered encounter medications on file as of 12/28/2019.      Objective:   Goals Addressed              This Visit's Progress     Patient Stated   .  PharmD "I want to work on my diabetes" (pt-stated)        [place] (see longtitudinal plan of care for additional care plan information)  Current Barriers:  . Social, financial, and community barriers: o Reports that she didn't sleep well last night because she lost power and isn't up to talking much today . Diabetes: uncontrolled but improved per SMBG; complicated by chronic medical conditions including HTN, HLD, obesity, most recent A1c 11.8% . Most recent eGFR: >100 mL/min . Current antihyperglycemic regimen: glipizide 5 mg BID- recently increased by PCP w/ last A1c o Hx metformin; notes that it caused significant diarrhea. Does not appear that she ever started metformin XR d/t cost, but refuses to try o Is afraid of needles and is adamantly against injectable options o Hx Rybelsus - GI upset w/ 7 mg dose o Hx Jardiance - intolerance d/t itching, lack of energy, stomach problems . Current glucose readings: o Reports fastings 170-240s . Cardiovascular risk reduction: o Current hypertensive regimen: losartan/HCTZ 100/25 mg daily o Current hyperlipidemia regimen: atorvastatin 20 mg daily o Current antiplatelet regimen: ASA 81 mg daily  Pharmacist Clinical Goal(s):  Marland Kitchen Over the next 90 days, patient will work with PharmD and primary care provider to address optimized medication management  Interventions: . Comprehensive medication review performed, medication list updated in electronic medical  record . Inter-disciplinary care team collaboration (see longitudinal plan of care) . Planned to discuss next steps in therapy - could consider metformin XR vs pioglitazone vs insulin (unlikely that patient will consent to this). Patient notes that she does not want to talk about options because she wants to discuss with Dr. Derrel Nip first.   Patient Self Care Activities:  . Patient will check  blood glucose BID, document, and provide at future appointments . Patient will take medications as prescribed . Patient will contact provider with any episodes of hypoglycemia . Patient will report any questions or concerns to provider   Please see past updates related to this goal by clicking on the "Past Updates" button in the selected goal          Plan:  - Scheduled f/u call in ~ 6 weeks  Katelyn Proctor, PharmD, [place], [place] Pharmacist [place] [phone]

## 2020-01-05 ENCOUNTER — Other Ambulatory Visit: Payer: BC Managed Care – PPO

## 2020-01-08 ENCOUNTER — Ambulatory Visit
Admission: RE | Admit: 2020-01-08 | Discharge: 2020-01-08 | Disposition: A | Discharge status: Home / Self Care | Payer: BC Managed Care – PPO | Source: Ambulatory Visit | Attending: Internal Medicine | Admitting: Internal Medicine

## 2020-01-08 ENCOUNTER — Other Ambulatory Visit: Payer: Self-pay

## 2020-01-08 DIAGNOSIS — R921 Mammographic calcification found on diagnostic imaging of breast: Secondary | ICD-10-CM | POA: Diagnosis not present

## 2020-01-12 ENCOUNTER — Ambulatory Visit (INDEPENDENT_AMBULATORY_CARE_PROVIDER_SITE_OTHER): Payer: BC Managed Care – PPO | Admitting: Internal Medicine

## 2020-01-12 ENCOUNTER — Other Ambulatory Visit: Payer: Self-pay

## 2020-01-12 ENCOUNTER — Encounter: Payer: Self-pay | Admitting: Internal Medicine

## 2020-01-12 DIAGNOSIS — Z716 Tobacco abuse counseling: Secondary | ICD-10-CM | POA: Diagnosis not present

## 2020-01-12 DIAGNOSIS — R921 Mammographic calcification found on diagnostic imaging of breast: Secondary | ICD-10-CM | POA: Diagnosis not present

## 2020-01-12 DIAGNOSIS — I10 Essential (primary) hypertension: Secondary | ICD-10-CM | POA: Diagnosis not present

## 2020-01-12 DIAGNOSIS — E1165 Type 2 diabetes mellitus with hyperglycemia: Secondary | ICD-10-CM | POA: Diagnosis not present

## 2020-01-12 MED ORDER — GLIPIZIDE 5 MG PO TABS: 10.0000 mg | ORAL_TABLET | Freq: Two times a day (BID) | ORAL | 3 refills | Status: DC

## 2020-01-12 NOTE — Assessment & Plan Note (Signed)
Spent 3 minutes discussing risk of continued tobacco abuse, including but not limited to CAD, PAD, hypertension, and CA.  She is not interested in pharmacotherapy at this time.Smoking cessation instruction/counseling given: commended patient for reducing daily use. And encouraged  Patient to continue reduction in daily use by 1 cigarette every week 

## 2020-01-12 NOTE — Assessment & Plan Note (Signed)
Well controlled on current regimen. Renal function stable, no changes today. Lab Results  Component Value Date   CREATININE 0.75 12/11/2019   Lab Results  Component Value Date   NA 136 12/11/2019   K 3.8 12/11/2019   CL 103 12/11/2019   CO2 25 12/11/2019   Lab Results  Component Value Date   MICROALBUR <0.7 04/28/2019   MICROALBUR 0.7 09/18/2017

## 2020-01-12 NOTE — Progress Notes (Signed)
Subjective:  Patient ID: Katelyn Proctor, female    DOB: [birth year]  Age: 59 y.o. MRN: [id number]  CC: Diagnoses of Uncontrolled type 2 diabetes mellitus with hyperglycemia ([place]), Breast calcifications on mammogram, Essential hypertension, Tobacco abuse counseling, and Obesity, morbid, BMI 40.0-49.9 ([place]) were pertinent to this visit.  HPI Katelyn Proctor presents for follow up on uncontrolled type 2 DM, obesity and hypertension.   This visit occurred during the SARS-CoV-2 public health emergency.  Safety protocols were in place, including screening questions prior to the visit, additional usage of staff PPE, and extensive cleaning of exam room while observing appropriate contact time as indicated for disinfecting solutions.    Patient has received NO  doses of the available COVID 19 vaccines but is trying to arrange it.  Patient continues to mask when outside of the home except when walking in yard or at safe distances from others .  Patient denies any change in mood or development of unhealthy behaviors resuting from the pandemic's restriction of activities and socialization.     Uncontrolled DM:   She feels generally well, is walking several times per week and intentionally losing weight by eating more vegetables.  She is checking blood sugars once daily at variable times.  Marland Kitchen   Has been taking increased dose of glipizide since labs were done.  Works night shift.  Eats snacks during work .  Her morning  pre meal sugar  Has improved gradually and is currently 160 to 180. Has not found a breakfast item she likes (can't eat eggs) .  Often eats just dry toast. Stays up to till 10 am then goes to sleep until afternoon.  Largest meal is late afternoon.    Sugars are 160 to 180  On glipizide 5 mg twice daily.  Eating more vegetables.  Salads and raw vegetables  Cause sever abdominal  gas that   Tobacco abuse : smoking  6 cigs daily.   HTN:  Does not check BP at home.   Outpatient Medications Prior  to Visit  Medication Sig Dispense Refill  . aspirin EC 81 MG tablet Take 1 tablet (81 mg total) by mouth daily. 30 tablet 11  . atorvastatin (LIPITOR) 20 MG tablet Take 1 tablet (20 mg total) by mouth daily. 90 tablet 3  . Blood Glucose Monitoring Suppl ([place]) w/Device KIT Use to check blood sugar up to TID. 1 kit 3  . Cholecalciferol (VITAMIN D) 50 MCG (2000 UT) CAPS Take 1,000 Units by mouth daily.     Marland Kitchen glucose blood test strip Use to test blood sugars up to three times daily 300 each 3  . Lancets (ONETOUCH ULTRASOFT) lancets Use as instructed to check sugars up to TID 300 each 3  . losartan-hydrochlorothiazide (HYZAAR) 100-25 MG tablet Take 1 tablet by mouth daily. 90 tablet 3  . glipiZIDE (GLUCOTROL) 5 MG tablet Take 1 tablet (5 mg total) by mouth 2 (two) times daily before a meal. 180 tablet 3   No facility-administered medications prior to visit.    Review of Systems;  Patient denies headache, fevers, malaise, unintentional weight loss, skin rash, eye pain, sinus congestion and sinus pain, sore throat, dysphagia,  hemoptysis , cough, dyspnea, wheezing, chest pain, palpitations, orthopnea, edema, abdominal pain, nausea, melena, diarrhea, constipation, flank pain, dysuria, hematuria, urinary  Frequency, nocturia, numbness, tingling, seizures,  Focal weakness, Loss of consciousness,  Tremor, insomnia, depression, anxiety, and suicidal ideation.      Objective:  BP  138/82 (BP Location: Left Arm, Patient Position: Sitting)   Pulse 91   Temp 98.1 F (36.7 C)   Ht 5' 2.99" (1.6 m)   Wt 191 lb 9.6 oz (86.9 kg)   SpO2 98%   BMI 33.95 kg/m   BP Readings from Last 3 Encounters:  01/12/20 138/82  01/10/18 134/88  09/18/17 122/72    Wt Readings from Last 3 Encounters:  01/12/20 191 lb 9.6 oz (86.9 kg)  12/16/19 188 lb (85.3 kg)  04/24/19 208 lb (94.3 kg)    General appearance: alert, cooperative and appears stated age Ears: normal TM's and external ear  canals both ears Throat: lips, mucosa, and tongue normal; teeth and gums normal Neck: no adenopathy, no carotid bruit, supple, symmetrical, trachea midline and thyroid not enlarged, symmetric, no tenderness/mass/nodules Back: symmetric, no curvature. ROM normal. No CVA tenderness. Lungs: clear to auscultation bilaterally Heart: regular rate and rhythm, S1, S2 normal, no murmur, click, rub or gallop Abdomen: soft, non-tender; bowel sounds normal; no masses,  no organomegaly Pulses: 2+ and symmetric Skin: Skin color, texture, turgor normal. No rashes or lesions Lymph nodes: Cervical, supraclavicular, and axillary nodes normal. Foot exam:  Nails are well trimmed,  No callouses,  Sensation intact to microfilament  Lab Results  Component Value Date   HGBA1C 11.8 (H) 12/11/2019   HGBA1C 11.1 (H) 04/28/2019   HGBA1C 8.2 (H) 12/26/2017    Lab Results  Component Value Date   CREATININE 0.75 12/11/2019   CREATININE 0.63 04/28/2019   CREATININE 0.83 12/26/2017    Lab Results  Component Value Date   WBC 8.0 10/22/2016   HGB 14.4 10/22/2016   HCT 41.7 10/22/2016   PLT 336.0 10/22/2016   GLUCOSE 304 (H) 12/11/2019   CHOL 165 12/11/2019   TRIG 57.0 12/11/2019   HDL 60.80 12/11/2019   LDLDIRECT 160.0 09/18/2017   LDLCALC 93 12/11/2019   ALT 102 (H) 12/11/2019   AST 76 (H) 12/11/2019   NA 136 12/11/2019   K 3.8 12/11/2019   CL 103 12/11/2019   CREATININE 0.75 12/11/2019   BUN 14 12/11/2019   CO2 25 12/11/2019   TSH 1.51 04/28/2019   HGBA1C 11.8 (H) 12/11/2019   MICROALBUR <0.7 04/28/2019    MM DIAG BREAST TOMO BILATERAL  Result Date: 01/08/2020 CLINICAL DATA:  Follow-up 2nd group of indeterminate calcifications in the upper-outer right breast. One group was biopsied under stereotactic guidance on 02/20/2018 with pathology results of fibroadenomatous changes with coarse calcification. EXAM: DIGITAL DIAGNOSTIC BILATERAL MAMMOGRAM WITH TOMO AND CAD COMPARISON:  Previous exam(s). ACR  Breast Density Category b: There are scattered areas of fibroglandular density. FINDINGS: The previously demonstrated small group of probably benign calcifications in the upper-outer right breast which were not biopsied have a more coarse morphology today. There are 2 similar-appearing groups in the left breast. No interval findings suspicious for malignancy in either breast. Mammographic images were processed with CAD. IMPRESSION: 1. Bilateral benign calcifications compatible with additional areas of fibroadenomatous changes. 2. No evidence of malignancy in either breast. RECOMMENDATION: Bilateral screening mammogram in 1 year. I have discussed the findings and recommendations with the patient. If applicable, a reminder letter will be sent to the patient regarding the next appointment. BI-RADS CATEGORY  2: Benign. Electronically Signed   By: Claudie Revering M.D.   On: 01/08/2020 14:53    Assessment & Plan:   Problem List Items Addressed This Visit      Unprioritized   Breast calcifications on mammogram  Bilaeral, benign appearing by repeat imaging .  Follow up one year        Essential hypertension    Well controlled on current regimen. Renal function stable, no changes today. Lab Results  Component Value Date   CREATININE 0.75 12/11/2019   Lab Results  Component Value Date   NA 136 12/11/2019   K 3.8 12/11/2019   CL 103 12/11/2019   CO2 25 12/11/2019   Lab Results  Component Value Date   MICROALBUR <0.7 04/28/2019   MICROALBUR 0.7 09/18/2017           Obesity, morbid, BMI 40.0-49.9 ([place])    I have congratulated her in reduction of   BMI and encouraged  Continued weight loss with goal of 10% of body weigh over the next 6 months using a low glycemic index diet and regular exercise a minimum of 5 days per week.        Relevant Medications   glipiZIDE (GLUCOTROL) 5 MG tablet   Tobacco abuse counseling    Spent 3 minutes discussing risk of continued tobacco abuse, including but  not limited to CAD, PAD, hypertension, and CA.  She is not interested in pharmacotherapy at this time.Smoking cessation instruction/counseling given: commended patient for reducing daily use. And encouraged  Patient to continue reduction in daily use by 1 cigarette every week      Uncontrolled type 2 diabetes mellitus ([place])    Advised to check 2 hr post prandial evening/afternoon meal and increase glipizide dose to 10 mg for PP cbg of 160 or higher.  Continue 5 mg in the am.  Continue ASA, statin and ARB.Marland Kitchen  ASA continued due to high risk for CAD/PAD given ongoing tobacco abuse      Relevant Medications   glipiZIDE (GLUCOTROL) 5 MG tablet   Other Relevant Orders   Hemoglobin A1c   Comprehensive metabolic panel   Microalbumin / creatinine urine ratio      I have changed Virgie D. Mikami's glipiZIDE. I am also having her maintain her aspirin EC, losartan-hydrochlorothiazide, Vitamin D, OneTouch Verio Flex System, glucose blood, onetouch ultrasoft, and atorvastatin.  Meds ordered this encounter  Medications  . glipiZIDE (GLUCOTROL) 5 MG tablet    Sig: Take 2 tablets (10 mg total) by mouth 2 (two) times daily before a meal.    Dispense:  260 tablet    Refill:  3    Medications Discontinued During This Encounter  Medication Reason  . glipiZIDE (GLUCOTROL) 5 MG tablet     Follow-up: Return in about 3 months (around 04/12/2020) for follow up diabetes.   Crecencio Mc, MD

## 2020-01-12 NOTE — Assessment & Plan Note (Addendum)
Advised to check 2 hr post prandial evening/afternoon meal and increase glipizide dose to 10 mg for PP cbg of 160 or higher.  Continue 5 mg in the am.  Continue ASA, statin and ARB.Marland Kitchen  ASA continued due to high risk for CAD/PAD given ongoing tobacco abuse

## 2020-01-12 NOTE — Assessment & Plan Note (Signed)
I have congratulated her in reduction of   BMI and encouraged  Continued weight loss with goal of 10% of body weigh over the next 6 months using a low glycemic index diet and regular exercise a minimum of 5 days per week.    

## 2020-01-12 NOTE — Assessment & Plan Note (Addendum)
Bilaeral, benign appearing by repeat imaging .  Follow up one year

## 2020-01-12 NOTE — Patient Instructions (Addendum)
  You might want to try a premixed protein drink called Premier Protein shake in the morning.  It is less $$$ and very low sugar. [facility]   [employer] stores     160 cal  30 g protein  1 g sugar 50% calcium needs   Use the lactase enzyme supplement if you are lactose intolerant   Add peanut butter to your toast for the protein ,  Or a breakfast meat.    Fasting sugar goal is 130 or less   2 hr post prandial goal is 160    Take Beano before any meal containing vegetables.   Take your glipizide before your meals like you are doing    Check sugar 2 hours after evening meal.  If > 160,  You should increase the glipizide dose for that meal  to 10 mg  ( I hav refilled it for double the amount of 5 mg tablets)  Recheck your a1c on or after Oct 31

## 2020-01-29 ENCOUNTER — Other Ambulatory Visit: Payer: Self-pay | Admitting: Internal Medicine

## 2020-02-01 ENCOUNTER — Telehealth: Payer: Self-pay | Admitting: Internal Medicine

## 2020-02-01 DIAGNOSIS — E7800 Pure hypercholesterolemia, unspecified: Secondary | ICD-10-CM

## 2020-02-01 DIAGNOSIS — E1165 Type 2 diabetes mellitus with hyperglycemia: Secondary | ICD-10-CM

## 2020-02-01 MED ORDER — GLIPIZIDE 5 MG PO TABS: 10.0000 mg | ORAL_TABLET | Freq: Two times a day (BID) | ORAL | 3 refills | Status: DC

## 2020-02-01 MED ORDER — ATORVASTATIN CALCIUM 20 MG PO TABS: 20.0000 mg | ORAL_TABLET | Freq: Every day | ORAL | 3 refills | Status: DC

## 2020-02-01 NOTE — Telephone Encounter (Signed)
Pt needs a refill of glipiZIDE (GLUCOTROL) 5 MG tablet and atorvastatin (LIPITOR) 20 MG tablet [facility]

## 2020-02-16 ENCOUNTER — Telehealth: Payer: Self-pay | Admitting: Internal Medicine

## 2020-02-16 DIAGNOSIS — E1165 Type 2 diabetes mellitus with hyperglycemia: Secondary | ICD-10-CM

## 2020-02-16 NOTE — Telephone Encounter (Signed)
Patient saw Dr. Darrick Huntsman in August. She ordered her medication on 02/01/20 through [facility]. Patient has not received medication. Patient was advised to call [facility] today. Medication is glipiZIDE (GLUCOTROL) 5 MG tablet

## 2020-02-17 MED ORDER — GLIPIZIDE 5 MG PO TABS: 10.0000 mg | ORAL_TABLET | Freq: Two times a day (BID) | ORAL | 3 refills | Status: DC

## 2020-02-17 NOTE — Telephone Encounter (Signed)
Spoke with pt to let her know that the rx has been resent to [facility] because when it was sent in on 02/01/2020 it did not go electronically. Pt gave a verbal understanding.

## 2020-02-19 ENCOUNTER — Other Ambulatory Visit: Payer: BC Managed Care – PPO

## 2020-02-22 MED ORDER — GLIPIZIDE 5 MG PO TABS: 10.0000 mg | ORAL_TABLET | Freq: Two times a day (BID) | ORAL | 0 refills | Status: DC

## 2020-02-22 NOTE — Telephone Encounter (Signed)
Pt needs glipiZIDE (GLUCOTROL) 5 MG tablet ASAP. [facility] still states that they have not received rx. Pt is needing this medication ASAP

## 2020-02-22 NOTE — Addendum Note (Signed)
Addended by: Sandy Salaam on: 02/22/2020 10:03 AM   Modules accepted: Orders

## 2020-02-22 NOTE — Telephone Encounter (Signed)
Spoke with pt to let her know that I spoke with [facility] this morning and they stated that rx was shipped out on 02/18/2020. Pt stated that she still has not received it. Pt asked that we send a 30 day supply to her local pharmacy. rx has been sent.

## 2020-02-23 ENCOUNTER — Ambulatory Visit: Payer: BC Managed Care – PPO | Admitting: Pharmacist

## 2020-02-23 VITALS — Wt 184.0 lb

## 2020-02-23 DIAGNOSIS — E1165 Type 2 diabetes mellitus with hyperglycemia: Secondary | ICD-10-CM

## 2020-02-23 DIAGNOSIS — E7800 Pure hypercholesterolemia, unspecified: Secondary | ICD-10-CM

## 2020-02-23 NOTE — Chronic Care Management (AMB) (Signed)
Chronic Care Management   Follow Up Note   02/23/2020 Name: Katelyn Proctor MRN: [id number] DOB: May 31, 1960  Referred by: Katelyn Mc, MD Reason for referral : Chronic Care Management (Medication Management)   Katelyn Proctor is a 59 y.o. year old female who is a primary care patient of Tullo, Aris Everts, MD. The CCM team was consulted for assistance with chronic disease management and care coordination needs.    Contacted patient for medication management review.   Review of patient status, including review of consultants reports, relevant laboratory and other test results, and collaboration with appropriate care team members and the patient's provider was performed as part of comprehensive patient evaluation and provision of chronic care management services.    SDOH (Social Determinants of Health) assessments performed: Yes See Care Plan activities for detailed interventions related to SDOH)  SDOH Interventions     Most Recent Value  SDOH Interventions  Financial Strain Interventions Intervention Not Indicated       Outpatient Encounter Medications as of 02/23/2020  Medication Sig   aspirin EC 81 MG tablet Take 1 tablet (81 mg total) by mouth daily.   atorvastatin (LIPITOR) 20 MG tablet Take 1 tablet (20 mg total) by mouth daily.   Blood Glucose Monitoring Suppl ([place]) w/Device KIT Use to check blood sugar up to TID.   Cholecalciferol (VITAMIN D) 50 MCG (2000 UT) CAPS Take 1,000 Units by mouth daily.    glipiZIDE (GLUCOTROL) 5 MG tablet Take 2 tablets (10 mg total) by mouth 2 (two) times daily before a meal.   glucose blood test strip Use to test blood sugars up to three times daily   Lancets (ONETOUCH ULTRASOFT) lancets Use as instructed to check sugars up to TID   losartan-hydrochlorothiazide (HYZAAR) 100-25 MG tablet Take 1 tablet by mouth daily.   [DISCONTINUED] Ferrous Sulfate (IRON) 28 MG TABS Take by mouth.     No facility-administered  encounter medications on file as of 02/23/2020.     Objective:   Goals Addressed              This Visit's Progress     Patient Stated     PharmD "I want to work on my diabetes" (pt-stated)        [place] (see longtitudinal plan of care for additional care plan information)  Current Barriers:   Social, financial, and community barriers: o Upset with recent issue not being able to get glipizide prescription. Refill sent yesterday by PCP. Patient notes she will pick up tomorrow o Reports continued weight loss. Reports most recent home weight 184 lbs  Diabetes: uncontrolled but improved per SMBG; complicated by chronic medical conditions including HTN, HLD, obesity, most recent A1c 11.8%  Most recent eGFR: >100 mL/min  Current antihyperglycemic regimen: glipizide 10 mg BID o Hx metformin; notes that it caused significant diarrhea. Does not appear that she ever started metformin XR d/t cost, but refuses to try o Is afraid of needles and is adamantly against injectable options o Hx Rybelsus - GI upset w/ 7 mg dose o Hx Jardiance - intolerance d/t itching, lack of energy, stomach problems  Current exercise: still walking regularly several days weekly  Current glucose readings: o Fastings: 150-180  Cardiovascular risk reduction: o Current hypertensive regimen: losartan/HCTZ 100/25 mg daily, last clinic BP at goal o Current hyperlipidemia regimen: atorvastatin 20 mg daily o Current antiplatelet regimen: ASA 81 mg daily  Pharmacist Clinical Goal(s):   Over the  next 90 days, patient will work with PharmD and primary care provider to address optimized medication management  Interventions:  Comprehensive medication review performed, medication list updated in electronic medical record  Inter-disciplinary care team collaboration (see longitudinal plan of care)  Reviewed that glipizide refill was sent to the local pharmacy yesterday. Patient notes she will pick this  up tomorrow  Moving forward, could consider pioglitazone. Minimization of sulfonylurea dose will help reduce risk of pancreatic burnout and progression to requiring insulin therapy. Unfortunately, patient has not tolerated other mechanisms of action. Continue to monitor. Encouraged patient to contact the office if hypoglycemia develops.   Encouraged continued focus on at least 150 minutes of moderate intensity exercise weekly and focus on reducing carbohydrate intake   Patient Self Care Activities:   Patient will check blood glucose regularly, document, and provide at future appointments  Patient will take medications as prescribed  Patient will contact provider with any episodes of hypoglycemia  Patient will report any questions or concerns to provider   Please see past updates related to this goal by clicking on the "Past Updates" button in the selected goal          Plan:  - Scheduled f/u call in ~ 12 weeks (PCP f/u in ~ 6 weeks)  [name] Darnelle Maffucci, PharmD, [place], [place] Pharmacist [place] [phone]

## 2020-02-23 NOTE — Patient Instructions (Signed)
Visit Information  Goals Addressed              This Visit's Progress     Patient Stated   .  PharmD "I want to work on my diabetes" (pt-stated)        CARE PLAN ENTRY (see longtitudinal plan of care for additional care plan information)  Current Barriers:  . Social, financial, and community barriers: o Upset with recent issue not being able to get glipizide prescription. Refill sent yesterday by PCP. Patient notes she will pick up tomorrow o Reports continued weight loss. Reports most recent home weight 184 lbs . Diabetes: uncontrolled but improved per SMBG; complicated by chronic medical conditions including HTN, HLD, obesity, most recent A1c 11.8% . Most recent eGFR: >100 mL/min . Current antihyperglycemic regimen: glipizide 10 mg BID o Hx metformin; notes that it caused significant diarrhea. Does not appear that she ever started metformin XR d/t cost, but refuses to try o Is afraid of needles and is adamantly against injectable options o Hx Rybelsus - GI upset w/ 7 mg dose o Hx Jardiance - intolerance d/t itching, lack of energy, stomach problems . Current exercise: still walking regularly several days weekly . Current glucose readings: o Fastings: 150-180 . Cardiovascular risk reduction: o Current hypertensive regimen: losartan/HCTZ 100/25 mg daily, last clinic BP at goal o Current hyperlipidemia regimen: atorvastatin 20 mg daily o Current antiplatelet regimen: ASA 81 mg daily  Pharmacist Clinical Goal(s):  . Over the next 90 days, patient will work with PharmD and primary care provider to address optimized medication management  Interventions: . Comprehensive medication review performed, medication list updated in electronic medical record . Inter-disciplinary care team collaboration (see longitudinal plan of care) . Reviewed that glipizide refill was sent to the local pharmacy yesterday. Patient notes she will pick this up tomorrow . Moving forward, could consider  pioglitazone. Minimization of sulfonylurea dose will help reduce risk of pancreatic burnout and progression to requiring insulin therapy. Unfortunately, patient has not tolerated other mechanisms of action. Continue to monitor. Encouraged patient to contact the office if hypoglycemia develops.  . Encouraged continued focus on at least 150 minutes of moderate intensity exercise weekly and focus on reducing carbohydrate intake   Patient Self Care Activities:  . Patient will check blood glucose regularly, document, and provide at future appointments . Patient will take medications as prescribed . Patient will contact provider with any episodes of hypoglycemia . Patient will report any questions or concerns to provider   Please see past updates related to this goal by clicking on the "Past Updates" button in the selected goal         The patient verbalized understanding of instructions provided today and declined a print copy of patient instruction materials.    Plan:  - Scheduled f/u call in ~ 12 weeks (PCP f/u in ~ 6 weeks)  [name] Travis, PharmD, BCACP, CPP Clinical Pharmacist [facility] [phone]  

## 2020-02-24 NOTE — Telephone Encounter (Signed)
Pt called and said she went to [facility] about her medication glipizide and they told her they do not have it. [facility]'t [facility]. Pt would like a call about what is going on.

## 2020-02-24 NOTE — Telephone Encounter (Signed)
Called [facility] and they stated that they do have the rx it is just that per her insurance it is too early to fill because [facility] filled and shipped it out on 02/18/2020. Called pt to explain and pt stated that she has not received the medication yet. I advised pt that I would give [facility] a call to try and figure out what is going on. Pt gave a verbal understanding.

## 2020-02-25 ENCOUNTER — Other Ambulatory Visit: Payer: BC Managed Care – PPO

## 2020-02-26 ENCOUNTER — Other Ambulatory Visit: Payer: BC Managed Care – PPO

## 2020-02-28 NOTE — Progress Notes (Signed)
I have collaborated with the care management provider regarding care management and care coordination activities outlined in this encounter and have reviewed this encounter including documentation in the note and care plan. I am certifying that I agree with the content of this note and encounter as supervising physician.  Regards,   Yohanna Tow, MD    

## 2020-03-01 MED ORDER — GLIPIZIDE 10 MG PO TABS: 10.0000 mg | ORAL_TABLET | Freq: Two times a day (BID) | ORAL | 1 refills | Status: DC

## 2020-03-01 NOTE — Addendum Note (Signed)
Addended by: Sandy Salaam on: 03/01/2020 10:24 AM   Modules accepted: Orders

## 2020-03-01 NOTE — Telephone Encounter (Signed)
Rx has been resent for 10 mg tablet take one tablet by mouth twice daily.

## 2020-03-17 ENCOUNTER — Other Ambulatory Visit: Payer: Self-pay

## 2020-03-17 ENCOUNTER — Other Ambulatory Visit: Payer: Self-pay | Admitting: Internal Medicine

## 2020-03-17 ENCOUNTER — Other Ambulatory Visit (INDEPENDENT_AMBULATORY_CARE_PROVIDER_SITE_OTHER): Payer: BC Managed Care – PPO

## 2020-03-17 DIAGNOSIS — E1165 Type 2 diabetes mellitus with hyperglycemia: Secondary | ICD-10-CM

## 2020-03-17 LAB — POCT GLYCOSYLATED HEMOGLOBIN (HGB A1C): Hemoglobin A1C: 9.7 % — AB (ref 4.0–5.6)

## 2020-03-17 LAB — MICROALBUMIN / CREATININE URINE RATIO
Creatinine,U: 319.1 mg/dL
Microalb Creat Ratio: 1.1 mg/g (ref 0.0–30.0)
Microalb, Ur: 3.6 mg/dL — ABNORMAL HIGH (ref 0.0–1.9)

## 2020-03-17 MED ORDER — PIOGLITAZONE HCL 15 MG PO TABS: 15.0000 mg | ORAL_TABLET | Freq: Every day | ORAL | 1 refills | Status: DC

## 2020-03-17 NOTE — Progress Notes (Signed)
A1c improved compared to last check but still wildly out of control on glipizide alone.  Using glipizide alone  without other medications is going to cause her pancreas to "Burn out" quicker,  and she will become insulin dependent sooner, which we are trying to avoid, and there will be no going back once that happens.   1) confirm which dose of glipizide she is taking > there are 2 in chart,  called in one week apart.   2) I recommend that she try ADDING a second medication called Actos (Katelyn Proctor agrees) and will send it to her pharmacy .  Follow up in ONE MONTH with blood sugars

## 2020-03-17 NOTE — Addendum Note (Signed)
Addended by: Sherlene Shams on: 03/17/2020 01:33 PM   Modules accepted: Orders

## 2020-03-17 NOTE — Addendum Note (Signed)
Addended by: Warden Fillers on: 03/17/2020 09:35 AM   Modules accepted: Orders

## 2020-03-18 ENCOUNTER — Other Ambulatory Visit: Payer: BC Managed Care – PPO

## 2020-03-21 NOTE — Addendum Note (Signed)
Addended by: Warden Fillers on: 03/21/2020 04:59 PM   Modules accepted: Orders

## 2020-03-23 ENCOUNTER — Other Ambulatory Visit: Payer: Self-pay | Admitting: Internal Medicine

## 2020-03-23 DIAGNOSIS — I10 Essential (primary) hypertension: Secondary | ICD-10-CM

## 2020-03-24 ENCOUNTER — Other Ambulatory Visit: Payer: Self-pay

## 2020-03-24 ENCOUNTER — Other Ambulatory Visit (INDEPENDENT_AMBULATORY_CARE_PROVIDER_SITE_OTHER): Payer: BC Managed Care – PPO

## 2020-03-24 ENCOUNTER — Ambulatory Visit (INDEPENDENT_AMBULATORY_CARE_PROVIDER_SITE_OTHER): Payer: BC Managed Care – PPO

## 2020-03-24 ENCOUNTER — Other Ambulatory Visit: Payer: BC Managed Care – PPO

## 2020-03-24 DIAGNOSIS — E1165 Type 2 diabetes mellitus with hyperglycemia: Secondary | ICD-10-CM

## 2020-03-24 LAB — COMPREHENSIVE METABOLIC PANEL WITH GFR
ALT: 44 U/L — ABNORMAL HIGH (ref 0–35)
AST: 32 U/L (ref 0–37)
Albumin: 4 g/dL (ref 3.5–5.2)
Alkaline Phosphatase: 146 U/L — ABNORMAL HIGH (ref 39–117)
BUN: 18 mg/dL (ref 6–23)
CO2: 30 meq/L (ref 19–32)
Calcium: 9.5 mg/dL (ref 8.4–10.5)
Chloride: 101 meq/L (ref 96–112)
Creatinine, Ser: 0.68 mg/dL (ref 0.40–1.20)
GFR: 94.52 mL/min
Glucose, Bld: 224 mg/dL — ABNORMAL HIGH (ref 70–99)
Potassium: 3.7 meq/L (ref 3.5–5.1)
Sodium: 137 meq/L (ref 135–145)
Total Bilirubin: 0.9 mg/dL (ref 0.2–1.2)
Total Protein: 6.9 g/dL (ref 6.0–8.3)

## 2020-03-24 NOTE — Progress Notes (Signed)
Patient presented to clinic to compare patients glucometer to the clinic glucometer. Patient BS was 218 on clinics glucometer and 234 on patient glucometer.

## 2020-03-25 ENCOUNTER — Other Ambulatory Visit: Payer: Self-pay | Admitting: Internal Medicine

## 2020-03-25 DIAGNOSIS — R790 Abnormal level of blood mineral: Secondary | ICD-10-CM

## 2020-03-25 DIAGNOSIS — R748 Abnormal levels of other serum enzymes: Secondary | ICD-10-CM

## 2020-03-25 NOTE — Progress Notes (Signed)
Patient had elevated iron levels in August and had been taking iron (unadvised) for fatigue.  She was advised to stop taking all iron supplements and return in one month for iron studies.  This was never done.  She needs to have another lab visit to recheck these levels ASAP.  Too much iron can cause liver damage and her liver enzymes are still elevated, suggesting that this might be going on.    The lbas have been ordered.

## 2020-04-01 ENCOUNTER — Other Ambulatory Visit (INDEPENDENT_AMBULATORY_CARE_PROVIDER_SITE_OTHER): Payer: BC Managed Care – PPO

## 2020-04-01 ENCOUNTER — Other Ambulatory Visit: Payer: Self-pay

## 2020-04-01 DIAGNOSIS — R790 Abnormal level of blood mineral: Secondary | ICD-10-CM

## 2020-04-01 DIAGNOSIS — R748 Abnormal levels of other serum enzymes: Secondary | ICD-10-CM | POA: Diagnosis not present

## 2020-04-01 LAB — IBC + FERRITIN
Ferritin: 162.4 ng/mL (ref 10.0–291.0)
Iron: 102 ug/dL (ref 42–145)
Saturation Ratios: 26.1 % (ref 20.0–50.0)
Transferrin: 279 mg/dL (ref 212.0–360.0)

## 2020-04-01 LAB — CBC WITH DIFFERENTIAL/PLATELET
Basophils Absolute: 0 10*3/uL (ref 0.0–0.1)
Basophils Relative: 0.7 % (ref 0.0–3.0)
Eosinophils Absolute: 0.1 10*3/uL (ref 0.0–0.7)
Eosinophils Relative: 1.2 % (ref 0.0–5.0)
HCT: 42.5 % (ref 36.0–46.0)
Hemoglobin: 14.6 g/dL (ref 12.0–15.0)
Lymphocytes Relative: 35.7 % (ref 12.0–46.0)
Lymphs Abs: 1.8 10*3/uL (ref 0.7–4.0)
MCHC: 34.3 g/dL (ref 30.0–36.0)
MCV: 94.5 fl (ref 78.0–100.0)
Monocytes Absolute: 0.4 10*3/uL (ref 0.1–1.0)
Monocytes Relative: 8.4 % (ref 3.0–12.0)
Neutro Abs: 2.8 10*3/uL (ref 1.4–7.7)
Neutrophils Relative %: 54 % (ref 43.0–77.0)
Platelets: 250 10*3/uL (ref 150.0–400.0)
RBC: 4.5 Mil/uL (ref 3.87–5.11)
RDW: 12.6 % (ref 11.5–15.5)
WBC: 5.2 10*3/uL (ref 4.0–10.5)

## 2020-04-06 NOTE — Progress Notes (Signed)
Her repeat CBC and iron stores are no longer elevated.  DO NOT RESUME ANY IRON  YOU DO NOT NEED IT

## 2020-04-12 ENCOUNTER — Ambulatory Visit (INDEPENDENT_AMBULATORY_CARE_PROVIDER_SITE_OTHER): Payer: BC Managed Care – PPO | Admitting: Internal Medicine

## 2020-04-12 ENCOUNTER — Other Ambulatory Visit: Payer: Self-pay

## 2020-04-12 ENCOUNTER — Encounter: Payer: Self-pay | Admitting: Internal Medicine

## 2020-04-12 VITALS — BP 130/84 | HR 77 | Temp 98.2°F | Resp 15 | Ht 62.0 in | Wt 187.6 lb

## 2020-04-12 DIAGNOSIS — I10 Essential (primary) hypertension: Secondary | ICD-10-CM | POA: Diagnosis not present

## 2020-04-12 DIAGNOSIS — R790 Abnormal level of blood mineral: Secondary | ICD-10-CM

## 2020-04-12 DIAGNOSIS — E1165 Type 2 diabetes mellitus with hyperglycemia: Secondary | ICD-10-CM

## 2020-04-12 DIAGNOSIS — Z716 Tobacco abuse counseling: Secondary | ICD-10-CM | POA: Diagnosis not present

## 2020-04-12 LAB — POCT GLYCOSYLATED HEMOGLOBIN (HGB A1C): Hemoglobin A1C: 9.6 % — AB (ref 4.0–5.6)

## 2020-04-12 MED ORDER — SITAGLIPTIN PHOSPHATE 50 MG PO TABS: 50.0000 mg | ORAL_TABLET | Freq: Every day | ORAL | 0 refills | Status: DC

## 2020-04-12 NOTE — Assessment & Plan Note (Signed)
Spent 3 minutes discussing risk of continued tobacco abuse, including but not limited to CAD, PAD, hypertension, and CA.  She is not interested in pharmacotherapy at this time.Smoking cessation instruction/counseling given: commended patient for reducing daily use. And encouraged  Patient to continue reduction in daily use by 1 cigarette every week 

## 2020-04-12 NOTE — Assessment & Plan Note (Signed)
Adding Januvia to current regimen of Actos and glipizide.  Encouraged her to start exercising daily as it resulted in lower blood sugars .  Reviewed the new insulin delivery systems that allow painfree administration and her  inevitable  need for insulin   Lab Results  Component Value Date   HGBA1C 9.6 (A) 04/12/2020

## 2020-04-12 NOTE — Progress Notes (Signed)
Subjective:  Patient ID: Katelyn Proctor, female    DOB: 1960/09/09  Age: 59 y.o. MRN: [id number]  CC: The primary encounter diagnosis was Uncontrolled type 2 diabetes mellitus with hyperglycemia ([place]). Diagnoses of Tobacco abuse counseling, Essential hypertension, and Abnormal iron saturation were also pertinent to this visit.  HPI Katelyn Proctor presents for FOLLOW UP ON UNCONTROLLED TYPE 2 DM  This visit occurred during the SARS-CoV-2 public health emergency.  Safety protocols were in place, including screening questions prior to the visit, additional usage of staff PPE, and extensive cleaning of exam room while observing appropriate contact time as indicated for disinfecting solutions.   59 yr old female with uncontrolled DM due to multiple intolerances (see below)  Here for follow up.  Onlt taking glipizide 10 mg bid since Nov 1,  And actos 15 mg daily    Diet is austere, nuts and fruit at work ,  Works 3rd shift  11 pm to 7 am:  Comes home and eats toast and bacon/sausage  Or peanut butter and celery . Checks sugar when  She wakes  Up at  2 pm   150 to 211.  Noted on e drop to  135 after going for a walk.   Says she has had an eye exam recently on [address]  [place]    Current antihyperglycemic regimen: glipizide 10 mg BID ? Hx metformin; notes that it caused significant diarrhea. Does not appear that she ever started metformin XR d/t cost, but refuses to try ? Is afraid of needles and is adamantly against injectable options ? Hx Rybelsus - GI upset w/ 7 mg dose ? Hx Jardiance - intolerance d/t itching, lack of energy, stomach problems   STILL SMOKING 10 CIGS DAILY .  LIVES ALONE ,  Only companion is her  SISTER'S DOG.  LONELY .  DICUSSED finding CREATIVE OUTLET  To occupy her time instead of smoking.  She likes flower arranging   Outpatient Medications Prior to Visit  Medication Sig Dispense Refill  . aspirin EC 81 MG tablet Take 1 tablet (81 mg total) by mouth daily. 30 tablet  11  . atorvastatin (LIPITOR) 20 MG tablet Take 1 tablet (20 mg total) by mouth daily. 90 tablet 3  . Blood Glucose Monitoring Suppl ([place]) w/Device KIT Use to check blood sugar up to TID. 1 kit 3  . Cholecalciferol (VITAMIN D) 50 MCG (2000 UT) CAPS Take 1,000 Units by mouth daily.     Marland Kitchen glipiZIDE (GLUCOTROL) 10 MG tablet Take 1 tablet (10 mg total) by mouth 2 (two) times daily before a meal. 180 tablet 1  . glucose blood test strip Use to test blood sugars up to three times daily 300 each 3  . Lancets (ONETOUCH ULTRASOFT) lancets Use as instructed to check sugars up to TID 300 each 3  . losartan-hydrochlorothiazide (HYZAAR) 100-25 MG tablet TAKE 1 TABLET DAILY 90 tablet 3  . pioglitazone (ACTOS) 15 MG tablet Take 1 tablet (15 mg total) by mouth daily. 90 tablet 1  . glipiZIDE (GLUCOTROL) 5 MG tablet Take 2 tablets (10 mg total) by mouth 2 (two) times daily before a meal. (Patient not taking: Reported on 04/12/2020) 120 tablet 0   No facility-administered medications prior to visit.    Review of Systems;  Patient denies headache, fevers, malaise, unintentional weight loss, skin rash, eye pain, sinus congestion and sinus pain, sore throat, dysphagia,  hemoptysis , cough, dyspnea, wheezing, chest pain,  palpitations, orthopnea, edema, abdominal pain, nausea, melena, diarrhea, constipation, flank pain, dysuria, hematuria, urinary  Frequency, nocturia, numbness, tingling, seizures,  Focal weakness, Loss of consciousness,  Tremor, insomnia, depression, anxiety, and suicidal ideation.      Objective:  BP 130/84 (BP Location: Left Arm, Patient Position: Sitting, Cuff Size: Normal)   Pulse 77   Temp 98.2 F (36.8 C) (Oral)   Resp 15   Ht '5\' 2"'  (1.575 m)   Wt 187 lb 9.6 oz (85.1 kg)   SpO2 98%   BMI 34.31 kg/m   BP Readings from Last 3 Encounters:  04/12/20 130/84  01/12/20 138/82  01/10/18 134/88    Wt Readings from Last 3 Encounters:  04/12/20 187 lb 9.6 oz (85.1  kg)  02/23/20 184 lb (83.5 kg)  01/12/20 191 lb 9.6 oz (86.9 kg)    General appearance: alert, cooperative and appears stated age Ears: normal TM's and external ear canals both ears Throat: lips, mucosa, and tongue normal; teeth and gums normal Neck: no adenopathy, no carotid bruit, supple, symmetrical, trachea midline and thyroid not enlarged, symmetric, no tenderness/mass/nodules Back: symmetric, no curvature. ROM normal. No CVA tenderness. Lungs: clear to auscultation bilaterally Heart: regular rate and rhythm, S1, S2 normal, no murmur, click, rub or gallop Abdomen: soft, non-tender; bowel sounds normal; no masses,  no organomegaly Pulses: 2+ and symmetric Skin: Skin color, texture, turgor normal. No rashes or lesions Lymph nodes: Cervical, supraclavicular, and axillary nodes normal.  Lab Results  Component Value Date   HGBA1C 9.6 (A) 04/12/2020   HGBA1C 9.7 (A) 03/17/2020   HGBA1C 11.8 (H) 12/11/2019    Lab Results  Component Value Date   CREATININE 0.68 03/24/2020   CREATININE 0.75 12/11/2019   CREATININE 0.63 04/28/2019    Lab Results  Component Value Date   WBC 5.2 04/01/2020   HGB 14.6 04/01/2020   HCT 42.5 04/01/2020   PLT 250.0 04/01/2020   GLUCOSE 224 (H) 03/24/2020   CHOL 165 12/11/2019   TRIG 57.0 12/11/2019   HDL 60.80 12/11/2019   LDLDIRECT 160.0 09/18/2017   LDLCALC 93 12/11/2019   ALT 44 (H) 03/24/2020   AST 32 03/24/2020   NA 137 03/24/2020   K 3.7 03/24/2020   CL 101 03/24/2020   CREATININE 0.68 03/24/2020   BUN 18 03/24/2020   CO2 30 03/24/2020   TSH 1.51 04/28/2019   HGBA1C 9.6 (A) 04/12/2020   MICROALBUR 3.6 (H) 03/17/2020    MM DIAG BREAST TOMO BILATERAL  Result Date: 01/08/2020 CLINICAL DATA:  Follow-up 2nd group of indeterminate calcifications in the upper-outer right breast. One group was biopsied under stereotactic guidance on 02/20/2018 with pathology results of fibroadenomatous changes with coarse calcification. EXAM: DIGITAL  DIAGNOSTIC BILATERAL MAMMOGRAM WITH TOMO AND CAD COMPARISON:  Previous exam(s). ACR Breast Density Category b: There are scattered areas of fibroglandular density. FINDINGS: The previously demonstrated small group of probably benign calcifications in the upper-outer right breast which were not biopsied have a more coarse morphology today. There are 2 similar-appearing groups in the left breast. No interval findings suspicious for malignancy in either breast. Mammographic images were processed with CAD. IMPRESSION: 1. Bilateral benign calcifications compatible with additional areas of fibroadenomatous changes. 2. No evidence of malignancy in either breast. RECOMMENDATION: Bilateral screening mammogram in 1 year. I have discussed the findings and recommendations with the patient. If applicable, a reminder letter will be sent to the patient regarding the next appointment. BI-RADS CATEGORY  2: Benign. Electronically Signed   By: Remo Lipps  Joneen Caraway M.D.   On: 01/08/2020 14:53    Assessment & Plan:   Problem List Items Addressed This Visit      Unprioritized   Uncontrolled type 2 diabetes mellitus ([place]) - Primary    Adding Januvia to current regimen of Actos and glipizide.  Encouraged her to start exercising daily as it resulted in lower blood sugars .  Reviewed the new insulin delivery systems that allow painfree administration and her  inevitable  need for insulin   Lab Results  Component Value Date   HGBA1C 9.6 (A) 04/12/2020         Relevant Medications   sitaGLIPtin (JANUVIA) 50 MG tablet   Other Relevant Orders   POCT HgB A1C (Completed)   Tobacco abuse counseling    Spent 3 minutes discussing risk of continued tobacco abuse, including but not limited to CAD, PAD, hypertension, and CA.  She is not interested in pharmacotherapy at this time.Smoking cessation instruction/counseling given: commended patient for reducing daily use. And encouraged  Patient to continue reduction in daily use by 1  cigarette every week      Essential hypertension    Well controlled on current regimen of losartan/hctz 100/25 . Renal function stable, no changes today.  Lab Results  Component Value Date   CREATININE 0.68 03/24/2020         Abnormal iron saturation    Secondary to overuse of iron.  Now resolved.        A total of 40 minutes was spent with patient more than half of which was spent in counseling patient on the above mentioned issues , reviewing and explaining recent labs and imaging studies done, and coordination of care.  I am having Katelyn Proctor start on sitaGLIPtin. I am also having her maintain her aspirin EC, Vitamin D, OneTouch Verio Flex System, glucose blood, onetouch ultrasoft, atorvastatin, glipiZIDE, pioglitazone, and losartan-hydrochlorothiazide.  Meds ordered this encounter  Medications  . sitaGLIPtin (JANUVIA) 50 MG tablet    Sig: Take 1 tablet (50 mg total) by mouth daily.    Dispense:  30 tablet    Refill:  0    Medications Discontinued During This Encounter  Medication Reason  . glipiZIDE (GLUCOTROL) 5 MG tablet     Follow-up: Return in about 3 months (around 07/11/2020) for follow up diabetes.   Crecencio Mc, MD

## 2020-04-12 NOTE — Patient Instructions (Addendum)
Try going for a walk EVERY DAY for 30 minutes.  It will lower your blood sugars and give your pancreas a break!  Continue glipizide 10 mg twice daily before your meals, and continue Actos.    Adding Januvia 50 MG DAILY  .  THIS CAN BE TAKE WITH ACTOS .

## 2020-04-12 NOTE — Assessment & Plan Note (Signed)
Secondary to overuse of iron.  Now resolved.

## 2020-04-12 NOTE — Assessment & Plan Note (Signed)
Well controlled on current regimen of losartan/hctz 100/25 . Renal function stable, no changes today.  Lab Results  Component Value Date   CREATININE 0.68 03/24/2020

## 2020-04-14 LAB — HEMOCHROMATOSIS DNA-PCR(C282Y,H63D)

## 2020-05-10 ENCOUNTER — Telehealth: Payer: Self-pay | Admitting: Internal Medicine

## 2020-05-10 MED ORDER — SITAGLIPTIN PHOSPHATE 50 MG PO TABS
50.0000 mg | ORAL_TABLET | Freq: Every day | ORAL | 1 refills | Status: DC
Start: 2020-05-10 — End: 2020-10-07

## 2020-05-10 NOTE — Telephone Encounter (Signed)
Patient is requesting a refill on her sitaGLIPtin (JANUVIA) 50 MG tablet

## 2020-05-18 ENCOUNTER — Ambulatory Visit: Payer: BC Managed Care – PPO | Admitting: Pharmacist

## 2020-05-18 DIAGNOSIS — E1165 Type 2 diabetes mellitus with hyperglycemia: Secondary | ICD-10-CM

## 2020-05-18 DIAGNOSIS — E7800 Pure hypercholesterolemia, unspecified: Secondary | ICD-10-CM

## 2020-05-18 DIAGNOSIS — I10 Essential (primary) hypertension: Secondary | ICD-10-CM

## 2020-05-18 NOTE — Chronic Care Management (AMB) (Signed)
Care Management   Pharmacy Note  05/18/2020 Name: Katelyn Proctor MRN: [id number] DOB: 05/21/1960  Katelyn Proctor is a 60 y.o. year old female who is a primary care patient of Crecencio Mc, MD. The Care Management/Care Coordination team team was consulted for assistance with care management and care coordination needs.    Engaged with patient by telephone for follow up visit in response to provider referral for pharmacy case management and/or care coordination services.   Consent to Services:  Patient was given information about care management/care coordination services, agreed to services, and gave verbal consent prior to initiation of services. Please see initial visit note for detailed documentation.   Review of patient status, including review of consultants reports, laboratory and other test data, was performed as part of comprehensive evaluation and provision of chronic care management services.   SDOH (Social Determinants of Health) assessments and interventions performed:    Objective:  Lab Results  Component Value Date   CREATININE 0.68 03/24/2020   CREATININE 0.75 12/11/2019   CREATININE 0.63 04/28/2019    Lab Results  Component Value Date   HGBA1C 9.6 (A) 04/12/2020       Component Value Date/Time   CHOL 165 12/11/2019 0947   TRIG 57.0 12/11/2019 0947   HDL 60.80 12/11/2019 0947   CHOLHDL 3 12/11/2019 0947   VLDL 11.4 12/11/2019 0947   LDLCALC 93 12/11/2019 0947   LDLDIRECT 160.0 09/18/2017 1413      Clinical ASCVD: No  The 10-year ASCVD risk score Mikey Bussing DC Jr., et al., 2013) is: 24.2%   Values used to calculate the score:     Age: 29 years     Sex: Female     Is Non-Hispanic African American: Yes     Diabetic: Yes     Tobacco smoker: Yes     Systolic Blood Pressure: 383 mmHg     Is BP treated: Yes     HDL Cholesterol: 60.8 mg/dL     Total Cholesterol: 165 mg/dL    BP Readings from Last 3 Encounters:  04/12/20 130/84  01/12/20 138/82  01/10/18  134/88    Care Plan  Allergies  Allergen Reactions  . Septra Ds [Sulfamethoxazole-Trimethoprim] Hives    Medications Reviewed Today    Reviewed by De Hollingshead, RPH-CPP (Pharmacist) on 05/18/20 at 1350  Med List Status: <None>  Medication Order Taking? Sig Documenting Provider Last Dose Status Informant  aspirin EC 81 MG tablet [id number] Yes Take 1 tablet (81 mg total) by mouth daily. Crecencio Mc, MD Taking Active   atorvastatin (LIPITOR) 20 MG tablet [id number] Yes Take 1 tablet (20 mg total) by mouth daily. Crecencio Mc, MD Taking Active   Blood Glucose Monitoring Suppl ([place]) w/Device KIT [id number] Yes Use to check blood sugar up to TID. Crecencio Mc, MD Taking Active   Cholecalciferol (VITAMIN D) 50 MCG (2000 UT) CAPS [id number] Yes Take 1,000 Units by mouth daily.  [provider] Taking Active         Discontinued 08/07/11 1328 (Error)   glipiZIDE (GLUCOTROL) 10 MG tablet [id number] Yes Take 1 tablet (10 mg total) by mouth 2 (two) times daily before a meal. Crecencio Mc, MD Taking Active   glucose blood test strip [id number] Yes Use to test blood sugars up to three times daily Crecencio Mc, MD Taking Active   Lancets ([place]) lancets [id number] Yes Use as instructed to check sugars up to TID [place],  Aris Everts, MD Taking Active   losartan-hydrochlorothiazide [facility]) 100-25 MG tablet [id number] Yes TAKE 1 TABLET DAILY Crecencio Mc, MD Taking Active   pioglitazone (ACTOS) 15 MG tablet [id number] Yes Take 1 tablet (15 mg total) by mouth daily. Crecencio Mc, MD Taking Active   sitaGLIPtin (JANUVIA) 50 MG tablet [id number] No Take 1 tablet (50 mg total) by mouth daily.  Patient not taking: Reported on 05/18/2020   Crecencio Mc, MD Not Taking Active           Patient Active Problem List   Diagnosis Date Noted  . Abnormal iron saturation 12/16/2019  . Breast calcifications on mammogram 01/22/2018  . Screening for colon  cancer 09/21/2017  . Herpes simplex antibody positive 02/01/2017  . Hidradenitis suppurativa 10/23/2016  . Complicated grief 27/78/2423  . Pure hypercholesterolemia 09/04/2016  . Encounter for preventive health examination 11/08/2014  . Pap smear for cervical cancer screening 11/05/2014  . Breast cancer screening 11/05/2014  . Statin intolerance 01/10/2014  . Obesity, morbid, BMI 40.0-49.9 ([place]) 02/09/2013  . Uncontrolled type 2 diabetes mellitus ([place])   . Obesity, diabetes, and hypertension syndrome ([place]) 08/01/2012  . Allergic rhinitis 10/21/2011  . Tobacco abuse counseling 04/01/2011  . Hyperlipidemia   . Essential hypertension   . GERD (gastroesophageal reflux disease)   . Tobacco abuse     Conditions to be addressed/monitored: HTN, HLD and DM  Patient Care Plan: Medication Management    Problem Identified: Diabetes, HTN, HLD     Long-Range Goal: Disease Progression Prevention   Start Date: 05/18/2020  This Visit's Progress: On track  Priority: High  Note:   Current Barriers:  . Unable to achieve control of diabetes   Pharmacist Clinical Goal(s):  Marland Kitchen Over the next 90 days, patient will achieve control of diabetes as evidenced by A1c  through collaboration with PharmD and provider.   Interventions: . 1:1 collaboration with Crecencio Mc, MD regarding development and update of comprehensive plan of care as evidenced by provider attestation and co-signature . Inter-disciplinary care team collaboration (see longitudinal plan of care) . Comprehensive medication review performed; medication list updated in electronic medical record  Diabetes: . Uncontrolled but improving; current treatment: Januvia 50 mg daily (has been out for ~1 week), pioglitazone 15 mg daily, glipizide 10 mg BID. Denies any LEE since start of pioglitazone o Hx metformin; notes that it caused significant diarrhea. Does not appear that she ever started metformin XR d/t cost, but refuses to try o Is afraid  of needles and is adamantly against injectable options o Hx Rybelsus - GI upset w/ 7 mg dose o Hx Jardiance - intolerance d/t itching, lack of energy, stomach problems . Current glucose readings: fasting glucose: , post prandial glucose: 80-110 . Denies hypoglycemic symptoms . Current exercise: continues to walk at least 30 minutes daily outside on good weather days, walking up to 3 [place] with her sister on weekends.  . Encouraged to contact [facility] to follow up on refill sent on 05/10/20. Encouraged to call us if any issues.  . Continue current regimen. Renal function appropriate for maximization of Januvia moving forward to 100 mg daily. Could also titrate pioglitazone, monitoring for weight gain, LEE. Discussed that glipizide could be reduced if development of hypoglycemia. Patient agrees to call us at clinic if consistent patterns of hypoglycemia  Hypertension: . Controlled per last clinic reading; current treatment:  . Current home readings: losartan/HCTZ 100/25 mg daily . Continue current regimen at this time  Hyperlipidemia: . Controlled; current treatment: atorvastatin 20 mg daily   . Current antiplatelet regimen: aspirin 81 mg daily . Recommended to continue current regimen at this time  Patient Goals/Self-Care Activities . Over the next 90 days, patient will:  - take medications as prescribed check glucose BID, document, and provide at future appointments  Follow Up Plan: Telephone follow up appointment with care management team member scheduled for: ~ 6 weeks     Medication Assistance: None required. Patient affirms current coverage meets needs.   Plan: Telephone follow up appointment with care management team member scheduled for: ~ 6 weeks  [name] Darnelle Maffucci, PharmD, [place], [place] Clinical Pharmacist [employer] at [employer] [phone]

## 2020-05-18 NOTE — Patient Instructions (Signed)
Visit Information  Patient Care Plan: Medication Management    Problem Identified: Diabetes, HTN, HLD     Long-Range Goal: Disease Progression Prevention   Start Date: 05/18/2020  This Visit's Progress: On track  Priority: High  Note:   Current Barriers:  . Unable to achieve control of diabetes   Pharmacist Clinical Goal(s):  Marland Kitchen Over the next 90 days, patient will achieve control of diabetes as evidenced by A1c  through collaboration with PharmD and provider.   Interventions: . 1:1 collaboration with Sherlene Shams, MD regarding development and update of comprehensive plan of care as evidenced by provider attestation and co-signature . Inter-disciplinary care team collaboration (see longitudinal plan of care) . Comprehensive medication review performed; medication list updated in electronic medical record  Diabetes: . Uncontrolled but improving; current treatment: Januvia 50 mg daily (has been out for ~1 week), pioglitazone 15 mg daily, glipizide 10 mg BID. Denies any LEE since start of pioglitazone o Hx metformin; notes that it caused significant diarrhea. Does not appear that she ever started metformin XR d/t cost, but refuses to try o Is afraid of needles and is adamantly against injectable options o Hx Rybelsus - GI upset w/ 7 mg dose o Hx Jardiance - intolerance d/t itching, lack of energy, stomach problems . Current glucose readings: fasting glucose: , post prandial glucose: 80-110 . Denies hypoglycemic symptoms . Current exercise: continues to walk at least 30 minutes daily outside on good weather days, walking up to 3 [place] with her sister on weekends.  . Encouraged to contact [facility] to follow up on refill sent on 05/10/20. Encouraged to call us if any issues.  . Continue current regimen. Renal function appropriate for maximization of Januvia moving forward to 100 mg daily. Could also titrate pioglitazone, monitoring for weight gain, LEE. Discussed that glipizide could  be reduced if development of hypoglycemia. Patient agrees to call us at clinic if consistent patterns of hypoglycemia  Hypertension: . Controlled per last clinic reading; current treatment:  . Current home readings: losartan/HCTZ 100/25 mg daily . Continue current regimen at this time  Hyperlipidemia: . Controlled; current treatment: atorvastatin 20 mg daily   . Current antiplatelet regimen: aspirin 81 mg daily . Recommended to continue current regimen at this time  Patient Goals/Self-Care Activities . Over the next 90 days, patient will:  - take medications as prescribed check glucose BID, document, and provide at future appointments  Follow Up Plan: Telephone follow up appointment with care management team member scheduled for: ~ 6 weeks     The patient verbalized understanding of instructions, educational materials, and care plan provided today and declined offer to receive copy of patient instructions, educational materials, and care plan.   Plan: Telephone follow up appointment with care management team member scheduled for: ~ 6 weeks  [name], PharmD, [place], CPP Clinical Pharmacist [employer] at [employer] [phone]

## 2020-06-30 ENCOUNTER — Ambulatory Visit: Payer: BC Managed Care – PPO | Admitting: Pharmacist

## 2020-06-30 DIAGNOSIS — I10 Essential (primary) hypertension: Secondary | ICD-10-CM

## 2020-06-30 DIAGNOSIS — E1165 Type 2 diabetes mellitus with hyperglycemia: Secondary | ICD-10-CM

## 2020-06-30 DIAGNOSIS — E7800 Pure hypercholesterolemia, unspecified: Secondary | ICD-10-CM

## 2020-06-30 NOTE — Patient Instructions (Signed)
Visit Information  Goals Addressed              This Visit's Progress     Patient Stated   .  Medication Monitoring (pt-stated)        Patient Goals/Self-Care Activities . Over the next 90 days, patient will:  - take medications as prescribed check glucose BID, document, and provide at future appointments       The patient verbalized understanding of instructions, educational materials, and care plan provided today and declined offer to receive copy of patient instructions, educational materials, and care plan.  Plan: Telephone follow up appointment with care management team member scheduled for:  ~ 12 weeks  [name], PharmD, [place], CPP Clinical Pharmacist [employer] at [employer] [phone]

## 2020-06-30 NOTE — Chronic Care Management (AMB) (Signed)
Care Management   Pharmacy Note  06/30/2020 Name: Katelyn Proctor MRN: [id number] DOB: 02/09/1961  Subjective: Katelyn Proctor is a 60 y.o. year old female who is a primary care patient of Crecencio Mc, MD. The Care Management team was consulted for assistance with care management and care coordination needs.    Engaged with patient by telephone for follow up visit in response to provider referral for pharmacy case management and/or care coordination services.   The patient was given information about Care Management services today including:  1. Care Management services includes personalized support from designated clinical staff supervised by the patient's primary care provider, including individualized plan of care and coordination with other care providers. 2. 24/7 contact phone numbers for assistance for urgent and routine care needs. 3. The patient may stop case management services at any time by phone call to the office staff.  Patient agreed to services and consent obtained.  Assessment:  Review of patient status, including review of consultants reports, laboratory and other test data, was performed as part of comprehensive evaluation and provision of chronic care management services.   SDOH (Social Determinants of Health) assessments and interventions performed:  SDOH Interventions   Flowsheet Row Most Recent Value  SDOH Interventions   Financial Strain Interventions Intervention Not Indicated  Physical Activity Interventions Intervention Not Indicated       Objective:  Lab Results  Component Value Date   CREATININE 0.68 03/24/2020   CREATININE 0.75 12/11/2019   CREATININE 0.63 04/28/2019    Lab Results  Component Value Date   HGBA1C 9.6 (A) 04/12/2020       Component Value Date/Time   CHOL 165 12/11/2019 0947   TRIG 57.0 12/11/2019 0947   HDL 60.80 12/11/2019 0947   CHOLHDL 3 12/11/2019 0947   VLDL 11.4 12/11/2019 0947   LDLCALC 93 12/11/2019 0947    LDLDIRECT 160.0 09/18/2017 1413    Clinical ASCVD: No  The 10-year ASCVD risk score Mikey Bussing DC Jr., et al., 2013) is: 24.2%   Values used to calculate the score:     Age: 44 years     Sex: Female     Is Non-Hispanic African American: Yes     Diabetic: Yes     Tobacco smoker: Yes     Systolic Blood Pressure: 341 mmHg     Is BP treated: Yes     HDL Cholesterol: 60.8 mg/dL     Total Cholesterol: 165 mg/dL     BP Readings from Last 3 Encounters:  04/12/20 130/84  01/12/20 138/82  01/10/18 134/88    Care Plan  Allergies  Allergen Reactions  . Septra Ds [Sulfamethoxazole-Trimethoprim] Hives    Medications Reviewed Today    Reviewed by De Hollingshead, RPH-CPP (Pharmacist) on 06/30/20 at 1412  Med List Status: <None>  Medication Order Taking? Sig Documenting Provider Last Dose Status Informant  aspirin EC 81 MG tablet [id number] Yes Take 1 tablet (81 mg total) by mouth daily. Crecencio Mc, MD Taking Active   atorvastatin (LIPITOR) 20 MG tablet [id number] Yes Take 1 tablet (20 mg total) by mouth daily. Crecencio Mc, MD Taking Active   Blood Glucose Monitoring Suppl ([place]) w/Device KIT [id number] Yes Use to check blood sugar up to TID. Crecencio Mc, MD Taking Active   Cholecalciferol (VITAMIN D) 50 MCG (2000 UT) CAPS [id number] Yes Take 1,000 Units by mouth daily.  [provider] Taking Active  Discontinued 08/07/11 1328 (Error)   glipiZIDE (GLUCOTROL) 10 MG tablet [id number] Yes Take 1 tablet (10 mg total) by mouth 2 (two) times daily before a meal. Crecencio Mc, MD Taking Active   glucose blood test strip [id number] Yes Use to test blood sugars up to three times daily Crecencio Mc, MD Taking Active   Lancets [facility] ULTRASOFT) lancets [id number] Yes Use as instructed to check sugars up to TID Crecencio Mc, MD Taking Active   losartan-hydrochlorothiazide [facility]) 100-25 MG tablet [id number] Yes TAKE 1 TABLET DAILY Crecencio Mc,  MD Taking Active   pioglitazone (ACTOS) 15 MG tablet [id number] Yes Take 1 tablet (15 mg total) by mouth daily. Crecencio Mc, MD Taking Active   sitaGLIPtin (JANUVIA) 50 MG tablet [id number] Yes Take 1 tablet (50 mg total) by mouth daily. Crecencio Mc, MD Taking Active           Patient Active Problem List   Diagnosis Date Noted  . Abnormal iron saturation 12/16/2019  . Breast calcifications on mammogram 01/22/2018  . Screening for colon cancer 09/21/2017  . Herpes simplex antibody positive 02/01/2017  . Hidradenitis suppurativa 10/23/2016  . Complicated grief 56/43/3295  . Pure hypercholesterolemia 09/04/2016  . Encounter for preventive health examination 11/08/2014  . Pap smear for cervical cancer screening 11/05/2014  . Breast cancer screening 11/05/2014  . Statin intolerance 01/10/2014  . Obesity, morbid, BMI 40.0-49.9 ([place]) 02/09/2013  . Uncontrolled type 2 diabetes mellitus ([place])   . Obesity, diabetes, and hypertension syndrome ([place]) 08/01/2012  . Allergic rhinitis 10/21/2011  . Tobacco abuse counseling 04/01/2011  . Hyperlipidemia   . Essential hypertension   . GERD (gastroesophageal reflux disease)   . Tobacco abuse     Conditions to be addressed/monitored: HTN, HLD and DMII  Care Plan : Medication Management  Updates made by De Hollingshead, RPH-CPP since 06/30/2020 12:00 AM    Problem: Diabetes, HTN, HLD     Long-Range Goal: Disease Progression Prevention   Start Date: 05/18/2020  This Visit's Progress: On track  Recent Progress: On track  Priority: High  Note:   Current Barriers:  . Unable to achieve control of diabetes   Pharmacist Clinical Goal(s):  Marland Kitchen Over the next 90 days, patient will achieve control of diabetes as evidenced by A1c  through collaboration with PharmD and provider.   Interventions: . 1:1 collaboration with Crecencio Mc, MD regarding development and update of comprehensive plan of care as evidenced by provider attestation and  co-signature . Inter-disciplinary care team collaboration (see longitudinal plan of care) . Comprehensive medication review performed; medication list updated in electronic medical record  Health Maintenance: . Reviewed vaccination hx. Patient received initial [place] vaccination series. Updated chart with this information.   Diabetes: . Uncontrolled but improving; current treatment: Januvia 50 mg daily, pioglitazone 15 mg daily, glipizide 10 mg BID o Hx metformin; notes that it caused significant diarrhea. Does not appear that she ever started metformin XR d/t cost, but refuses to try o Is afraid of needles and is adamantly against injectable options o Hx Rybelsus - GI upset w/ 7 mg dose o Hx Jardiance - intolerance d/t itching, lack of energy, stomach problems . Current glucose readings: fasting glucose: 110-160s, post prandial glucose: 60-110 . Denies hypoglycemic symptoms . Current exercise: continues to walk at least 30 minutes daily outside on good weather days, walking up to [address] [place] with her sister on weekends.  . Current meal patterns: incorporating salads, vegetables more often.  Trying to cut back on breads. Drinks: mostly water; Supper/evening snack: nuts, fruit to work; occasional zero sugar apple juice . Discussed post supper hypoglycemic episodes. Patient notes they are relatively infrequent, generally if she eats a no-carb supper. Advised that she could split glipizide and only take 1/2 tab (5 mg) w/ low carb suppers that she anticipates would result in hypoglycemia otherwise. Continue Januvia 50 mg daily, pioglitazone 15 mg daily, glipizide 10 mg BID otherwise.  . Discussed that if next A1c not at goal, can increase Januvia to max dose 100 mg daily.   Hypertension: . Controlled per last clinic reading; current treatment: losartan/HCTZ 100/25 mg daily . Current home readings: not checking. Plans to purchase home BP cuff.  . Educated on purchasing arm cuff instead of wrist cuff.  Advised to call insurance to see if they offer any programs to obtain BP cuff, otherwise, purchase at local pharmacy.  . Continue current regimen at this time  Hyperlipidemia: . Controlled per last lipid panel; current treatment: atorvastatin 20 mg daily   . Current antiplatelet regimen: aspirin 81 mg daily . Recommended to continue current regimen at this time  Patient Goals/Self-Care Activities . Over the next 90 days, patient will:  - take medications as prescribed check glucose BID, document, and provide at future appointments  Follow Up Plan: Telephone follow up appointment with care management team member scheduled for: ~ 12 weeks (PCP f/u in ~ 6 weeks)     Medication Assistance:  None required.  Patient affirms current coverage meets needs.  Follow Up:  Patient agrees to Care Plan and Follow-up.  Plan: Telephone follow up appointment with care management team member scheduled for:  ~ 12 weeks  [name] Darnelle Maffucci, PharmD, [place], [place] Clinical Pharmacist [employer] at [employer] [phone]

## 2020-08-10 ENCOUNTER — Ambulatory Visit: Payer: BC Managed Care – PPO | Admitting: Internal Medicine

## 2020-09-09 ENCOUNTER — Other Ambulatory Visit (HOSPITAL_COMMUNITY)
Admission: RE | Admit: 2020-09-09 | Discharge: 2020-09-09 | Disposition: A | Discharge status: Home / Self Care | Payer: BC Managed Care – PPO | Source: Ambulatory Visit | Attending: Internal Medicine | Admitting: Internal Medicine

## 2020-09-09 ENCOUNTER — Encounter: Payer: Self-pay | Admitting: Internal Medicine

## 2020-09-09 ENCOUNTER — Other Ambulatory Visit: Payer: Self-pay

## 2020-09-09 ENCOUNTER — Ambulatory Visit (INDEPENDENT_AMBULATORY_CARE_PROVIDER_SITE_OTHER): Payer: BC Managed Care – PPO | Admitting: Internal Medicine

## 2020-09-09 VITALS — BP 120/74 | HR 78 | Temp 96.9°F | Resp 15 | Ht 62.0 in | Wt 206.4 lb

## 2020-09-09 DIAGNOSIS — Z124 Encounter for screening for malignant neoplasm of cervix: Secondary | ICD-10-CM | POA: Diagnosis not present

## 2020-09-09 DIAGNOSIS — E1165 Type 2 diabetes mellitus with hyperglycemia: Secondary | ICD-10-CM

## 2020-09-09 DIAGNOSIS — Z1211 Encounter for screening for malignant neoplasm of colon: Secondary | ICD-10-CM | POA: Diagnosis not present

## 2020-09-09 DIAGNOSIS — E669 Obesity, unspecified: Secondary | ICD-10-CM

## 2020-09-09 DIAGNOSIS — L732 Hidradenitis suppurativa: Secondary | ICD-10-CM | POA: Diagnosis not present

## 2020-09-09 DIAGNOSIS — I152 Hypertension secondary to endocrine disorders: Secondary | ICD-10-CM

## 2020-09-09 DIAGNOSIS — Z716 Tobacco abuse counseling: Secondary | ICD-10-CM | POA: Diagnosis not present

## 2020-09-09 DIAGNOSIS — E1159 Type 2 diabetes mellitus with other circulatory complications: Secondary | ICD-10-CM

## 2020-09-09 DIAGNOSIS — E1169 Type 2 diabetes mellitus with other specified complication: Secondary | ICD-10-CM

## 2020-09-09 DIAGNOSIS — Z0000 Encounter for general adult medical examination without abnormal findings: Secondary | ICD-10-CM | POA: Diagnosis not present

## 2020-09-09 NOTE — Progress Notes (Signed)
Patient ID: Katelyn Proctor, female    DOB: [birth year]  Age: 60 y.o. MRN: [id number]  The patient is here for annual preventive  examination and management of other type 2 DM complicated by obesity,  hypertension and hyperliidemia    The risk factors are reflected in the social history.  The roster of all physicians providing medical care to patient - is listed in the Snapshot section of the chart.  Activities of daily living:  The patient is 100% independent in all ADLs: dressing, toileting, feeding as well as independent mobility  Home safety : The patient has smoke detectors in the home. They wear seatbelts.  There are no firearms at home. There is no violence in the home.   There is no risks for hepatitis, STDs or HIV. There is no   history of blood transfusion. They have no travel history to infectious disease endemic areas of the world.  The patient has seen their dentist in the last six month. They have seen their eye doctor in the last year. She denies hearing  difficulty with regard to whispered voices and some television programs.  They have deferred audiologic testing in the last year.  They do not  have excessive sun exposure. Discussed the need for sun protection: hats, long sleeves and use of sunscreen if there is significant sun exposure.   Diet: the importance of a healthy diet is discussed. They do have a healthy diet.  The benefits of regular aerobic exercise were discussed. She walks 4 times per week ,  30 minutes.   Depression screen: there are no signs or vegative symptoms of depression- irritability, change in appetite, anhedonia, sadness/tearfullness.  The following portions of the patient's history were reviewed and updated as appropriate: allergies, current medications, past family history, past medical history,  past surgical history, past social history  and problem list.  Visual acuity was not assessed per patient preference since she has regular follow up with her  ophthalmologist. Hearing and body mass index were assessed and reviewed.   During the course of the visit the patient was educated and counseled about appropriate screening and preventive services including : fall prevention , diabetes screening, nutrition counseling, colorectal cancer screening, and recommended immunizations.    CC: The primary encounter diagnosis was Cervical cancer screening. Diagnoses of Uncontrolled type 2 diabetes mellitus with hyperglycemia ([place]), Obesity, diabetes, and hypertension syndrome ([place]), Screening for colon cancer, Hidradenitis suppurativa, Tobacco abuse counseling, and Encounter for preventive health examination were also pertinent to this visit.  1) Type 2 DM:  Fasting sugars < 140,  Works 3rd shift  Has hypoglycemic episodes infrequently.  Walking daily after working 3rd shift (around 8 :00 am)  Gets home 9a m fasting sugars < 140  Naps until 1 pm  Sugars range from 85 to 160. Eats before work around 4 pm .  Eats fruit (apples, stawberries,  Green bananas,  Green pears ) & nuts during work shift.     Retiring next year at age 45, job is too strenuous, too much lifting. Considering a move closer to daughter (2 hours away) who wants her to move to [place] to be nearer her , but she [place] the [place].   History Katelyn Proctor has a past medical history of Abnormal iron saturation (12/16/2019), Anemia, Chronic sinusitis, Diabetes mellitus without complication ([place]), GERD (gastroesophageal reflux disease), Hyperlipidemia, Hypertension, and Tobacco abuse.   She has a past surgical history that includes Cesarean section; Tubal ligation (1995);  Nasal sinus surgery (2003); and Breast biopsy (Right, 02/20/2018).   Her family history includes Breast cancer (age of onset: 43) in her cousin; Cancer in her brother; Coronary artery disease in her father and mother; Coronary artery disease (age of onset: 25) in her sister; Diabetes in her sister; Heart disease in her father;  Hypertension in her mother; Stroke in her paternal uncle.She reports that she has been smoking cigarettes. She has been smoking about 0.50 packs per day. She has never used smokeless tobacco. She reports that she does not drink alcohol and does not use drugs.  Outpatient Medications Prior to Visit  Medication Sig Dispense Refill  . aspirin EC 81 MG tablet Take 1 tablet (81 mg total) by mouth daily. 30 tablet 11  . atorvastatin (LIPITOR) 20 MG tablet Take 1 tablet (20 mg total) by mouth daily. 90 tablet 3  . Blood Glucose Monitoring Suppl ([place]) w/Device KIT Use to check blood sugar up to TID. 1 kit 3  . Cholecalciferol (VITAMIN D) 50 MCG (2000 UT) CAPS Take 1,000 Units by mouth daily.     Marland Kitchen glipiZIDE (GLUCOTROL) 10 MG tablet Take 1 tablet (10 mg total) by mouth 2 (two) times daily before a meal. 180 tablet 1  . glucose blood test strip Use to test blood sugars up to three times daily 300 each 3  . Lancets (ONETOUCH ULTRASOFT) lancets Use as instructed to check sugars up to TID 300 each 3  . losartan-hydrochlorothiazide (HYZAAR) 100-25 MG tablet TAKE 1 TABLET DAILY 90 tablet 3  . pioglitazone (ACTOS) 15 MG tablet Take 1 tablet (15 mg total) by mouth daily. 90 tablet 1  . sitaGLIPtin (JANUVIA) 50 MG tablet Take 1 tablet (50 mg total) by mouth daily. 90 tablet 1   No facility-administered medications prior to visit.    Review of Systems   Patient denies headache, fevers, malaise, unintentional weight loss, skin rash, eye pain, sinus congestion and sinus pain, sore throat, dysphagia,  hemoptysis , cough, dyspnea, wheezing, chest pain, palpitations, orthopnea, edema, abdominal pain, nausea, melena, diarrhea, constipation, flank pain, dysuria, hematuria, urinary  Frequency, nocturia, numbness, tingling, seizures,  Focal weakness, Loss of consciousness,  Tremor, insomnia, depression, anxiety, and suicidal ideation.      Objective:  BP 120/74 (BP Location: Left Arm, Patient  Position: Sitting, Cuff Size: Large)   Pulse 78   Temp (!) 96.9 F (36.1 C) (Temporal)   Resp 15   Ht '5\' 2"'  (1.575 m)   Wt 206 lb 6.4 oz (93.6 kg)   SpO2 95%   BMI 37.75 kg/m   Physical Exam  General Appearance:    Alert, cooperative, no distress, appears stated age  Head:    Normocephalic, without obvious abnormality, atraumatic  Eyes:    PERRL, conjunctiva/corneas clear, EOM's intact, fundi    benign, both eyes  Ears:    Normal TM's and external ear canals, both ears  Nose:   Nares normal, septum midline, mucosa normal, no drainage    or sinus tenderness  Throat:   Lips, mucosa, and tongue normal; teeth and gums normal  Neck:   Supple, symmetrical, trachea midline, no adenopathy;    thyroid:  no enlargement/tenderness/nodules; no carotid   bruit or JVD  Back:     Symmetric, no curvature, ROM normal, no CVA tenderness  Lungs:     Clear to auscultation bilaterally, respirations unlabored  Chest Wall:    No tenderness or deformity   Heart:    Regular  rate and rhythm, S1 and S2 normal, no murmur, rub   or gallop  Breast Exam:    No tenderness, masses, or nipple abnormality  Abdomen:     Soft, non-tender, bowel sounds active all four quadrants,    no masses, no organomegaly  Genitalia:    Pelvic: cervix normal in appearance, external genitalia normal, no adnexal masses or tenderness, no cervical motion tenderness, rectovaginal septum normal, uterus normal size, shape, and consistency and vagina normal without discharge  Extremities:   Extremities normal, atraumatic, no cyanosis or edema  Pulses:   2+ and symmetric all extremities  Skin:   Skin color, texture, turgor normal, no rashes or lesions  Lymph nodes:   Cervical, supraclavicular, and axillary nodes normal  Neurologic:   CNII-XII intact, normal strength, sensation and reflexes    throughout     Assessment & Plan:   Problem List Items Addressed This Visit      Unprioritized   Uncontrolled type 2 diabetes mellitus  ([place])    At last visit Januvia was added to regimen of of Actos and glipizide.  She was encouraged her to start exercising daily as it resulted in lower blood sugars .  Reviewed the new insulin delivery systems that allow painfree administration and her inevitable  need for insulin . Repeat labs are pending   Lab Results  Component Value Date   HGBA1C 9.6 (A) 04/12/2020         Relevant Orders   Comprehensive metabolic panel   Hemoglobin A1c   Lipid panel   Tobacco abuse counseling    Spent 3 minutes discussing risk of continued tobacco abuse, including but not limited to CAD, PAD, hypertension, and CA.  She is not interested in pharmacotherapy at this time.Smoking cessation instruction/counseling given: commended patient for reducing daily use. And encouraged  Patient to continue reduction in daily use by 1 cigarette every week      Screening for colon cancer    Overdue for colon ca screening by patient choice due to fear of COVID.  Repeated the need for screening ; she is contemplative of the procedure and the risk of exposure.       Obesity, diabetes, and hypertension syndrome ([place])    I have encouraged her to reduce her  BMI with goal of 10% of body weigh over the next 6 months using a low glycemic index diet and regular exercise a minimum of 5 days per week.        Hidradenitis suppurativa    No recent recurrences      Encounter for preventive health examination    age appropriate education and counseling updated, referrals for preventative services and immunizations addressed, dietary and smoking counseling addressed, most recent labs reviewed.  I have personally reviewed and have noted:  1) the patient's medical and social history 2) The pt's use of alcohol, tobacco, and illicit drugs 3) The patient's current medications and supplements 4) Functional ability including ADL's, fall risk, home safety risk, hearing and visual impairment 5) Diet and physical activities 6)  Evidence for depression or mood disorder 7) The patient's height, weight, and BMI have been recorded in the chart  I have made referrals, and provided counseling and education based on review of the above       Other Visit Diagnoses    Cervical cancer screening    -  Primary   Relevant Orders   Cytology - PAP      I am having Prentiss D.  Seoane maintain her aspirin EC, Vitamin D, OneTouch Verio Flex System, glucose blood, onetouch ultrasoft, atorvastatin, glipiZIDE, pioglitazone, losartan-hydrochlorothiazide, and sitaGLIPtin.  No orders of the defined types were placed in this encounter.   There are no discontinued medications.  Follow-up: Return in about 3 months (around 12/09/2020) for follow up diabetes.   Crecencio Mc, MD

## 2020-09-09 NOTE — Patient Instructions (Addendum)
You are overdue for your colonscopy. Please let me know when you are ready to commit and I will make a referral  Health Maintenance for Postmenopausal Women Menopause is a normal process in which your ability to get pregnant comes to an end. This process happens slowly over many months or years, usually between the ages of 89 and 26. Menopause is complete when you have missed your menstrual periods for 12 months. It is important to talk with your health care provider about some of the most common conditions that affect women after menopause (postmenopausal women). These include heart disease, cancer, and bone loss (osteoporosis). Adopting a healthy lifestyle and getting preventive care can help to promote your health and wellness. The actions you take can also lower your chances of developing some of these common conditions. What should I know about menopause? During menopause, you may get a number of symptoms, such as:  Hot flashes. These can be moderate or severe.  Night sweats.  Decrease in sex drive.  Mood swings.  Headaches.  Tiredness.  Irritability.  Memory problems.  Insomnia. Choosing to treat or not to treat these symptoms is a decision that you make with your health care provider. Do I need hormone replacement therapy?  Hormone replacement therapy is effective in treating symptoms that are caused by menopause, such as hot flashes and night sweats.  Hormone replacement carries certain risks, especially as you become older. If you are thinking about using estrogen or estrogen with progestin, discuss the benefits and risks with your health care provider. What is my risk for heart disease and stroke? The risk of heart disease, heart attack, and stroke increases as you age. One of the causes may be a change in the body's hormones during menopause. This can affect how your body uses dietary fats, triglycerides, and cholesterol. Heart attack and stroke are medical emergencies.  There are many things that you can do to help prevent heart disease and stroke. Watch your blood pressure  High blood pressure causes heart disease and increases the risk of stroke. This is more likely to develop in people who have high blood pressure readings, are of African descent, or are overweight.  Have your blood pressure checked: ? Every 3-5 years if you are 29-67 years of age. ? Every year if you are 65 years old or older. Eat a healthy diet  Eat a diet that includes plenty of vegetables, fruits, low-fat dairy products, and lean protein.  Do not eat a lot of foods that are high in solid fats, added sugars, or sodium.   Get regular exercise Get regular exercise. This is one of the most important things you can do for your health. Most adults should:  Try to exercise for at least 150 minutes each week. The exercise should increase your heart rate and make you sweat (moderate-intensity exercise).  Try to do strengthening exercises at least twice each week. Do these in addition to the moderate-intensity exercise.  Spend less time sitting. Even light physical activity can be beneficial. Other tips  Work with your health care provider to achieve or maintain a healthy weight.  Do not use any products that contain nicotine or tobacco, such as cigarettes, e-cigarettes, and chewing tobacco. If you need help quitting, ask your health care provider.  Know your numbers. Ask your health care provider to check your cholesterol and your blood sugar (glucose). Continue to have your blood tested as directed by your health care provider. Do I need screening  for cancer? Depending on your health history and family history, you may need to have cancer screening at different stages of your life. This may include screening for:  Breast cancer.  Cervical cancer.  Lung cancer.  Colorectal cancer. What is my risk for osteoporosis? After menopause, you may be at increased risk for osteoporosis.  Osteoporosis is a condition in which bone destruction happens more quickly than new bone creation. To help prevent osteoporosis or the bone fractures that can happen because of osteoporosis, you may take the following actions:  If you are 30-1 years old, get at least 1,000 mg of calcium and at least 600 mg of vitamin D per day.  If you are older than age 6 but younger than age 46, get at least 1,200 mg of calcium and at least 600 mg of vitamin D per day.  If you are older than age 80, get at least 1,200 mg of calcium and at least 800 mg of vitamin D per day. Smoking and drinking excessive alcohol increase the risk of osteoporosis. Eat foods that are rich in calcium and vitamin D, and do weight-bearing exercises several times each week as directed by your health care provider. How does menopause affect my mental health? Depression may occur at any age, but it is more common as you become older. Common symptoms of depression include:  Low or sad mood.  Changes in sleep patterns.  Changes in appetite or eating patterns.  Feeling an overall lack of motivation or enjoyment of activities that you previously enjoyed.  Frequent crying spells. Talk with your health care provider if you think that you are experiencing depression. General instructions See your health care provider for regular wellness exams and vaccines. This may include:  Scheduling regular health, dental, and eye exams.  Getting and maintaining your vaccines. These include: ? Influenza vaccine. Get this vaccine each year before the flu season begins. ? Pneumonia vaccine. ? Shingles vaccine. ? Tetanus, diphtheria, and pertussis (Tdap) booster vaccine. Your health care provider may also recommend other immunizations. Tell your health care provider if you have ever been abused or do not feel safe at home. Summary  Menopause is a normal process in which your ability to get pregnant comes to an end.  This condition causes  hot flashes, night sweats, decreased interest in sex, mood swings, headaches, or lack of sleep.  Treatment for this condition may include hormone replacement therapy.  Take actions to keep yourself healthy, including exercising regularly, eating a healthy diet, watching your weight, and checking your blood pressure and blood sugar levels.  Get screened for cancer and depression. Make sure that you are up to date with all your vaccines. This information is not intended to replace advice given to you by your health care provider. Make sure you discuss any questions you have with your health care provider. Document Revised: 04/23/2018 Document Reviewed: 04/23/2018 Elsevier Patient Education  2021 [employer].

## 2020-09-11 ENCOUNTER — Encounter: Payer: Self-pay | Admitting: Internal Medicine

## 2020-09-11 NOTE — Assessment & Plan Note (Signed)
I have encouraged her to reduce her  BMI  with goal of 10% of body weigh over the next 6 months using a low glycemic index diet and regular exercise a minimum of 5 days per week.  ? ?

## 2020-09-11 NOTE — Assessment & Plan Note (Signed)
Spent 3 minutes discussing risk of continued tobacco abuse, including but not limited to CAD, PAD, hypertension, and CA.  She is not interested in pharmacotherapy at this time.Smoking cessation instruction/counseling given: commended patient for reducing daily use. And encouraged  Patient to continue reduction in daily use by 1 cigarette every week

## 2020-09-11 NOTE — Assessment & Plan Note (Signed)
No recent recurrences 

## 2020-09-11 NOTE — Assessment & Plan Note (Signed)

## 2020-09-11 NOTE — Assessment & Plan Note (Addendum)
Overdue for colon ca screening by patient choice due to fear of COVID.  Repeated the need for screening ; she is contemplative of the procedure and the risk of exposure.

## 2020-09-11 NOTE — Assessment & Plan Note (Signed)
At last visit Januvia was added to regimen of of Actos and glipizide.  She was encouraged her to start exercising daily as it resulted in lower blood sugars .  Reviewed the new insulin delivery systems that allow painfree administration and her inevitable  need for insulin . Repeat labs are pending   Lab Results  Component Value Date   HGBA1C 9.6 (A) 04/12/2020

## 2020-09-13 LAB — CYTOLOGY - PAP
Adequacy: ABSENT
Comment: NEGATIVE
Diagnosis: NEGATIVE
High risk HPV: NEGATIVE

## 2020-09-23 ENCOUNTER — Telehealth: Payer: BC Managed Care – PPO

## 2020-10-05 ENCOUNTER — Other Ambulatory Visit: Payer: Self-pay | Admitting: Internal Medicine

## 2020-10-05 DIAGNOSIS — E7800 Pure hypercholesterolemia, unspecified: Secondary | ICD-10-CM

## 2020-10-07 ENCOUNTER — Other Ambulatory Visit: Payer: Self-pay | Admitting: Internal Medicine

## 2020-10-07 ENCOUNTER — Ambulatory Visit: Payer: BC Managed Care – PPO | Admitting: Pharmacist

## 2020-10-07 DIAGNOSIS — E7800 Pure hypercholesterolemia, unspecified: Secondary | ICD-10-CM

## 2020-10-07 DIAGNOSIS — E1165 Type 2 diabetes mellitus with hyperglycemia: Secondary | ICD-10-CM

## 2020-10-07 DIAGNOSIS — I10 Essential (primary) hypertension: Secondary | ICD-10-CM

## 2020-10-07 MED ORDER — PIOGLITAZONE HCL 15 MG PO TABS: 15.0000 mg | ORAL_TABLET | Freq: Every day | ORAL | 0 refills | Status: DC

## 2020-10-07 MED ORDER — SITAGLIPTIN PHOSPHATE 50 MG PO TABS: 50.0000 mg | ORAL_TABLET | Freq: Every day | ORAL | 1 refills | Status: DC

## 2020-10-07 NOTE — Chronic Care Management (AMB) (Signed)
Care Management   Pharmacy Note  10/07/2020 Name: Katelyn Proctor MRN: [id number] DOB: [birth year]  Subjective: Katelyn Proctor is a 60 y.o. year old female who is a primary care patient of Crecencio Mc, MD. The Care Management team was consulted for assistance with care management and care coordination needs.    Engaged with patient by telephone for follow up visit in response to provider referral for pharmacy case management and/or care coordination services.   The patient was given information about Care Management services today including:  1. Care Management services includes personalized support from designated clinical staff supervised by the patient's primary care provider, including individualized plan of care and coordination with other care providers. 2. 24/7 contact phone numbers for assistance for urgent and routine care needs. 3. The patient may stop case management services at any time by phone call to the office staff.  Patient agreed to services and consent obtained.  Assessment:  Review of patient status, including review of consultants reports, laboratory and other test data, was performed as part of comprehensive evaluation and provision of chronic care management services.   SDOH (Social Determinants of Health) assessments and interventions performed:    Objective:  Lab Results  Component Value Date   CREATININE 0.68 03/24/2020   CREATININE 0.75 12/11/2019   CREATININE 0.63 04/28/2019    Lab Results  Component Value Date   HGBA1C 9.6 (A) 04/12/2020       Component Value Date/Time   CHOL 165 12/11/2019 0947   TRIG 57.0 12/11/2019 0947   HDL 60.80 12/11/2019 0947   CHOLHDL 3 12/11/2019 0947   VLDL 11.4 12/11/2019 0947   LDLCALC 93 12/11/2019 0947   LDLDIRECT 160.0 09/18/2017 1413    Clinical ASCVD: No  The 10-year ASCVD risk score Mikey Bussing DC Jr., et al., 2013) is: 21.1%   Values used to calculate the score:     Age: 32 years     Sex: Female     Is  Non-Hispanic African American: Yes     Diabetic: Yes     Tobacco smoker: Yes     Systolic Blood Pressure: 924 mmHg     Is BP treated: Yes     HDL Cholesterol: 60.8 mg/dL     Total Cholesterol: 165 mg/dL    BP Readings from Last 3 Encounters:  09/09/20 120/74  04/12/20 130/84  01/12/20 138/82    Care Plan  Allergies  Allergen Reactions  . Septra Ds [Sulfamethoxazole-Trimethoprim] Hives    Medications Reviewed Today    Reviewed by De Hollingshead, RPH-CPP (Pharmacist) on 10/07/20 at 1410  Med List Status: <None>  Medication Order Taking? Sig Documenting Provider Last Dose Status Informant  aspirin EC 81 MG tablet [id number] Yes Take 1 tablet (81 mg total) by mouth daily. Crecencio Mc, MD Taking Active   atorvastatin (LIPITOR) 20 MG tablet [id number] Yes TAKE 1 TABLET DAILY Crecencio Mc, MD Taking Active   Blood Glucose Monitoring Suppl (ONETOUCH VERIO FLEX SYSTEM) w/Device KIT [id number]  Use to check blood sugar up to TID. Crecencio Mc, MD  Active   Cholecalciferol (VITAMIN D) 50 MCG (2000 UT) CAPS [id number] Yes Take 1,000 Units by mouth daily.  [provider] Taking Active         Discontinued 08/07/11 1328 (Error)   glipiZIDE (GLUCOTROL) 10 MG tablet [id number] Yes Take 1 tablet (10 mg total) by mouth 2 (two) times daily before a meal. Crecencio Mc, MD Taking Active  glucose blood test strip [id number]  Use to test blood sugars up to three times daily Crecencio Mc, MD  Active   Lancets [facility] ULTRASOFT) lancets [id number] Yes Use as instructed to check sugars up to TID Crecencio Mc, MD Taking Active   losartan-hydrochlorothiazide [facility]) 100-25 MG tablet [id number] Yes TAKE 1 TABLET DAILY Crecencio Mc, MD Taking Active   pioglitazone (ACTOS) 15 MG tablet [id number]  Take 1 tablet (15 mg total) by mouth daily. Crecencio Mc, MD  Active   sitaGLIPtin (JANUVIA) 50 MG tablet [id number] Yes Take 1 tablet (50 mg total) by mouth daily. Crecencio Mc, MD  Taking Active           Patient Active Problem List   Diagnosis Date Noted  . Breast calcifications on mammogram 01/22/2018  . Screening for colon cancer 09/21/2017  . Herpes simplex antibody positive 02/01/2017  . Hidradenitis suppurativa 10/23/2016  . Complicated grief 42/87/6811  . Pure hypercholesterolemia 09/04/2016  . Encounter for preventive health examination 11/08/2014  . Pap smear for cervical cancer screening 11/05/2014  . Breast cancer screening 11/05/2014  . Statin intolerance 01/10/2014  . Obesity, morbid, BMI 40.0-49.9 ([place]) 02/09/2013  . Uncontrolled type 2 diabetes mellitus ([place])   . Obesity, diabetes, and hypertension syndrome ([place]) 08/01/2012  . Allergic rhinitis 10/21/2011  . Tobacco abuse counseling 04/01/2011  . Hyperlipidemia   . Essential hypertension   . GERD (gastroesophageal reflux disease)   . Tobacco abuse     Conditions to be addressed/monitored: HTN, HLD and DMII  Care Plan : Medication Management  Updates made by De Hollingshead, RPH-CPP since 10/07/2020 12:00 AM    Problem: Diabetes, HTN, HLD     Long-Range Goal: Disease Progression Prevention   Start Date: 05/18/2020  This Visit's Progress: On track  Recent Progress: On track  Priority: High  Note:   Current Barriers:  . Unable to achieve control of diabetes   Pharmacist Clinical Goal(s):  Marland Kitchen Over the next 90 days, patient will achieve control of diabetes as evidenced by A1c  through collaboration with PharmD and provider.   Interventions: . 1:1 collaboration with Crecencio Mc, MD regarding development and update of comprehensive plan of care as evidenced by provider attestation and co-signature . Inter-disciplinary care team collaboration (see longitudinal plan of care) . Comprehensive medication review performed; medication list updated in electronic medical record   Diabetes: . Uncontrolled but improving; current treatment: Januvia 50 mg daily, pioglitazone 15 mg daily,  glipizide 10 mg BID . Reports needing a refill on pioglitazone. Should be shipping from mail order, but she will run out tonight. Sent 30 day supply to local pharmacy to fill this weekend if mail order doesn't arrive. Last fill 05/24/20 . Fill history: - glipizide 08/02/20, Januvia 07/20/20 - sending refill today to mail order o Hx metformin; notes that it caused significant diarrhea. Does not appear that she ever started metformin XR d/t cost, but refuses to try o Is afraid of needles and is adamantly against injectable options o Hx Rybelsus - GI upset w/ 7 mg dose o Hx Jardiance - intolerance d/t itching, lack of energy, stomach problems . Current glucose readings: reports fasting sugars 80-110, but that she is "afraid" of sugars in the 80s. Reports that she does feel shaky in the 53s . Current exercise: continues to walk at least 30 minutes daily outside on good weather days, walking up to [address] [place] with her sister on weekends.  . Discussed reducing supper  glipizide dose to 5 mg to reduce risk of fasting lows. Patient declines to make any changes until her next appointment with PCP after A1c. Advised to call me with any episodes of glucose <70 or if she change she changes her mind and wants to make medication changes.  . Renal function appropriate for full dose Januvia 100 mg daily. Recommend increasing if next A1c not at goal  Hypertension: . Controlled per last clinic reading; current treatment: losartan/HCTZ 100/25 mg daily . Fill hx up to date: last fill 09/19/20 . Current home readings: declined to discuss today.  . Previously educated on purchasing arm cuff instead of wrist cuff and check BP readings at home.  . Recommend to continue current regimen at this time  Hyperlipidemia: . Controlled per last lipid panel; current treatment: atorvastatin 20 mg daily   . Current antiplatelet regimen: aspirin 81 mg daily . Recommended to continue current regimen at this time  Patient Goals/Self-Care  Activities . Over the next 90 days, patient will:  - take medications as prescribed check glucose BID, document, and provide at future appointments  Follow Up Plan: Telephone follow up appointment with care management team member scheduled for: ~ 12 weeks (PCP f/u in ~ 8 weeks)     Medication Assistance:  None required.  Patient affirms current coverage meets needs.  Follow Up:  Patient agrees to Care Plan and Follow-up.  Plan: Telephone follow up appointment with care management team member scheduled for:  ~ 12 weeks  [name] Darnelle Maffucci, PharmD, [place], [place] Clinical Pharmacist [employer] at [employer] [phone]

## 2020-10-07 NOTE — Patient Instructions (Signed)
Visit Information  PATIENT GOALS: Goals Addressed              This Visit's Progress     Patient Stated   .  Medication Monitoring (pt-stated)        Patient Goals/Self-Care Activities . Over the next 90 days, patient will:  - take medications as prescribed check glucose at least twice daily, document, and provide at future appointments       Patient verbalizes understanding of instructions provided today and agrees to view in MyChart.   Plan: Telephone follow up appointment with care management team member scheduled for:  ~ 12 weeks  [name], PharmD, [place], CPP Clinical Pharmacist [employer] at [employer] [phone]

## 2020-10-11 ENCOUNTER — Telehealth: Payer: Self-pay | Admitting: Internal Medicine

## 2020-10-11 MED ORDER — GLIPIZIDE 10 MG PO TABS: 10.0000 mg | ORAL_TABLET | Freq: Two times a day (BID) | ORAL | 1 refills | Status: DC

## 2020-10-11 NOTE — Telephone Encounter (Signed)
Patient needs the following medication refilled, glipiZIDE (GLUCOTROL) 10 MG tablet. Please send to mail order Pharmacy.

## 2020-11-11 ENCOUNTER — Other Ambulatory Visit: Payer: Self-pay | Admitting: Internal Medicine

## 2020-11-11 DIAGNOSIS — E1165 Type 2 diabetes mellitus with hyperglycemia: Secondary | ICD-10-CM

## 2020-12-01 LAB — HM DIABETES EYE EXAM

## 2020-12-08 ENCOUNTER — Other Ambulatory Visit: Payer: Self-pay

## 2020-12-08 ENCOUNTER — Other Ambulatory Visit (INDEPENDENT_AMBULATORY_CARE_PROVIDER_SITE_OTHER): Payer: BC Managed Care – PPO

## 2020-12-08 DIAGNOSIS — E1165 Type 2 diabetes mellitus with hyperglycemia: Secondary | ICD-10-CM

## 2020-12-08 LAB — COMPREHENSIVE METABOLIC PANEL
ALT: 51 U/L — ABNORMAL HIGH (ref 0–35)
AST: 37 U/L (ref 0–37)
Albumin: 3.9 g/dL (ref 3.5–5.2)
Alkaline Phosphatase: 97 U/L (ref 39–117)
BUN: 12 mg/dL (ref 6–23)
CO2: 28 mEq/L (ref 19–32)
Calcium: 9.5 mg/dL (ref 8.4–10.5)
Chloride: 103 mEq/L (ref 96–112)
Creatinine, Ser: 0.69 mg/dL (ref 0.40–1.20)
GFR: 93.72 mL/min (ref >60.00)
Glucose, Bld: 134 mg/dL — ABNORMAL HIGH (ref 70–99)
Potassium: 4.1 mEq/L (ref 3.5–5.1)
Sodium: 139 mEq/L (ref 135–145)
Total Bilirubin: 0.8 mg/dL (ref 0.2–1.2)
Total Protein: 6.8 g/dL (ref 6.0–8.3)

## 2020-12-08 LAB — LIPID PANEL
Cholesterol: 146 mg/dL (ref 0–200)
HDL: 53.4 mg/dL (ref >39.00)
LDL Cholesterol: 83 mg/dL (ref 0–99)
NonHDL: 92.4
Total CHOL/HDL Ratio: 3
Triglycerides: 49 mg/dL (ref 0.0–149.0)
VLDL: 9.8 mg/dL (ref 0.0–40.0)

## 2020-12-08 LAB — HEMOGLOBIN A1C: Hgb A1c MFr Bld: 7.1 % — ABNORMAL HIGH (ref 4.6–6.5)

## 2020-12-12 ENCOUNTER — Ambulatory Visit (INDEPENDENT_AMBULATORY_CARE_PROVIDER_SITE_OTHER): Payer: BC Managed Care – PPO | Admitting: Internal Medicine

## 2020-12-12 ENCOUNTER — Encounter: Payer: Self-pay | Admitting: Internal Medicine

## 2020-12-12 ENCOUNTER — Other Ambulatory Visit: Payer: Self-pay

## 2020-12-12 VITALS — BP 132/74 | HR 83 | Temp 96.0°F | Ht 62.0 in | Wt 206.8 lb

## 2020-12-12 DIAGNOSIS — E1169 Type 2 diabetes mellitus with other specified complication: Secondary | ICD-10-CM

## 2020-12-12 DIAGNOSIS — L0232 Furuncle of buttock: Secondary | ICD-10-CM | POA: Diagnosis not present

## 2020-12-12 DIAGNOSIS — E1165 Type 2 diabetes mellitus with hyperglycemia: Secondary | ICD-10-CM | POA: Diagnosis not present

## 2020-12-12 DIAGNOSIS — E669 Obesity, unspecified: Secondary | ICD-10-CM

## 2020-12-12 DIAGNOSIS — L84 Corns and callosities: Secondary | ICD-10-CM

## 2020-12-12 DIAGNOSIS — E1159 Type 2 diabetes mellitus with other circulatory complications: Secondary | ICD-10-CM

## 2020-12-12 DIAGNOSIS — I152 Hypertension secondary to endocrine disorders: Secondary | ICD-10-CM

## 2020-12-12 MED ORDER — DOXYCYCLINE HYCLATE 100 MG PO TABS: 100.0000 mg | ORAL_TABLET | Freq: Two times a day (BID) | ORAL | 0 refills | Status: DC

## 2020-12-12 NOTE — Progress Notes (Signed)
[name]  Subjective:  Patient ID: Katelyn Proctor, female    DOB: 1960/12/11  Age: 60 y.o. MRN: [id number]  CC: The primary encounter diagnosis was Corn of foot. Diagnoses of Boil of buttock, Uncontrolled type 2 diabetes mellitus with hyperglycemia ([place]), and Obesity, diabetes, and hypertension syndrome ([place]) were also pertinent to this visit.  HPI Katelyn Proctor presents for  follow up on type 2 DM  This visit occurred during the SARS-CoV-2 public health emergency.  Safety protocols were in place, including screening questions prior to the visit, additional usage of staff PPE, and extensive cleaning of exam room while observing appropriate contact time as indicated for disinfecting soltions.   3 month follow up on Type 2 DM, Hypertension, hyperlipidemia and obesity.  She has had no  weight loss  since last visit  but has been reducing the sugar in her diet.    checks her sugars once daily in the morning, usually.   cbgs have been consistently under 130. Not walking ucurrntly due to to  persistent right foot pain  on the lateral palmasr side due   to a painful corn,  has been soaking it daily with epsom salts   taking januvia and actos.    Was walking 3 [place] daily   2) painful bleeding furuncle on left buttock  near the natal cleft.  Intermittently becomes painful drains pus but mostly pus,.  Indurated  , has been present for over 3 months          Outpatient Medications Prior to Visit  Medication Sig Dispense Refill   aspirin EC 81 MG tablet Take 1 tablet (81 mg total) by mouth daily. 30 tablet 11   atorvastatin (LIPITOR) 20 MG tablet TAKE 1 TABLET DAILY 90 tablet 3   Blood Glucose Monitoring Suppl (ONETOUCH VERIO FLEX SYSTEM) w/Device KIT Use to check blood sugar up to TID. 1 kit 3   Cholecalciferol (VITAMIN D) 50 MCG (2000 UT) CAPS Take 1,000 Units by mouth daily.      glipiZIDE (GLUCOTROL) 10 MG tablet Take 1 tablet (10 mg total) by mouth 2 (two) times daily before a meal. 180 tablet  1   Lancets (ONETOUCH ULTRASOFT) lancets Use as instructed to check sugars up to TID 300 each 3   losartan-hydrochlorothiazide (HYZAAR) 100-25 MG tablet TAKE 1 TABLET DAILY 90 tablet 3   ONETOUCH VERIO test strip USE TO TEST BLOOD SUGARS TWICE DAILY 200 strip 1   ONETOUCH VERIO test strip USE TO TEST BLOOD SUGARS TWICE DAILY 200 strip 1   pioglitazone (ACTOS) 15 MG tablet TAKE 1 TABLET DAILY 90 tablet 3   sitaGLIPtin (JANUVIA) 50 MG tablet Take 1 tablet (50 mg total) by mouth daily. 90 tablet 1   No facility-administered medications prior to visit.    Review of Systems;  Patient denies headache, fevers, malaise, unintentional weight loss, skin rash, eye pain, sinus congestion and sinus pain, sore throat, dysphagia,  hemoptysis , cough, dyspnea, wheezing, chest pain, palpitations, orthopnea, edema, abdominal pain, nausea, melena, diarrhea, constipation, flank pain, dysuria, hematuria, urinary  Frequency, nocturia, numbness, tingling, seizures,  Focal weakness, Loss of consciousness,  Tremor, insomnia, depression, anxiety, and suicidal ideation.      Objective:  BP 132/74 (BP Location: Left Arm, Patient Position: Sitting, Cuff Size: Large)   Pulse 83   Temp (!) 96 F (35.6 C) (Temporal)   Ht _0  (1.575 m)   Wt 206 lb 12.8 oz (93.8 kg)   SpO2 98%  BMI 37.82 kg/m   BP Readings from Last 3 Encounters:  12/12/20 132/74  09/09/20 120/74  04/12/20 130/84    Wt Readings from Last 3 Encounters:  12/12/20 206 lb 12.8 oz (93.8 kg)  09/09/20 206 lb 6.4 oz (93.6 kg)  04/12/20 187 lb 9.6 oz (85.1 kg)    General appearance: alert, cooperative and appears stated age Ears: normal TM's and external ear canals both ears Throat: lips, mucosa, and tongue normal; teeth and gums normal Neck: no adenopathy, no carotid bruit, supple, symmetrical, trachea midline and thyroid not enlarged, symmetric, no tenderness/mass/nodules Back: symmetric, no curvature. ROM normal. No CVA tenderness. Lungs:  clear to auscultation bilaterally Heart: regular rate and rhythm, S1, S2 normal, no murmur, click, rub or gallop Abdomen: soft, non-tender; bowel sounds normal; no masses,  no organomegaly Pulses: 2+ and symmetric Skin: small umbilicated furuncle o left buttock at left natal cleft with surrounding induration.  Ext:  lateral palmar surface of right foot at 5th MT head with corn .  Lymph nodes: Cervical, supraclavicular, and axillary nodes normal.  Lab Results  Component Value Date   HGBA1C 7.1 (H) 12/08/2020   HGBA1C 9.6 (A) 04/12/2020   HGBA1C 9.7 (A) 03/17/2020    Lab Results  Component Value Date   CREATININE 0.69 12/08/2020   CREATININE 0.68 03/24/2020   CREATININE 0.75 12/11/2019    Lab Results  Component Value Date   WBC 5.2 04/01/2020   HGB 14.6 04/01/2020   HCT 42.5 04/01/2020   PLT 250.0 04/01/2020   GLUCOSE 134 (H) 12/08/2020   CHOL 146 12/08/2020   TRIG 49.0 12/08/2020   HDL 53.40 12/08/2020   LDLDIRECT 160.0 09/18/2017   LDLCALC 83 12/08/2020   ALT 51 (H) 12/08/2020   AST 37 12/08/2020   NA 139 12/08/2020   K 4.1 12/08/2020   CL 103 12/08/2020   CREATININE 0.69 12/08/2020   BUN 12 12/08/2020   CO2 28 12/08/2020   TSH 1.51 04/28/2019   HGBA1C 7.1 (H) 12/08/2020   MICROALBUR 3.6 (H) 03/17/2020    No results found.  Assessment & Plan:   Problem List Items Addressed This Visit       Unprioritized   Obesity, diabetes, and hypertension syndrome ([place])    Improved control with dietary changes and exercise.  Continue current regiemn of Actos and Januvia. . Continue statin and ARB.  She has needle phobia so Ozempic trial deferred.    Foot exam normal excpet for painful corn; podiatry referral in process  Lab Results  Component Value Date   HGBA1C 7.1 (H) 12/08/2020   Lab Results  Component Value Date   MICROALBUR 3.6 (H) 03/17/2020   MICROALBUR <0.7 04/28/2019           Boil of buttock    Needs I & D.  Gen Surg referral made .  Doxycycline  prescribed for MRSA coverage (empiric)       Relevant Orders   Ambulatory referral to General Surgery   RESOLVED: Uncontrolled type 2 diabetes mellitus ([place])   Relevant Orders   Hemoglobin A1c   Comprehensive metabolic panel   Other Visit Diagnoses     Corn of foot    -  Primary   Relevant Orders   Ambulatory referral to Podiatry       I am having Katelyn Proctor start on doxycycline. I am also having her maintain her aspirin EC, Vitamin D, OneTouch Verio Flex System, onetouch ultrasoft, losartan-hydrochlorothiazide, atorvastatin, sitaGLIPtin, pioglitazone, glipiZIDE, OneTouch Verio, and  OneTouch Verio.  Meds ordered this encounter  Medications   doxycycline (VIBRA-TABS) 100 MG tablet    Sig: Take 1 tablet (100 mg total) by mouth 2 (two) times daily. WITH A FULL MEAL    Dispense:  14 tablet    Refill:  0    There are no discontinued medications.  Follow-up: Return in about 3 months (around 03/14/2021) for follow up diabetes.   Crecencio Mc, MD

## 2020-12-12 NOTE — Patient Instructions (Signed)
You go girl!!!!   Excellent work on the diabetes!   If you get your a1c < 6.5 you do not have to see me more than every 6 months  Referrals to [facility] and [facility] to have your corn and your boil taken care of  Doxycycline 100 mg twice daily with food for the infection

## 2020-12-13 DIAGNOSIS — L0232 Furuncle of buttock: Secondary | ICD-10-CM | POA: Insufficient documentation

## 2020-12-13 NOTE — Assessment & Plan Note (Addendum)
Improved control with dietary changes and exercise.  Continue current regiemn of Actos and Januvia. . Continue statin and ARB.  She has needle phobia so Ozempic trial deferred.    Foot exam normal excpet for painful corn; podiatry referral in process  Lab Results  Component Value Date   HGBA1C 7.1 (H) 12/08/2020   Lab Results  Component Value Date   MICROALBUR 3.6 (H) 03/17/2020   MICROALBUR <0.7 04/28/2019

## 2020-12-13 NOTE — Assessment & Plan Note (Signed)
Needs I & D.  Gen Surg referral made .  Doxycycline prescribed for MRSA coverage (empiric)

## 2020-12-20 ENCOUNTER — Ambulatory Visit: Payer: Self-pay | Admitting: Surgery

## 2020-12-23 ENCOUNTER — Ambulatory Visit (INDEPENDENT_AMBULATORY_CARE_PROVIDER_SITE_OTHER): Payer: BC Managed Care – PPO | Admitting: Podiatry

## 2020-12-23 ENCOUNTER — Other Ambulatory Visit: Payer: Self-pay

## 2020-12-23 DIAGNOSIS — B070 Plantar wart: Secondary | ICD-10-CM

## 2020-12-23 NOTE — Progress Notes (Addendum)
   Subjective: 60 y.o. female presenting today presenting for a symptomatic skin lesion to the right foot that has been present for the past month or two. She denies a history of injury. She presents for further treatment and evaluation   Past Medical History:  Diagnosis Date   Abnormal iron saturation 12/16/2019   Anemia    Chronic sinusitis    Diabetes mellitus without complication (HCC)    GERD (gastroesophageal reflux disease)    Hyperlipidemia    Hypertension    Tobacco abuse     Objective: Physical Exam General: The patient is alert and oriented x3 in no acute distress.   Dermatology: Hyperkeratotic skin lesion(s) noted to the plantar aspect of the right foot approximately 1 cm in diameter. Pinpoint bleeding noted upon debridement. Skin is warm, dry and supple bilateral lower extremities. Negative for open lesions or macerations.   Vascular: Palpable pedal pulses bilaterally. No edema or erythema noted. Capillary refill within normal limits.   Neurological: Epicritic and protective threshold grossly intact bilaterally.    Musculoskeletal Exam: Pain on palpation to the noted skin lesion(s).  Range of motion within normal limits to all pedal and ankle joints bilateral. Muscle strength 5/5 in all groups bilateral.    Assessment: #1 plantar wart right foot #2 pain in right foot     Plan of Care:  #1 Patient was evaluated. #2 Excisional debridement of the plantar wart lesion(s) was performed using a chisel blade. Salicylic acid was applied and the lesion(s) was dressed with a dry sterile dressing. #3 Salicylic acid provided to apply dialy.  #4 patient is to return to clinic in 2 weeks.     Felecia Shelling, DPM [facility]  Dr. Felecia Shelling, DPM    [address] [place], [place] [address]                Office [phone]  Fax [phone]

## 2020-12-29 ENCOUNTER — Other Ambulatory Visit: Payer: Self-pay | Admitting: Internal Medicine

## 2020-12-29 DIAGNOSIS — E1165 Type 2 diabetes mellitus with hyperglycemia: Secondary | ICD-10-CM

## 2021-01-05 ENCOUNTER — Other Ambulatory Visit: Payer: Self-pay

## 2021-01-05 ENCOUNTER — Encounter: Payer: Self-pay | Admitting: Surgery

## 2021-01-05 ENCOUNTER — Ambulatory Visit (INDEPENDENT_AMBULATORY_CARE_PROVIDER_SITE_OTHER): Payer: BC Managed Care – PPO | Admitting: Surgery

## 2021-01-05 VITALS — BP 145/91 | HR 80 | Temp 98.4°F | Ht 62.0 in | Wt 208.8 lb

## 2021-01-05 DIAGNOSIS — L0232 Furuncle of buttock: Secondary | ICD-10-CM | POA: Diagnosis not present

## 2021-01-05 NOTE — Progress Notes (Signed)
Patient ID: Katelyn Proctor, female   DOB: 12-04-1960, 60 y.o.   MRN: [id number]  Chief Complaint:  Left buttock boil  History of Present Illness Katelyn Proctor is a 60 y.o. female with a boil on the left buttock over the last 2 years.  It seems to worsen during the summer when she is sweating.  She recently had a significant flareup with drainage, started on doxycycline which she finished.  The drainage and tenderness remarkably diminished to where is no longer draining.  She denies fevers and chills.  She denies uncontrolled blood sugars.  She still smokes on occasion.  She is afraid of needles.  Past Medical History Past Medical History:  Diagnosis Date   Abnormal iron saturation 12/16/2019   Anemia    Chronic sinusitis    Diabetes mellitus without complication (HCC)    GERD (gastroesophageal reflux disease)    Hyperlipidemia    Hypertension    Tobacco abuse       Past Surgical History:  Procedure Laterality Date   BREAST BIOPSY Right 02/20/2018   top hat, neg   CESAREAN SECTION     NASAL SINUS SURGERY  2003   TUBAL LIGATION  1995    Allergies  Allergen Reactions   Septra Ds [Sulfamethoxazole-Trimethoprim] Hives    Current Outpatient Medications  Medication Sig Dispense Refill   aspirin EC 81 MG tablet Take 1 tablet (81 mg total) by mouth daily. 30 tablet 11   atorvastatin (LIPITOR) 20 MG tablet TAKE 1 TABLET DAILY 90 tablet 3   Blood Glucose Monitoring Suppl (ONETOUCH VERIO FLEX SYSTEM) w/Device KIT Use to check blood sugar up to TID. 1 kit 3   Cholecalciferol (VITAMIN D) 50 MCG (2000 UT) CAPS Take 1,000 Units by mouth daily.      glipiZIDE (GLUCOTROL) 10 MG tablet Take 1 tablet (10 mg total) by mouth 2 (two) times daily before a meal. 180 tablet 1   Lancets (ONETOUCH ULTRASOFT) lancets Use as instructed to check sugars up to TID 300 each 3   losartan-hydrochlorothiazide (HYZAAR) 100-25 MG tablet TAKE 1 TABLET DAILY 90 tablet 3   ONETOUCH VERIO test strip USE TO TEST BLOOD  SUGARS TWICE DAILY 200 strip 1   pioglitazone (ACTOS) 15 MG tablet TAKE 1 TABLET DAILY 90 tablet 3   sitaGLIPtin (JANUVIA) 50 MG tablet Take 1 tablet (50 mg total) by mouth daily. 90 tablet 1   No current facility-administered medications for this visit.    Family History Family History  Problem Relation Age of Onset   Cancer Brother    Coronary artery disease Mother    Hypertension Mother    Coronary artery disease Father    Heart disease Father    Diabetes Sister    Stroke Paternal Uncle    Coronary artery disease Sister 92   Breast cancer Cousin 65      Social History Social History   Tobacco Use   Smoking status: Every Day    Packs/day: 0.50    Types: Cigarettes   Smokeless tobacco: Never  Substance Use Topics   Alcohol use: No   Drug use: No        Review of Systems  Constitutional:  Positive for weight loss (Intentional).  HENT: Negative.    Eyes: Negative.   Respiratory: Negative.    Cardiovascular: Negative.   Gastrointestinal: Negative.   Genitourinary: Negative.   Skin:  Positive for itching (Limited to the site of healing left buttock boil).  Neurological: Negative.  Psychiatric/Behavioral: Negative.       Physical Exam Blood pressure (!) 145/91, pulse 80, temperature 98.4 F (36.9 C), temperature source Oral, height _0  (1.575 m), weight 208 lb 12.8 oz (94.7 kg), SpO2 97 %. Last Weight  Most recent update: 01/05/2021  1:57 PM    Weight  94.7 kg (208 lb 12.8 oz)             CONSTITUTIONAL: Well developed, and nourished, appropriately responsive and aware without distress.   EYES: Sclera non-icteric.   EARS, NOSE, MOUTH AND THROAT: Mask worn.    Hearing is intact to voice.  NECK: Trachea is midline, and there is no jugular venous distension.  LYMPH NODES:  Lymph nodes in the neck are not enlarged. RESPIRATORY:  Lungs are clear, and breath sounds are equal bilaterally. Normal respiratory effort without pathologic use of accessory  muscles. CARDIOVASCULAR: Heart is regular in rate and rhythm. GI: The abdomen is soft, nontender, and nondistended. There were no palpable masses. I did not appreciate hepatosplenomegaly. There were normal bowel sounds. MUSCULOSKELETAL:  Symmetrical muscle tone appreciated in all four extremities.    SKIN: Skin turgor is normal. No pathologic skin lesions appreciated.  On the medial aspect of the upper left buttock very near the natal cleft there is a scarred cystic area without acute inflammation.  It is not currently draining as there is no opening.  But I can sense a cystic content present.  Its not remarkably tenderness at present. NEUROLOGIC:  Motor and sensation appear grossly normal.  Cranial nerves are grossly without defect. PSYCH:  Alert and oriented to person, place and time. Affect is appropriate for situation.  Data Reviewed I have personally reviewed what is currently available of the patient's imaging, recent labs and medical records.   Labs:  CBC Latest Ref Rng & Units 04/01/2020 10/22/2016  WBC 4.0 - 10.5 K/uL 5.2 8.0  Hemoglobin 12.0 - 15.0 g/dL 14.6 14.4  Hematocrit 36.0 - 46.0 % 42.5 41.7  Platelets 150.0 - 400.0 K/uL 250.0 336.0   CMP Latest Ref Rng & Units 12/08/2020 03/24/2020 12/11/2019  Glucose 70 - 99 mg/dL 134(H) 224(H) 304(H)  BUN 6 - 23 mg/dL _1 Creatinine 0.40 - 1.20 mg/dL 0.69 0.68 0.75  Sodium 135 - 145 mEq/L 139 137 136  Potassium 3.5 - 5.1 mEq/L 4.1 3.7 3.8  Chloride 96 - 112 mEq/L 103 101 103  CO2 19 - 32 mEq/L _2 Calcium 8.4 - 10.5 mg/dL 9.5 9.5 9.8  Total Protein 6.0 - 8.3 g/dL 6.8 6.9 7.2  Total Bilirubin 0.2 - 1.2 mg/dL 0.8 0.9 1.0  Alkaline Phos 39 - 117 U/L 97 146(H) 336(H)  AST 0 - 37 U/L 37 32 76(H)  ALT 0 - 35 U/L 51(H) 44(H) 102(H)      Imaging:  Within last 24 hrs: No results found.  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 Patient Active Problem List   Diagnosis Date Noted   Boil of buttock  12/13/2020   Breast calcifications on mammogram 01/22/2018   Screening for colon cancer 09/21/2017   Herpes simplex antibody positive 02/01/2017   Hidradenitis suppurativa 16/02/9603   Complicated grief 54/01/8118   Pure hypercholesterolemia 09/04/2016   Encounter for preventive health examination 11/08/2014   Pap smear for cervical cancer screening 11/05/2014   Breast cancer screening 11/05/2014   Statin intolerance 01/10/2014   Obesity, morbid, BMI 40.0-49.9 ([place]) 02/09/2013   Obesity, diabetes, and hypertension  syndrome ([place]) 08/01/2012   Allergic rhinitis 10/21/2011   Tobacco abuse counseling 04/01/2011   Hyperlipidemia    Essential hypertension    GERD (gastroesophageal reflux disease)    Tobacco abuse     Plan    Need to perform an adequate I&D in order to allow this cavity to heal from inside out.  We discussed the risks and benefits of local anesthesia and a wider excision/unroofing of this chronic cavity.  She excepted the risks and desires to proceed.  Procedure: The patient in a prone position, prepped and draped the upper medial left buttock in the usual sterile manner.  Local infiltration with 1% lidocaine with epi is applied to adequate anesthetic effect.  I initially made an incision through the previously draining site with an 11 blade and was able to eventually identify the location of the cavity.  I then extended the incision caudad to open the larger cavity.  I then proceeded with elliptical excisions to unroofed the overlying cavity so that the patient would have an easier time addressing the depth of her wound.  This created a roughly a 2.5 cm circular wound with a depth of 3 cm, whose cavity was filled with a proud flesh tissue.  Attempted debridement of the proud flesh tissue utilizing dry gauze.  I then packed it with quarter inch iodoform packing strip.  I demonstrated and gave her instructions on how to care for her wound.  I believe she understands its important  to keep it open and to address it on a daily basis.  She may shower with allowing water to cleanse her wound.  She is encouraged to probe it with a Q-tip or with fingertip, and ensure the depth of packing to be done on a daily basis. Though her help at home is limited, I believe she will follow through to the best of her ability, and I will see her back in 2 weeks to assess her progress.  Face-to-face time spent with the patient and accompanying care providers(if present) was 30 minutes, with more than 50% of the time spent counseling, educating, and coordinating care of the patient.    These notes generated with voice recognition software. I apologize for typographical errors.  Ronny Bacon M.D., FACS 01/05/2021, 2:45 PM

## 2021-01-05 NOTE — Patient Instructions (Addendum)
Change your dressing daily and let water run through while in the shower.   If you have any concerns or questions, please feel free to call our office.  Incision and Drainage, Care After This sheet gives you information about how to care for yourself after your procedure. Your health care provider may also give you more specific instructions. If you have problems or questions, contact your health careprovider. What can I expect after the procedure? After the procedure, it is common to have: Pain or discomfort around the incision site. Blood, fluid, or pus (drainage) from the incision. Redness and firm skin around the incision site. Follow these instructions at home: Medicines Take over-the-counter and prescription medicines only as told by your health care provider. If you were prescribed an antibiotic medicine, use or take it as told by your health care provider. Do not stop using the antibiotic even if you start to feel better. Wound care Follow instructions from your health care provider about how to take care of your wound. Make sure you: Wash your hands with soap and water before and after you change your bandage (dressing). If soap and water are not available, use hand sanitizer. Change your dressing and packing as told by your health care provider. If your dressing is dry or stuck when you try to remove it, moisten or wet the dressing with saline or water so that it can be removed without harming your skin or tissues. If your wound is packed, leave it in place until your health care provider tells you to remove it. To remove the packing, moisten or wet the packing with saline or water so that it can be removed without harming your skin or tissues. Leave stitches (sutures), skin glue, or adhesive strips in place. These skin closures may need to stay in place for 2 weeks or longer. If adhesive strip edges start to loosen and curl up, you may trim the loose edges. Do not remove adhesive  strips completely unless your health care provider tells you to do that. Check your wound every day for signs of infection. Check for: More redness, swelling, or pain. More fluid or blood. Warmth. Pus or a bad smell. If you were sent home with a drain tube in place, follow instructions from your health care provider about: How to empty it. How to care for it at home.  General instructions Rest the affected area. Do not take baths, swim, or use a hot tub until your health care provider approves. Ask your health care provider if you may take showers. You may only be allowed to take sponge baths. Return to your normal activities as told by your health care provider. Ask your health care provider what activities are safe for you. Your health care provider may put you on activity or lifting restrictions. The incision will continue to drain. It is normal to have some clear or slightly bloody drainage. The amount of drainage should lessen each day. Do not apply any creams, ointments, or liquids unless you have been told to by your health care provider. Keep all follow-up visits as told by your health care provider. This is important. Contact a health care provider if: Your cyst or abscess returns. You have a fever or chills. You have more redness, swelling, or pain around your incision. You have more fluid or blood coming from your incision. Your incision feels warm to the touch. You have pus or a bad smell coming from your incision. You have red streaks  above or below the incision site. Get help right away if: You have severe pain or bleeding. You cannot eat or drink without vomiting. You have decreased urine output. You become short of breath. You have chest pain. You cough up blood. The affected area becomes numb or starts to tingle. These symptoms may represent a serious problem that is an emergency. Do not wait to see if the symptoms will go away. Get medical help right away. Call your  local emergency services (911 in the [place] Do not drive yourself to the hospital. Summary After this procedure, it is common to have fluid, blood, or pus coming from the surgery site. Follow all home care instructions. You will be told how to take care of your incision, how to check for infection, and how to take medicines. If you were prescribed an antibiotic medicine, take it as told by your health care provider. Do not stop taking the antibiotic even if you start to feel better. Contact a health care provider if you have increased redness, swelling, or pain around your incision. Get help right away if you have chest pain, you vomit, you cough up blood, or you have shortness of breath. Keep all follow-up visits as told by your health care provider. This is important. This information is not intended to replace advice given to you by your health care provider. Make sure you discuss any questions you have with your healthcare provider. Document Revised: 03/31/2018 Document Reviewed: 03/31/2018 Elsevier Patient Education  2022 [employer].

## 2021-01-06 ENCOUNTER — Telehealth: Payer: Self-pay | Admitting: Pharmacist

## 2021-01-06 ENCOUNTER — Telehealth: Payer: BC Managed Care – PPO

## 2021-01-06 NOTE — Telephone Encounter (Signed)
  Chronic Care Management   Note  01/06/2021 Name: Katelyn Proctor MRN: [id number] DOB: [birth year]   Attempted to contact patient for scheduled appointment for medication management support. Patient called back, noted that she wished to reschedule for next week. Reschedule for next week.  [name], PharmD, [place], CPP Clinical Pharmacist [facility] [facility] [phone]

## 2021-01-13 ENCOUNTER — Telehealth: Payer: Self-pay | Admitting: Pharmacist

## 2021-01-13 ENCOUNTER — Telehealth: Payer: BC Managed Care – PPO

## 2021-01-13 NOTE — Telephone Encounter (Signed)
  Chronic Care Management   Note  01/13/2021 Name: Katelyn Proctor MRN: [id number] DOB: May 01, 1961   Attempted to contact patient for scheduled appointment for medication management support. She noted that she really didn't feel up to talking due to her foot. Requested that we reschedule for next month.   [name], PharmD, [place], CPP Clinical Pharmacist [facility] [employer] [facility] [phone]

## 2021-01-16 LAB — ANAEROBIC AND AEROBIC CULTURE

## 2021-01-17 ENCOUNTER — Encounter: Payer: Self-pay | Admitting: Podiatry

## 2021-01-17 ENCOUNTER — Other Ambulatory Visit: Payer: Self-pay

## 2021-01-17 ENCOUNTER — Ambulatory Visit (INDEPENDENT_AMBULATORY_CARE_PROVIDER_SITE_OTHER): Payer: BC Managed Care – PPO | Admitting: Podiatry

## 2021-01-17 DIAGNOSIS — B070 Plantar wart: Secondary | ICD-10-CM | POA: Diagnosis not present

## 2021-01-17 NOTE — Progress Notes (Signed)
   Subjective: Patient presents today for follow-up evaluation of a plantar wart to the right lower extremity. Patient presents today for follow-up treatment and evaluation   Objective: Physical Exam General: The patient is alert and oriented x3 in no acute distress.   Dermatology: Hyperkeratotic skin lesion noted to the plantar aspect of the right foot approximately 1 cm in diameter. Pinpoint bleeding noted upon debridement. Skin is warm, dry and supple bilateral lower extremities. Negative for open lesions or macerations.   Vascular: Palpable pedal pulses bilaterally. No edema or erythema noted. Capillary refill within normal limits.   Neurological: Epicritic and protective threshold grossly intact bilaterally.    Musculoskeletal Exam: Improved pain on palpation to the noted skin lesion.  Range of motion within normal limits to all pedal and ankle joints bilateral. Muscle strength 5/5 in all groups bilateral.    Assessment: #1 plantar wart right foot #2 pain in right foot     Plan of Care:  #1 Patient was evaluated. #2 Excisional debridement of the plantar wart lesion was performed using a chisel blade.  Overall there is some significant improvement of the plantar wart lesion. #3  Patient may discontinue the salicylic acid provided here in the office.  Recommend perhaps every other day x1-2 additional weeks #4 return to clinic as needed  *Works at [employer] on her feet all day      Felecia Shelling, DPM [facility]  Dr. Felecia Shelling, DPM    [address] [place], [place] [address]                Office [phone]  Fax [phone]

## 2021-01-19 ENCOUNTER — Encounter: Payer: Self-pay | Admitting: Surgery

## 2021-01-19 ENCOUNTER — Ambulatory Visit (INDEPENDENT_AMBULATORY_CARE_PROVIDER_SITE_OTHER): Payer: BC Managed Care – PPO | Admitting: Surgery

## 2021-01-19 ENCOUNTER — Other Ambulatory Visit: Payer: Self-pay

## 2021-01-19 VITALS — BP 150/89 | HR 81 | Temp 98.3°F | Ht 62.0 in | Wt 210.0 lb

## 2021-01-19 DIAGNOSIS — L0232 Furuncle of buttock: Secondary | ICD-10-CM | POA: Diagnosis not present

## 2021-01-19 NOTE — Progress Notes (Signed)
[facility] POST-OP OFFICE VISIT  01/19/2021  HPI: Katelyn Proctor is a 60 y.o. female 2 weeks s/p incision and drainage of boil on left medial buttock.  Faithfully doing dressing changes, no longer able to continue packing.  Denies fevers, chills.  Reports she took all her antibiotics.  Presents today for follow-up wound care.  Vital signs: BP (!) 150/89   Pulse 81   Temp 98.3 F (36.8 C)   Ht 5\' 2"  (1.575 m)   Wt 210 lb (95.3 kg)   SpO2 96%   BMI 38.41 kg/m    Physical Exam: Constitutional: She appears well.  Skin: On the medial upper left buttock near the natal cleft, there is a small residual open wound which is well granulated, portion of it is about 4 mm in depth with some adherent exudative debris present.  This was cleansed as I probed the wound with a dry Q-tip.  About 1 cm in diameter.  It is making excellent progress.  Assessment/Plan: Left buttock wound progressing well.  Continue current dressing changes.  We discussed cleansing with Q-tip assistance when showering.  Patient Active Problem List   Diagnosis Date Noted   Boil of buttock 12/13/2020   Breast calcifications on mammogram 01/22/2018   Screening for colon cancer 09/21/2017   Herpes simplex antibody positive 02/01/2017   Hidradenitis suppurativa 10/23/2016   Complicated grief 10/23/2016   Pure hypercholesterolemia 09/04/2016   Encounter for preventive health examination 11/08/2014   Pap smear for cervical cancer screening 11/05/2014   Breast cancer screening 11/05/2014   Statin intolerance 01/10/2014   Obesity, morbid, BMI 40.0-49.9 ([facility]) 02/09/2013   Obesity, diabetes, and hypertension syndrome ([facility]) 08/01/2012   Allergic rhinitis 10/21/2011   Tobacco abuse counseling 04/01/2011   Hyperlipidemia    Essential hypertension    GERD (gastroesophageal reflux disease)    Tobacco abuse     -We glad to see her back in a month as needed to evaluate/complete her healing progress.   04/03/2011 M.D., FACS 01/19/2021, 2:10 PM

## 2021-01-19 NOTE — Patient Instructions (Signed)
Try to keep a scab from forming yet by using a q-tip on the area after your shower to keep it open a little longer. Then apply your dressing.  Follow up in one month.

## 2021-01-24 ENCOUNTER — Ambulatory Visit: Payer: BC Managed Care – PPO | Admitting: Podiatry

## 2021-02-01 ENCOUNTER — Telehealth: Payer: Self-pay | Admitting: Internal Medicine

## 2021-02-01 NOTE — Telephone Encounter (Signed)
Patient informed, Due to the high volume of calls and your symptoms we have to forward your call to our Triage Nurse to expedient your call. Please hold for the transfer.  Patient transferred to Access Nurse. Due to having fluid around her right knee that has been causing her trouble walking for the past 2-3 weeks.No openings in office or virtual.

## 2021-02-01 NOTE — Telephone Encounter (Signed)
Access nurse instructed patient to be evaluated within 3-4 hours. Access nurse also gave care advise. Per their note.

## 2021-02-01 NOTE — Telephone Encounter (Signed)
Patient was informed. She stated she going to try OTC medication tonight then go to [facility] tomorrow.

## 2021-02-03 DIAGNOSIS — M2391 Unspecified internal derangement of right knee: Secondary | ICD-10-CM | POA: Diagnosis not present

## 2021-02-03 DIAGNOSIS — M1712 Unilateral primary osteoarthritis, left knee: Secondary | ICD-10-CM | POA: Diagnosis not present

## 2021-02-03 DIAGNOSIS — M25561 Pain in right knee: Secondary | ICD-10-CM | POA: Diagnosis not present

## 2021-02-13 ENCOUNTER — Ambulatory Visit: Payer: BC Managed Care – PPO | Admitting: Pharmacist

## 2021-02-13 DIAGNOSIS — E1165 Type 2 diabetes mellitus with hyperglycemia: Secondary | ICD-10-CM

## 2021-02-13 DIAGNOSIS — I10 Essential (primary) hypertension: Secondary | ICD-10-CM

## 2021-02-13 DIAGNOSIS — E7800 Pure hypercholesterolemia, unspecified: Secondary | ICD-10-CM

## 2021-02-13 NOTE — Chronic Care Management (AMB) (Signed)
Care Management   Pharmacy Note  02/13/2021 Name: ELANORA QUIN MRN: [id number] DOB: [birth year]  Subjective: CONCEPTION DOEBLER is a 60 y.o. year old female who is a primary care patient of Crecencio Mc, MD. The Care Management team was consulted for assistance with care management and care coordination needs.    Engaged with patient by telephone for follow up visit in response to provider referral for pharmacy case management and/or care coordination services.   The patient was given information about Care Management services today including:  Care Management services includes personalized support from designated clinical staff supervised by the patient's primary care provider, including individualized plan of care and coordination with other care providers. 24/7 contact phone numbers for assistance for urgent and routine care needs. The patient may stop case management services at any time by phone call to the office staff.  Patient agreed to services and consent obtained.  Assessment:  Review of patient status, including review of consultants reports, laboratory and other test data, was performed as part of comprehensive evaluation and provision of chronic care management services.   SDOH (Social Determinants of Health) assessments and interventions performed:  SDOH Interventions    Flowsheet Row Most Recent Value  SDOH Interventions   Financial Strain Interventions Intervention Not Indicated        Objective:  Lab Results  Component Value Date   CREATININE 0.69 12/08/2020   CREATININE 0.68 03/24/2020   CREATININE 0.75 12/11/2019    Lab Results  Component Value Date   HGBA1C 7.1 (H) 12/08/2020       Component Value Date/Time   CHOL 146 12/08/2020 0831   TRIG 49.0 12/08/2020 0831   HDL 53.40 12/08/2020 0831   CHOLHDL 3 12/08/2020 0831   VLDL 9.8 12/08/2020 0831   LDLCALC 83 12/08/2020 0831   LDLDIRECT 160.0 09/18/2017 1413     BP Readings from Last 3  Encounters:  01/19/21 (!) 150/89  01/05/21 (!) 145/91  12/12/20 132/74    Care Plan  Allergies  Allergen Reactions   Septra Ds [Sulfamethoxazole-Trimethoprim] Hives    Medications Reviewed Today     Reviewed by De Hollingshead, RPH-CPP (Pharmacist) on 02/13/21 at 1336  Med List Status: <None>   Medication Order Taking? Sig Documenting Provider Last Dose Status Informant  aspirin EC 81 MG tablet [id number] Yes Take 1 tablet (81 mg total) by mouth daily. Crecencio Mc, MD Taking Active   atorvastatin (LIPITOR) 20 MG tablet [id number] Yes TAKE 1 TABLET DAILY Crecencio Mc, MD Taking Active   Blood Glucose Monitoring Suppl (ONETOUCH VERIO FLEX SYSTEM) w/Device KIT [id number]  Use to check blood sugar up to TID. Crecencio Mc, MD  Active   Cholecalciferol (VITAMIN D) 50 MCG (2000 UT) CAPS [id number] Yes Take 1,000 Units by mouth daily.  [provider] Taking Active     Discontinued 08/07/11 1328 (Error)   glipiZIDE (GLUCOTROL) 10 MG tablet [id number] Yes Take 1 tablet (10 mg total) by mouth 2 (two) times daily before a meal. Crecencio Mc, MD Taking Active   Lancets [facility] ULTRASOFT) lancets [id number]  Use as instructed to check sugars up to TID Crecencio Mc, MD  Active   losartan-hydrochlorothiazide [facility]) 100-25 MG tablet [id number] Yes TAKE 1 TABLET DAILY Crecencio Mc, MD Taking Active   meloxicam (MOBIC) 15 MG tablet [id number] Yes Take 15 mg by mouth daily. [provider] Taking Active            Med  Note Mayo Ao Feb 13, 2021  1:33 PM) Taking PRN  Roma Schanz test strip [id number]  USE TO TEST BLOOD SUGARS TWICE DAILY Crecencio Mc, MD  Active   pioglitazone (ACTOS) 15 MG tablet [id number] Yes TAKE 1 TABLET DAILY Crecencio Mc, MD Taking Active   sitaGLIPtin (JANUVIA) 50 MG tablet [id number] Yes Take 1 tablet (50 mg total) by mouth daily. Crecencio Mc, MD Taking Active             Patient Active Problem List    Diagnosis Date Noted   Boil of buttock 12/13/2020   Breast calcifications on mammogram 01/22/2018   Screening for colon cancer 09/21/2017   Herpes simplex antibody positive 02/01/2017   Hidradenitis suppurativa 16/38/4665   Complicated grief 99/35/7017   Pure hypercholesterolemia 09/04/2016   Encounter for preventive health examination 11/08/2014   Pap smear for cervical cancer screening 11/05/2014   Breast cancer screening 11/05/2014   Statin intolerance 01/10/2014   Obesity, morbid, BMI 40.0-49.9 ([place]) 02/09/2013   Obesity, diabetes, and hypertension syndrome ([place]) 08/01/2012   Allergic rhinitis 10/21/2011   Tobacco abuse counseling 04/01/2011   Hyperlipidemia    Essential hypertension    GERD (gastroesophageal reflux disease)    Tobacco abuse     Conditions to be addressed/monitored: HTN, HLD, and DMII  Care Plan : Medication Management  Updates made by De Hollingshead, RPH-CPP since 02/13/2021 12:00 AM     Problem: Diabetes, HTN, HLD      Long-Range Goal: Disease Progression Prevention   Start Date: 05/18/2020  This Visit's Progress: On track  Recent Progress: On track  Priority: High  Note:   Current Barriers:  Unable to achieve control of diabetes   Pharmacist Clinical Goal(s):  Over the next 90 days, patient will achieve control of diabetes as evidenced by A1c  through collaboration with PharmD and provider.   Interventions: 1:1 collaboration with Crecencio Mc, MD regarding development and update of comprehensive plan of care as evidenced by provider attestation and co-signature Inter-disciplinary care team collaboration (see longitudinal plan of care) Comprehensive medication review performed; medication list updated in electronic medical record  Diabetes: Uncontrolled but improving; current treatment: Januvia 50 mg daily, pioglitazone 15 mg daily, glipizide 10 mg BID Reports less physical activity lately d/t foot pain, buttocks boil, and now R knee pain  has negatively impacted glycemic control Hx metformin; notes that it caused significant diarrhea. Does not appear that she ever started metformin XR d/t cost, but refuses to try Is afraid of needles and is adamantly against injectable options Hx Rybelsus - GI upset w/ 7 mg dose Hx Jardiance - intolerance d/t itching, lack of energy, stomach problems Current glucose readings: reports fasting sugars 160-180; post prandials: elevated as well per patient report, but she did not report readings Current exercise: exercises limited lately by knee pain.  Denies any episodes of hypoglycemia or any symptoms of hypoglycemia Suggested dose increase of Januvia, as her renal function is appropriate for full dose. Patient declines at this time, she prefers to focus on lifestyle modifications Reviewed goal A1c, goal fasting, and goal 2 hour post prandial glucose readings. Encouraged to continue to document and provide at next provider visit.   Hypertension: Controlled per last clinic reading; current treatment: losartan/HCTZ 100/25 mg daily Current home readings: has not been checking at home lately, but feels that it has probably been higher lately due to stress, frustration, pain. Feels more back to "normal" now.  She does not have a blood pressure cuff, but reports that her sister does. Encouraged to periodically check at home, document, and bring to PCP with next follow up.  Recommend to continue current regimen at this time  Hyperlipidemia: Controlled per last lipid panel; current treatment: atorvastatin 20 mg daily   Current antiplatelet regimen: aspirin 81 mg daily Recommended to continue current regimen at this time  R Knee Pain: Reports worsening pain recently, did go to Ortho walk in, had x ray. Reports it showed spurs in L knee, but pain was in R. Was given script for meloxicam 15 mg daily that she took for several days, but it impacted her sleep so she has been taking about every other day. Also  using a knee brace, acetaminophen 500 mg PRN Discussed she could also try 7.5 mg (1/2 tablet) to see if reduced side effects. Patient verbalized understanding  Patient Goals/Self-Care Activities Over the next 90 days, patient will:  - take medications as prescribed check glucose twice daily, document, and provide at future appointments  Follow Up Plan: Telephone follow up appointment with care management team member scheduled for: ~ 12 weeks (PCP f/u in ~ 4 weeks)     Medication Assistance:  None required.  Patient affirms current coverage meets needs.  Follow Up:  Patient agrees to Care Plan and Follow-up.  Plan: Telephone follow up appointment with care management team member scheduled for:  ~12 weeks  [name] Darnelle Maffucci, PharmD, [place], [place] Clinical Pharmacist [employer] at [employer] [phone]

## 2021-02-13 NOTE — Patient Instructions (Signed)
Visit Information   Goals Addressed               This Visit's Progress     Patient Stated     Medication Monitoring (pt-stated)        Patient Goals/Self-Care Activities Over the next 90 days, patient will:  - take medications as prescribed check glucose at least twice daily, document, and provide at future appointments        Patient verbalizes understanding of instructions provided today and agrees to view in MyChart.   Plan: Telephone follow up appointment with care management team member scheduled for:  ~12 weeks  [name], PharmD, [place], CPP Clinical Pharmacist [employer] at [employer] [phone]

## 2021-02-14 ENCOUNTER — Other Ambulatory Visit: Payer: Self-pay | Admitting: Internal Medicine

## 2021-02-14 DIAGNOSIS — Z1231 Encounter for screening mammogram for malignant neoplasm of breast: Secondary | ICD-10-CM

## 2021-02-16 ENCOUNTER — Ambulatory Visit: Payer: BC Managed Care – PPO | Admitting: Surgery

## 2021-03-03 ENCOUNTER — Other Ambulatory Visit: Payer: Self-pay

## 2021-03-03 ENCOUNTER — Ambulatory Visit
Admission: RE | Admit: 2021-03-03 | Discharge: 2021-03-03 | Disposition: A | Discharge status: Home / Self Care | Payer: BC Managed Care – PPO | Source: Ambulatory Visit | Attending: Internal Medicine | Admitting: Internal Medicine

## 2021-03-03 DIAGNOSIS — Z1231 Encounter for screening mammogram for malignant neoplasm of breast: Secondary | ICD-10-CM | POA: Insufficient documentation

## 2021-03-13 ENCOUNTER — Other Ambulatory Visit (INDEPENDENT_AMBULATORY_CARE_PROVIDER_SITE_OTHER): Payer: BC Managed Care – PPO

## 2021-03-13 ENCOUNTER — Other Ambulatory Visit: Payer: Self-pay

## 2021-03-13 DIAGNOSIS — E1165 Type 2 diabetes mellitus with hyperglycemia: Secondary | ICD-10-CM | POA: Diagnosis not present

## 2021-03-13 LAB — COMPREHENSIVE METABOLIC PANEL
ALT: 55 U/L — ABNORMAL HIGH (ref 0–35)
AST: 44 U/L — ABNORMAL HIGH (ref 0–37)
Albumin: 4 g/dL (ref 3.5–5.2)
Alkaline Phosphatase: 107 U/L (ref 39–117)
BUN: 26 mg/dL — ABNORMAL HIGH (ref 6–23)
CO2: 26 mEq/L (ref 19–32)
Calcium: 9.4 mg/dL (ref 8.4–10.5)
Chloride: 104 mEq/L (ref 96–112)
Creatinine, Ser: 0.76 mg/dL (ref 0.40–1.20)
GFR: 84.46 mL/min (ref >60.00)
Glucose, Bld: 133 mg/dL — ABNORMAL HIGH (ref 70–99)
Potassium: 3.9 mEq/L (ref 3.5–5.1)
Sodium: 137 mEq/L (ref 135–145)
Total Bilirubin: 0.7 mg/dL (ref 0.2–1.2)
Total Protein: 7 g/dL (ref 6.0–8.3)

## 2021-03-13 LAB — HEMOGLOBIN A1C: Hgb A1c MFr Bld: 7.6 % — ABNORMAL HIGH (ref 4.6–6.5)

## 2021-03-14 ENCOUNTER — Ambulatory Visit (INDEPENDENT_AMBULATORY_CARE_PROVIDER_SITE_OTHER): Payer: BC Managed Care – PPO | Admitting: Internal Medicine

## 2021-03-14 ENCOUNTER — Encounter: Payer: Self-pay | Admitting: Internal Medicine

## 2021-03-14 VITALS — BP 126/64 | HR 91 | Temp 96.7°F | Ht 62.0 in | Wt 212.6 lb

## 2021-03-14 DIAGNOSIS — M170 Bilateral primary osteoarthritis of knee: Secondary | ICD-10-CM

## 2021-03-14 DIAGNOSIS — I10 Essential (primary) hypertension: Secondary | ICD-10-CM | POA: Diagnosis not present

## 2021-03-14 DIAGNOSIS — L0232 Furuncle of buttock: Secondary | ICD-10-CM | POA: Diagnosis not present

## 2021-03-14 DIAGNOSIS — M179 Osteoarthritis of knee, unspecified: Secondary | ICD-10-CM | POA: Insufficient documentation

## 2021-03-14 DIAGNOSIS — I152 Hypertension secondary to endocrine disorders: Secondary | ICD-10-CM

## 2021-03-14 DIAGNOSIS — I872 Venous insufficiency (chronic) (peripheral): Secondary | ICD-10-CM

## 2021-03-14 DIAGNOSIS — E119 Type 2 diabetes mellitus without complications: Secondary | ICD-10-CM

## 2021-03-14 DIAGNOSIS — E1159 Type 2 diabetes mellitus with other circulatory complications: Secondary | ICD-10-CM

## 2021-03-14 DIAGNOSIS — R921 Mammographic calcification found on diagnostic imaging of breast: Secondary | ICD-10-CM

## 2021-03-14 DIAGNOSIS — R748 Abnormal levels of other serum enzymes: Secondary | ICD-10-CM

## 2021-03-14 DIAGNOSIS — E1169 Type 2 diabetes mellitus with other specified complication: Secondary | ICD-10-CM

## 2021-03-14 DIAGNOSIS — E669 Obesity, unspecified: Secondary | ICD-10-CM

## 2021-03-14 NOTE — Assessment & Plan Note (Signed)
ETIOLOGY UNCLEAR.  No prior ultrasound or screening for viral hepatitis ,  autoimmuen syndromes.  Fatty liver likely given current metabolic state.  Adding additional serologies to next lab draw

## 2021-03-14 NOTE — Assessment & Plan Note (Signed)
Mammogram normal Mar 03 2021

## 2021-03-14 NOTE — Progress Notes (Signed)
Subjective:  Patient ID: Katelyn Proctor, female    DOB: [birth year]  Age: 60 y.o. MRN: [id number]  CC: The primary encounter diagnosis was Venous insufficiency of both lower extremities. Diagnoses of Boil of buttock, Breast calcifications on mammogram, Essential hypertension, Obesity, diabetes, and hypertension syndrome ([place]), Elevated liver enzymes, and Osteoarthritis of both knees, unspecified osteoarthritis type were also pertinent to this visit.  HPI [name] presents for  Chief Complaint  Patient presents with   Follow-up    3 month follow up on diabetes, hypertension   This visit occurred during the SARS-CoV-2 public health emergency.  Safety protocols were in place, including screening questions prior to the visit, additional usage of staff PPE, and extensive cleaning of exam room while observing appropriate contact time as indicated for disinfecting solutions.    Uncontrolled diabetes:  met with [name] recently .   Deferred increase in januvia dose which was recommended  in favor of lifestyle modifications.  Other options reviewed and either tried and not tolerated,  or refused due to needle phobia.  She has been limited in exercise due to knee pain .  She recently walked 2 [place] in spite of right knee pain .    Knee pain:  Saw ortho,  x ryas done and  bone spurs noted . Meloxicam,  using it prn  because it affected her sleep.   Using prn tylenol,  not daily .  has not tried voltaren   Has started wearing compression knee highs ; couldn't get them to stay on . 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  Home BP readings have been done about once per week and are  generally < 130/80 .  She is avoiding added salt in her diet and walking  occasionally  .     Outpatient Medications Prior to Visit  Medication Sig Dispense Refill   aspirin EC 81 MG tablet Take 1 tablet (81 mg total) by mouth daily.  30 tablet 11   atorvastatin (LIPITOR) 20 MG tablet TAKE 1 TABLET DAILY 90 tablet 3   Blood Glucose Monitoring Suppl (ONETOUCH VERIO FLEX SYSTEM) w/Device KIT Use to check blood sugar up to TID. 1 kit 3   Cholecalciferol (VITAMIN D) 50 MCG (2000 UT) CAPS Take 1,000 Units by mouth daily.      glipiZIDE (GLUCOTROL) 10 MG tablet Take 1 tablet (10 mg total) by mouth 2 (two) times daily before a meal. 180 tablet 1   Lancets (ONETOUCH ULTRASOFT) lancets Use as instructed to check sugars up to TID 300 each 3   losartan-hydrochlorothiazide (HYZAAR) 100-25 MG tablet TAKE 1 TABLET DAILY 90 tablet 3   meloxicam (MOBIC) 15 MG tablet Take 15 mg by mouth daily.     meloxicam (MOBIC) 15 MG tablet meloxicam 15 mg tablet  Take 1 tablet every day by oral route.     ONETOUCH VERIO test strip USE TO TEST BLOOD SUGARS TWICE DAILY 200 strip 1   pioglitazone (ACTOS) 15 MG tablet TAKE 1 TABLET DAILY 90 tablet 3   sitaGLIPtin (JANUVIA) 50 MG tablet Take 1 tablet (50 mg total) by mouth daily. 90 tablet 1   No facility-administered medications prior to visit.    Review of Systems;  Patient denies headache, fevers, malaise, unintentional weight loss, skin rash, eye pain, sinus congestion and sinus pain, sore throat, dysphagia,  hemoptysis , cough, dyspnea, wheezing, chest pain, palpitations,  orthopnea, edema, abdominal pain, nausea, melena, diarrhea, constipation, flank pain, dysuria, hematuria, urinary  Frequency, nocturia, numbness, tingling, seizures,  Focal weakness, Loss of consciousness,  Tremor, insomnia, depression, anxiety, and suicidal ideation.      Objective:  BP 126/64 (BP Location: Left Arm, Patient Position: Sitting, Cuff Size: Large)   Pulse 91   Temp (!) 96.7 F (35.9 C) (Temporal)   Ht 5' 2" (1.575 m)   Wt 212 lb 9.6 oz (96.4 kg)   SpO2 98%   BMI 38.89 kg/m   BP Readings from Last 3 Encounters:  03/14/21 126/64  01/19/21 (!) 150/89  01/05/21 (!) 145/91    Wt Readings from Last 3  Encounters:  03/14/21 212 lb 9.6 oz (96.4 kg)  01/19/21 210 lb (95.3 kg)  01/05/21 208 lb 12.8 oz (94.7 kg)    General appearance: alert, cooperative and appears stated age Ears: normal TM's and external ear canals both ears Throat: lips, mucosa, and tongue normal; teeth and gums normal Neck: no adenopathy, no carotid bruit, supple, symmetrical, trachea midline and thyroid not enlarged, symmetric, no tenderness/mass/nodules Back: symmetric, no curvature. ROM normal. No CVA tenderness. Lungs: clear to auscultation bilaterally Heart: regular rate and rhythm, S1, S2 normal, no murmur, click, rub or gallop Abdomen: soft, non-tender; bowel sounds normal; no masses,  no organomegaly Pulses: 2+ and symmetric Skin: Skin color, texture, turgor normal. No rashes or lesions Lymph nodes: Cervical, supraclavicular, and axillary nodes normal.  Lab Results  Component Value Date   HGBA1C 7.6 (H) 03/13/2021   HGBA1C 7.1 (H) 12/08/2020   HGBA1C 9.6 (A) 04/12/2020    Lab Results  Component Value Date   CREATININE 0.76 03/13/2021   CREATININE 0.69 12/08/2020   CREATININE 0.68 03/24/2020    Lab Results  Component Value Date   WBC 5.2 04/01/2020   HGB 14.6 04/01/2020   HCT 42.5 04/01/2020   PLT 250.0 04/01/2020   GLUCOSE 133 (H) 03/13/2021   CHOL 146 12/08/2020   TRIG 49.0 12/08/2020   HDL 53.40 12/08/2020   LDLDIRECT 160.0 09/18/2017   LDLCALC 83 12/08/2020   ALT 55 (H) 03/13/2021   AST 44 (H) 03/13/2021   NA 137 03/13/2021   K 3.9 03/13/2021   CL 104 03/13/2021   CREATININE 0.76 03/13/2021   BUN 26 (H) 03/13/2021   CO2 26 03/13/2021   TSH 1.51 04/28/2019   HGBA1C 7.6 (H) 03/13/2021   MICROALBUR 3.6 (H) 03/17/2020    MM 3D SCREEN BREAST BILATERAL  Result Date: 03/08/2021 CLINICAL DATA:  Screening. EXAM: DIGITAL SCREENING BILATERAL MAMMOGRAM WITH TOMOSYNTHESIS AND CAD TECHNIQUE: Bilateral screening digital craniocaudal and mediolateral oblique mammograms were obtained. Bilateral  screening digital breast tomosynthesis was performed. The images were evaluated with computer-aided detection. COMPARISON:  Previous exam(s). ACR Breast Density Category b: There are scattered areas of fibroglandular density. FINDINGS: There are no findings suspicious for malignancy. IMPRESSION: No mammographic evidence of malignancy. A result letter of this screening mammogram will be mailed directly to the patient. RECOMMENDATION: Screening mammogram in one year. (Code:SM-B-01Y) BI-RADS CATEGORY  1: Negative. Electronically Signed   By: Kristopher Oppenheim M.D.   On: 03/08/2021 09:53   Assessment & Plan:   Problem List Items Addressed This Visit     Boil of buttock    Resolved after surgical I & D ,  She is using safeguard antibacterial soap.   Advised to add hibiclens once w weekly       Breast calcifications on mammogram    Mammogram normal  Mar 03 2021      DJD (degenerative joint disease) of knee    Bilateral, with bone spurring noted by orthopedist.  Trial of voltaren gel       Relevant Medications   meloxicam (MOBIC) 15 MG tablet   Elevated liver enzymes    ETIOLOGY UNCLEAR.  No prior ultrasound or screening for viral hepatitis ,  autoimmuen syndromes.  Fatty liver likely given current metabolic state.  Adding additional serologies to next lab draw       Relevant Orders   Hepatitis B core antibody, total   Hepatitis B surface antigen   Hepatitis B surface antibody,qualitative   Hepatitis C antibody   IBC + Ferritin   Essential hypertension    Well controlled on current regimen. Renal function stable, no changes today.  Lab Results  Component Value Date   CREATININE 0.76 03/13/2021         Obesity, diabetes, and hypertension syndrome ([place])    Weight gain and loss of control noted .  Increasing Januvia to 100 mg daily.  Return in 3 months       Relevant Orders   Comprehensive metabolic panel   Hemoglobin A1c   Lipid panel   Other Visit Diagnoses     Venous  insufficiency of both lower extremities    -  Primary   Relevant Orders   For home use only DME Other see comment       I am having Katelyn Proctor maintain her aspirin EC, Vitamin D, OneTouch Verio Flex System, onetouch ultrasoft, losartan-hydrochlorothiazide, atorvastatin, sitaGLIPtin, pioglitazone, glipiZIDE, OneTouch Verio, meloxicam, and meloxicam.  No orders of the defined types were placed in this encounter.   There are no discontinued medications.  Follow-up: Return in about 3 months (around 06/14/2021) for follow up diabetes.   Crecencio Mc, MD

## 2021-03-14 NOTE — Assessment & Plan Note (Signed)
Well controlled on current regimen. Renal function stable, no changes today.  Lab Results  Component Value Date   CREATININE 0.76 03/13/2021

## 2021-03-14 NOTE — Assessment & Plan Note (Signed)
Resolved after surgical I & D ,  She is using safeguard antibacterial soap.   Advised to add hibiclens once w weekly

## 2021-03-14 NOTE — Assessment & Plan Note (Signed)
Bilateral, with bone spurring noted by orthopedist.  Trial of voltaren gel

## 2021-03-14 NOTE — Assessment & Plan Note (Signed)
Weight gain and loss of control noted .  Increasing Januvia to 100 mg daily.  Return in 3 months

## 2021-03-14 NOTE — Patient Instructions (Addendum)
Ok to try  the voltaren cream for your knee.  It can be used 4 times daily  Limit tylenol and meloxicam to "as needed"    Better to walk for 30 minutes 5 days per week than 1 hour once a week   Increase your januvia dose to 100 mg daily and let me know if you tolerate dose so we can send  in higher dose for refill    Continue daily use of safeguard if it says "antibacterial"  Add once a week use of Hibiclens liquid soap.

## 2021-03-15 ENCOUNTER — Telehealth: Payer: Self-pay | Admitting: Internal Medicine

## 2021-03-15 NOTE — Telephone Encounter (Signed)
Spoke with pt and she stated that [facility] does not size pts for compression stockings. Pt was advised that she could try one of the medical supply stores in town and see if they do it. Pt was given a couple of places in town and the phone numbers to call and ask. Pt gave a verbal understanding.

## 2021-03-15 NOTE — Telephone Encounter (Signed)
Pt called in stating that she saw Dr. Darrick Huntsman on Tuesday 03/14/2021. Pt stated that Dr. Darrick Huntsman sent over a order for DME (Compression Sock) to a pharmacy in [place]. Pt would like to know what pharmacy/DME Facility did Dr. Darrick Huntsman send the DME (Compression Sock) order too. Pt would like callback at [phone]

## 2021-03-20 ENCOUNTER — Other Ambulatory Visit: Payer: Self-pay | Admitting: Internal Medicine

## 2021-03-20 DIAGNOSIS — I10 Essential (primary) hypertension: Secondary | ICD-10-CM

## 2021-03-23 ENCOUNTER — Other Ambulatory Visit: Payer: Self-pay | Admitting: Internal Medicine

## 2021-03-23 DIAGNOSIS — E1165 Type 2 diabetes mellitus with hyperglycemia: Secondary | ICD-10-CM

## 2021-04-10 ENCOUNTER — Other Ambulatory Visit: Payer: Self-pay | Admitting: Internal Medicine

## 2021-04-10 ENCOUNTER — Telehealth: Payer: Self-pay | Admitting: Internal Medicine

## 2021-04-10 NOTE — Telephone Encounter (Signed)
Pt called in regards to medication she is currently taking. sitaGLIPtin (JANUVIA) 50 MG tablet When pt was seen in office her dosage was upped to two pills a day. Pt currently has 4 days worth of pills left. [facility].

## 2021-04-12 MED ORDER — SITAGLIPTIN PHOSPHATE 100 MG PO TABS: 100.0000 mg | ORAL_TABLET | Freq: Every day | ORAL | 1 refills | Status: DC

## 2021-04-12 NOTE — Telephone Encounter (Signed)
Spoke with pt and informed her that the increased dose of 100 mg has been sent to pharmacy. Pt is aware that when she receives she will only take one pill instead of two.

## 2021-04-24 ENCOUNTER — Telehealth: Payer: Self-pay | Admitting: Internal Medicine

## 2021-04-24 NOTE — Telephone Encounter (Signed)
Spoke with pt and she stated that on Friday she received her new dose of Januvia 100 mg 1 tablet daily. Pt stated that once she started that medication her muscles in her left arm starting hurting. Pt stated no chest pain, no SOBr, no jaw pain, no neck pain, no shoulder or back pain. Pt states that she believes the 100 mg dose is to strong because she started cutting them in half(50 mg) and the muscle pain in left arm has gone away. Pt stated that she paid $125 for this medication.

## 2021-04-24 NOTE — Telephone Encounter (Signed)
Will follow up with patient about medication cost concerns and tolerability at our appointment next week

## 2021-04-24 NOTE — Telephone Encounter (Signed)
Patient is taking sitaGLIPtin (JANUVIA) 100 MG tablet and states that the dosage is too high for her and would like it lowered.

## 2021-05-01 ENCOUNTER — Ambulatory Visit: Payer: BC Managed Care – PPO | Admitting: Pharmacist

## 2021-05-01 DIAGNOSIS — E1165 Type 2 diabetes mellitus with hyperglycemia: Secondary | ICD-10-CM

## 2021-05-01 MED ORDER — SITAGLIPTIN PHOSPHATE 50 MG PO TABS
50.0000 mg | ORAL_TABLET | Freq: Every day | ORAL | 2 refills | Status: DC
Start: 2021-05-01 — End: 2021-06-16

## 2021-05-01 NOTE — Patient Instructions (Signed)
Ms. Onstott,   It was great talking to you today! Keep up the great work with exercise. If you notice more low blood sugars after supper, you may decrease the supper glipizide to 5 mg (1/2 a tablet).   We recommend you get the updated bivalent COVID-19 booster, at least 2 months after any prior doses. You may consider delaying a booster dose by 3 months from a prior episode of COVID-19 per the CDC.   You can find pharmacies that have this formulation in stock at [email or web address]   We also recommend everyone get the Shingrix (shingles) vaccine. Call our office if you want to set up an appointment for that.   Please have [facility] fax your diabetic eye exam results to our office [phone]).   Take care!  [name], PharmD  Visit Information  Following are the goals we discussed today:  Patient Goals/Self-Care Activities Over the next 90 days, patient will:  - take medications as prescribed check glucose twice daily, document, and provide at future appointments        Plan: Telephone follow up appointment with care management team member scheduled for:  3 months   [name], PharmD, [place], CPP Clinical Pharmacist [facility] at [facility] [phone]   Please call the care guide team at [phone] if you need to cancel or reschedule your appointment.   Patient verbalizes understanding of instructions provided today and agrees to view in MyChart.

## 2021-05-01 NOTE — Chronic Care Management (AMB) (Signed)
Chronic Care Management CCM Pharmacy Note  05/01/2021 Name:  DIELLA GILLINGHAM MRN:  [id number] DOB:  1960-08-03  Summary: - Glucose control improving with physical activity.   Recommendations/Changes made from today's visit: - Continue current regimen at this time. Continue increased physical activity - Discussed vaccination recommendations  Subjective: Katelyn Proctor is an 60 y.o. year old female who is a primary patient of Tullo, Aris Everts, MD.  The CCM team was consulted for assistance  with disease management and care coordination needs.    Engaged with patient by telephone for follow up visit for pharmacy case management and/or care coordination services.   Objective:  Medications Reviewed Today     Reviewed by De Hollingshead, RPH-CPP (Pharmacist) on 05/01/21 at 1354  Med List Status: <None>   Medication Order Taking? Sig Documenting Provider Last Dose Status Informant  aspirin EC 81 MG tablet [id number] Yes Take 1 tablet (81 mg total) by mouth daily. Crecencio Mc, MD Taking Active   atorvastatin (LIPITOR) 20 MG tablet [id number] Yes TAKE 1 TABLET DAILY Crecencio Mc, MD Taking Active   Blood Glucose Monitoring Suppl (Coggon) w/Device KIT [id number] Yes Use to check blood sugar up to TID. Crecencio Mc, MD Taking Active   Cholecalciferol (VITAMIN D) 50 MCG (2000 UT) CAPS [id number] Yes Take 1,000 Units by mouth daily.  [provider] Taking Active     Discontinued 08/07/11 1328 (Error)   glipiZIDE (GLUCOTROL) 10 MG tablet [id number] Yes TAKE 1 TABLET TWICE A DAY BEFORE MEALS Crecencio Mc, MD Taking Active   Lancets [facility] ULTRASOFT) lancets [id number] Yes Use as instructed to check sugars up to TID Crecencio Mc, MD Taking Active   losartan-hydrochlorothiazide [facility]) 100-25 MG tablet [id number] Yes TAKE 1 TABLET DAILY Crecencio Mc, MD Taking Active   meloxicam (MOBIC) 15 MG tablet [id number] Yes meloxicam 15 mg tablet  Take 1 tablet  every day by oral route. [provider] Taking Active            Med Note Heide Spark, JESSICA   Tue Mar 14, 2021  1:47 PM) Pt takes as needed  [facility] ULTRA test strip [id number] Yes USE TO TEST BLOOD SUGARS UP TO THREE TIMES DAILY Crecencio Mc, MD Taking Active   pioglitazone (ACTOS) 15 MG tablet [id number] Yes TAKE 1 TABLET DAILY Crecencio Mc, MD Taking Active   sitaGLIPtin (JANUVIA) 50 MG tablet [id number] Yes Take 1 tablet (50 mg total) by mouth daily. Crecencio Mc, MD Taking Active             Pertinent Labs:   Lab Results  Component Value Date   HGBA1C 7.6 (H) 03/13/2021   Lab Results  Component Value Date   CHOL 146 12/08/2020   HDL 53.40 12/08/2020   LDLCALC 83 12/08/2020   LDLDIRECT 160.0 09/18/2017   TRIG 49.0 12/08/2020   CHOLHDL 3 12/08/2020   Lab Results  Component Value Date   CREATININE 0.76 03/13/2021   BUN 26 (H) 03/13/2021   NA 137 03/13/2021   K 3.9 03/13/2021   CL 104 03/13/2021   CO2 26 03/13/2021    SDOH:  (Social Determinants of Health) assessments and interventions performed:    [place]  Review of patient past medical history, allergies, medications, health status, including review of consultants reports, laboratory and other test data, was performed as part of comprehensive evaluation and provision of chronic care management services.   Care Plan :  Medication Management  Updates made by De Hollingshead, RPH-CPP since 05/01/2021 12:00 AM     Problem: Diabetes, HTN, HLD      Long-Range Goal: Disease Progression Prevention   Start Date: 05/18/2020  Recent Progress: On track  Priority: High  Note:   Current Barriers:  Unable to achieve control of diabetes   Pharmacist Clinical Goal(s):  Over the next 90 days, patient will achieve control of diabetes as evidenced by A1c  through collaboration with PharmD and provider.   Interventions: 1:1 collaboration with Crecencio Mc, MD regarding development and update of  comprehensive plan of care as evidenced by provider attestation and co-signature Inter-disciplinary care team collaboration (see longitudinal plan of care) Comprehensive medication review performed; medication list updated in electronic medical record  Health Maintenance   Yearly diabetic eye exam: due - report she received last month, will call [facility] and have the results sent to [place] Yearly diabetic foot exam: up to date Urine microalbumin: up to date Yearly influenza vaccination: due - recommended she consider  Td/Tdap vaccination: up to date Pneumonia vaccination: up to date COVID vaccinations: due - encouraged to consider Shingrix vaccinations: due - encouraged to consider Colonoscopy: due - encouraged to discuss with PCP Mammogram: up to date  Diabetes: Uncontrolled but improving; current treatment: Januvia 50 mg daily, pioglitazone 15 mg daily, glipizide 10 mg BID Reports arm pain after increasing Januvia 100 mg daily. Has resumed 50 mg tablet.  Reports improvement in knee pain after wearing compression stockings, and reports she can walk a lot more now Hx metformin; notes that it caused significant diarrhea. Does not appear that she ever started metformin XR d/t cost, but refuses to try Is afraid of needles and is adamantly against injectable options Hx Rybelsus - GI upset w/ 7 mg dose Hx Jardiance - intolerance d/t itching, lack of energy, stomach problems Hx Januvia 50 mg - reported arm pain that resolved upon discontinuation Current glucose readings: reports afternoon readings 70-120s Current exercise: significant improvement in knee/leg pain with compression stockings.  Reports some readings of 70s after supper, days she walks. Advised to reduce supper glipizide to 5 mg if she has walked/exercised that day.  Reviewed goal A1c, goal fasting, and goal 2 hour post prandial glucose readings. Encouraged to continue to document and provide at next provider visit.  If future  elevations moving forward, consider trial of XR metformin.   Hypertension: Controlled per last clinic reading; current treatment: losartan/HCTZ 100/25 mg daily Current home readings: not been checking at home lately.  She does not have a blood pressure cuff, encouraged to purchase.  Recommend to continue current regimen at this time  Hyperlipidemia: Controlled per last lipid panel; current treatment: atorvastatin 20 mg daily   Current antiplatelet regimen: aspirin 81 mg daily Recommended to continue current regimen at this time   Patient Goals/Self-Care Activities Over the next 90 days, patient will:  - take medications as prescribed check glucose twice daily, document, and provide at future appointments      Plan: Telephone follow up appointment with care management team member scheduled for:  3 months  [name] Darnelle Maffucci, PharmD, [place], [place] Clinical Pharmacist [employer] at [employer] [phone]

## 2021-05-25 ENCOUNTER — Ambulatory Visit: Payer: BC Managed Care – PPO | Admitting: Internal Medicine

## 2021-06-09 ENCOUNTER — Ambulatory Visit: Payer: BC Managed Care – PPO | Admitting: Internal Medicine

## 2021-06-13 ENCOUNTER — Other Ambulatory Visit (INDEPENDENT_AMBULATORY_CARE_PROVIDER_SITE_OTHER): Payer: BC Managed Care – PPO

## 2021-06-13 ENCOUNTER — Other Ambulatory Visit: Payer: Self-pay

## 2021-06-13 DIAGNOSIS — R748 Abnormal levels of other serum enzymes: Secondary | ICD-10-CM

## 2021-06-13 DIAGNOSIS — E669 Obesity, unspecified: Secondary | ICD-10-CM

## 2021-06-13 DIAGNOSIS — E1169 Type 2 diabetes mellitus with other specified complication: Secondary | ICD-10-CM

## 2021-06-13 DIAGNOSIS — I152 Hypertension secondary to endocrine disorders: Secondary | ICD-10-CM

## 2021-06-13 DIAGNOSIS — E1159 Type 2 diabetes mellitus with other circulatory complications: Secondary | ICD-10-CM | POA: Diagnosis not present

## 2021-06-13 LAB — COMPREHENSIVE METABOLIC PANEL
ALT: 41 U/L — ABNORMAL HIGH (ref 0–35)
AST: 32 U/L (ref 0–37)
Albumin: 4 g/dL (ref 3.5–5.2)
Alkaline Phosphatase: 111 U/L (ref 39–117)
BUN: 14 mg/dL (ref 6–23)
CO2: 29 mEq/L (ref 19–32)
Calcium: 9.9 mg/dL (ref 8.4–10.5)
Chloride: 103 mEq/L (ref 96–112)
Creatinine, Ser: 0.76 mg/dL (ref 0.40–1.20)
GFR: 84.31 mL/min (ref >60.00)
Glucose, Bld: 138 mg/dL — ABNORMAL HIGH (ref 70–99)
Potassium: 4.2 mEq/L (ref 3.5–5.1)
Sodium: 139 mEq/L (ref 135–145)
Total Bilirubin: 0.6 mg/dL (ref 0.2–1.2)
Total Protein: 7.1 g/dL (ref 6.0–8.3)

## 2021-06-13 LAB — HEMOGLOBIN A1C: Hgb A1c MFr Bld: 7.4 % — ABNORMAL HIGH (ref 4.6–6.5)

## 2021-06-13 LAB — LIPID PANEL
Cholesterol: 148 mg/dL (ref 0–200)
HDL: 53.3 mg/dL (ref >39.00)
LDL Cholesterol: 85 mg/dL (ref 0–99)
NonHDL: 94.7
Total CHOL/HDL Ratio: 3
Triglycerides: 49 mg/dL (ref 0.0–149.0)
VLDL: 9.8 mg/dL (ref 0.0–40.0)

## 2021-06-13 LAB — IBC + FERRITIN
Ferritin: 73.8 ng/mL (ref 10.0–291.0)
Iron: 74 ug/dL (ref 42–145)
Saturation Ratios: 17.1 % — ABNORMAL LOW (ref 20.0–50.0)
TIBC: 432.6 ug/dL (ref 250.0–450.0)
Transferrin: 309 mg/dL (ref 212.0–360.0)

## 2021-06-14 LAB — HEPATITIS B SURFACE ANTIBODY,QUALITATIVE: Hep B S Ab: NONREACTIVE

## 2021-06-14 LAB — HEPATITIS C ANTIBODY
Hepatitis C Ab: NONREACTIVE
SIGNAL TO CUT-OFF: 0.22 (ref <1.00)

## 2021-06-14 LAB — HEPATITIS B CORE ANTIBODY, TOTAL: Hep B Core Total Ab: NONREACTIVE

## 2021-06-14 LAB — HEPATITIS B SURFACE ANTIGEN: Hepatitis B Surface Ag: NONREACTIVE

## 2021-06-16 ENCOUNTER — Encounter: Payer: Self-pay | Admitting: Internal Medicine

## 2021-06-16 ENCOUNTER — Other Ambulatory Visit: Payer: Self-pay

## 2021-06-16 ENCOUNTER — Ambulatory Visit (INDEPENDENT_AMBULATORY_CARE_PROVIDER_SITE_OTHER): Payer: BC Managed Care – PPO | Admitting: Internal Medicine

## 2021-06-16 DIAGNOSIS — E1165 Type 2 diabetes mellitus with hyperglycemia: Secondary | ICD-10-CM | POA: Diagnosis not present

## 2021-06-16 DIAGNOSIS — E1159 Type 2 diabetes mellitus with other circulatory complications: Secondary | ICD-10-CM

## 2021-06-16 DIAGNOSIS — E7800 Pure hypercholesterolemia, unspecified: Secondary | ICD-10-CM | POA: Diagnosis not present

## 2021-06-16 DIAGNOSIS — I10 Essential (primary) hypertension: Secondary | ICD-10-CM | POA: Diagnosis not present

## 2021-06-16 DIAGNOSIS — E669 Obesity, unspecified: Secondary | ICD-10-CM

## 2021-06-16 DIAGNOSIS — R748 Abnormal levels of other serum enzymes: Secondary | ICD-10-CM

## 2021-06-16 DIAGNOSIS — E1169 Type 2 diabetes mellitus with other specified complication: Secondary | ICD-10-CM

## 2021-06-16 DIAGNOSIS — Z789 Other specified health status: Secondary | ICD-10-CM | POA: Diagnosis not present

## 2021-06-16 MED ORDER — SITAGLIPTIN PHOSPHATE 100 MG PO TABS: 100.0000 mg | ORAL_TABLET | Freq: Every day | ORAL | 1 refills | Status: DC

## 2021-06-16 MED ORDER — ONETOUCH ULTRASOFT LANCETS MISC: 3 refills | Status: DC

## 2021-06-16 NOTE — Assessment & Plan Note (Signed)
Improving.  Viral hepatitis ruled out.  Ultrasound normal 2020

## 2021-06-16 NOTE — Assessment & Plan Note (Signed)
LDL and triglycerides are at goal on current medications. He has no side effects and liver enzymes are normalizing .  No changes today   Lab Results  Component Value Date   CHOL 148 06/13/2021   HDL 53.30 06/13/2021   LDLCALC 85 06/13/2021   LDLDIRECT 160.0 09/18/2017   TRIG 49.0 06/13/2021   CHOLHDL 3 06/13/2021   Lab Results  Component Value Date   ALT 41 (H) 06/13/2021   AST 32 06/13/2021   ALKPHOS 111 06/13/2021   BILITOT 0.6 06/13/2021

## 2021-06-16 NOTE — Assessment & Plan Note (Signed)
Tolerating atorvastatin and LDL is at goal < 100

## 2021-06-16 NOTE — Assessment & Plan Note (Signed)
Well controlled on current regimen. Renal function stable, no changes today. 

## 2021-06-16 NOTE — Progress Notes (Signed)
Subjective:  Patient ID: Katelyn Proctor, female    DOB: [birth year]  Age: 61 y.o. MRN: [id number]  CC: Diagnoses of Pure hypercholesterolemia, Uncontrolled type 2 diabetes mellitus with hyperglycemia ([place]), Essential hypertension, Statin intolerance, Elevated liver enzymes, and Obesity, diabetes, and hypertension syndrome ([place]) were pertinent to this visit.   This visit occurred during the SARS-CoV-2 public health emergency.  Safety protocols were in place, including screening questions prior to the visit, additional usage of staff PPE, and extensive cleaning of exam room while observing appropriate contact time as indicated for disinfecting solutions.    HPI VELINDA WROBEL presents for follow up on diabetes, obesity and hypertension  Chief Complaint  Patient presents with   Follow-up    3 month follow up on diabetes and discuss lab results    T2DM:  She  feels generally well,  But is not  exercising regularly due to resolving foot pain and working 3rd shift.  Plans to return to walking regularly  and "finally going to start taking care of myself" as she is retiring in one month .  She os Checking  blood sugars  usually twice daily at variable times,  BS have been under 150 fasting and < 150 post prandially.  Denies any recent hypoglyemic events. She is taking her medications as directed. Following a carbohydrate modified diet 6 days per week. Denies numbness, burning and tingling of extremities. Appetite is good.   Has noticed that since her Januvia was increased to 100 mg daily (in November 2022)  she has an unpleasant tingling  sensation in left arm that occurs once she is home from work and lasts several hours.  Occurs  every day.  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  Readings have been for the most part < 140/80 at rest . Patient is following a reduce salt diet most  days and is taking medications as prescribed   Outpatient Medications Prior to Visit  Medication Sig Dispense Refill   aspirin EC 81 MG tablet Take 1 tablet (81 mg total) by mouth daily. 30 tablet 11   atorvastatin (LIPITOR) 20 MG tablet TAKE 1 TABLET DAILY 90 tablet 3   Blood Glucose Monitoring Suppl (ONETOUCH VERIO FLEX SYSTEM) w/Device KIT Use to check blood sugar up to TID. 1 kit 3   Cholecalciferol (VITAMIN D) 50 MCG (2000 UT) CAPS Take 1,000 Units by mouth daily.      glipiZIDE (GLUCOTROL) 10 MG tablet TAKE 1 TABLET TWICE A DAY BEFORE MEALS 180 tablet 3   losartan-hydrochlorothiazide (HYZAAR) 100-25 MG tablet TAKE 1 TABLET DAILY 90 tablet 3   meloxicam (MOBIC) 15 MG tablet meloxicam 15 mg tablet  Take 1 tablet every day by oral route.     ONETOUCH ULTRA test strip USE TO TEST BLOOD SUGARS UP TO THREE TIMES DAILY 300 strip 1   pioglitazone (ACTOS) 15 MG tablet TAKE 1 TABLET DAILY 90 tablet 3   Lancets (ONETOUCH ULTRASOFT) lancets Use as instructed to check sugars up to TID 300 each 3   sitaGLIPtin (JANUVIA) 50 MG tablet Take 1 tablet (50 mg total) by mouth daily. 90 tablet 2   No facility-administered medications prior to visit.    Review of Systems;  Patient denies headache, fevers, malaise, unintentional weight loss, skin rash, eye pain, sinus congestion and sinus pain, sore throat, dysphagia,  hemoptysis , cough, dyspnea, wheezing,  chest pain, palpitations, orthopnea, edema, abdominal pain, nausea, melena, diarrhea, constipation, flank pain, dysuria, hematuria, urinary  Frequency, nocturia, numbness, tingling, seizures,  Focal weakness, Loss of consciousness,  Tremor, insomnia, depression, anxiety, and suicidal ideation.      Objective:  BP 138/72 (BP Location: Left Arm, Patient Position: Sitting, Cuff Size: Large)    Pulse 86    Temp 98.1 F (36.7 C) (Oral)    Ht '5\' 2"'  (1.575 m)    Wt 214 lb 6.4 oz (97.3 kg)    SpO2 97%    BMI 39.21 kg/m   BP Readings from Last 3 Encounters:   06/16/21 138/72  03/14/21 126/64  01/19/21 (!) 150/89    Wt Readings from Last 3 Encounters:  06/16/21 214 lb 6.4 oz (97.3 kg)  03/14/21 212 lb 9.6 oz (96.4 kg)  01/19/21 210 lb (95.3 kg)    General appearance: alert, cooperative and appears stated age Ears: normal TM's and external ear canals both ears Throat: lips, mucosa, and tongue normal; teeth and gums normal Neck: no adenopathy, no carotid bruit, supple, symmetrical, trachea midline and thyroid not enlarged, symmetric, no tenderness/mass/nodules Back: symmetric, no curvature. ROM normal. No CVA tenderness. Lungs: clear to auscultation bilaterally Heart: regular rate and rhythm, S1, S2 normal, no murmur, click, rub or gallop Abdomen: soft, non-tender; bowel sounds normal; no masses,  no organomegaly Pulses: 2+ and symmetric Skin: Skin color, texture, turgor normal. No rashes or lesions Lymph nodes: Cervical, supraclavicular, and axillary nodes normal.  Lab Results  Component Value Date   HGBA1C 7.4 (H) 06/13/2021   HGBA1C 7.6 (H) 03/13/2021   HGBA1C 7.1 (H) 12/08/2020    Lab Results  Component Value Date   CREATININE 0.76 06/13/2021   CREATININE 0.76 03/13/2021   CREATININE 0.69 12/08/2020    Lab Results  Component Value Date   WBC 5.2 04/01/2020   HGB 14.6 04/01/2020   HCT 42.5 04/01/2020   PLT 250.0 04/01/2020   GLUCOSE 138 (H) 06/13/2021   CHOL 148 06/13/2021   TRIG 49.0 06/13/2021   HDL 53.30 06/13/2021   LDLDIRECT 160.0 09/18/2017   LDLCALC 85 06/13/2021   ALT 41 (H) 06/13/2021   AST 32 06/13/2021   NA 139 06/13/2021   K 4.2 06/13/2021   CL 103 06/13/2021   CREATININE 0.76 06/13/2021   BUN 14 06/13/2021   CO2 29 06/13/2021   TSH 1.51 04/28/2019   HGBA1C 7.4 (H) 06/13/2021   MICROALBUR 3.6 (H) 03/17/2020    MM 3D SCREEN BREAST BILATERAL  Result Date: 03/08/2021 CLINICAL DATA:  Screening. EXAM: DIGITAL SCREENING BILATERAL MAMMOGRAM WITH TOMOSYNTHESIS AND CAD TECHNIQUE: Bilateral screening  digital craniocaudal and mediolateral oblique mammograms were obtained. Bilateral screening digital breast tomosynthesis was performed. The images were evaluated with computer-aided detection. COMPARISON:  Previous exam(s). ACR Breast Density Category b: There are scattered areas of fibroglandular density. FINDINGS: There are no findings suspicious for malignancy. IMPRESSION: No mammographic evidence of malignancy. A result letter of this screening mammogram will be mailed directly to the patient. RECOMMENDATION: Screening mammogram in one year. (Code:SM-B-01Y) BI-RADS CATEGORY  1: Negative. Electronically Signed   By: Kristopher Oppenheim M.D.   On: 03/08/2021 09:53   Assessment & Plan:   Problem List Items Addressed This Visit     Hyperlipidemia    LDL and triglycerides are at goal on current medications. He has no side effects and liver enzymes are normalizing .  No changes today   Lab Results  Component Value Date   CHOL  148 06/13/2021   HDL 53.30 06/13/2021   LDLCALC 85 06/13/2021   LDLDIRECT 160.0 09/18/2017   TRIG 49.0 06/13/2021   CHOLHDL 3 06/13/2021   Lab Results  Component Value Date   ALT 41 (H) 06/13/2021   AST 32 06/13/2021   ALKPHOS 111 06/13/2021   BILITOT 0.6 06/13/2021         Essential hypertension    Well controlled on current regimen. Renal function stable, no changes today.      Obesity, diabetes, and hypertension syndrome (HCC)    Improved a1c (slightly) with  Increasing Januvia to 100 mg daily. Continue actos, statin,  And ARB.  Return in 6 months       Relevant Medications   sitaGLIPtin (JANUVIA) 100 MG tablet   Statin intolerance    Tolerating atorvastatin and LDL is at goal < 100      Elevated liver enzymes    Improving.  Viral hepatitis ruled out.  Ultrasound normal 2020       Other Visit Diagnoses     Uncontrolled type 2 diabetes mellitus with hyperglycemia (HCC)       Relevant Medications   Lancets (ONETOUCH ULTRASOFT) lancets    sitaGLIPtin (JANUVIA) 100 MG tablet   Other Relevant Orders   Hemoglobin A1c   Comprehensive metabolic panel   Lipid panel   Microalbumin / creatinine urine ratio       I spent 30 minutes dedicated to the care of this patient on the date of this encounter to include pre-visit review of patient's medical history,  most recent imaging studies, Face-to-face time with the patient , and post visit ordering of testing and therapeutics.    Follow-up: Return in about 6 months (around 12/14/2021) for follow up diabetes.   Crecencio Mc, MD

## 2021-06-16 NOTE — Assessment & Plan Note (Addendum)
Improved a1c (slightly) with  Increasing Januvia to 100 mg daily. Continue actos, statin,  And ARB.  Return in 6 months

## 2021-06-19 ENCOUNTER — Telehealth: Payer: Self-pay | Admitting: Internal Medicine

## 2021-06-19 DIAGNOSIS — E1165 Type 2 diabetes mellitus with hyperglycemia: Secondary | ICD-10-CM

## 2021-06-19 NOTE — Telephone Encounter (Signed)
Patient called and said she saw Dr Derrel Nip on Friday, 06/16/2021. Her Lancets (ONETOUCH ULTRASOFT) lancets were sent to her mail order pharmacy. They need to go to [facility] in [place].

## 2021-06-20 MED ORDER — ONETOUCH ULTRASOFT LANCETS MISC: 3 refills | Status: AC

## 2021-06-20 NOTE — Addendum Note (Signed)
Addended by: Sandy Salaam on: 06/20/2021 08:57 AM   Modules accepted: Orders

## 2021-06-20 NOTE — Telephone Encounter (Signed)
Resent to [facility]

## 2021-07-18 ENCOUNTER — Ambulatory Visit: Payer: Self-pay | Admitting: Pharmacist

## 2021-07-18 NOTE — Chronic Care Management (AMB) (Signed)
?  Chronic Care Management  ? ?Note ? ?07/18/2021 ?Name: ALAA MULLALLY MRN: [id number] DOB: 08-Apr-1961 ? ? ? ?Closing pharmacy CCM case at this time. Patient has clinic contact information for future questions or concerns.  ? ?[name], PharmD, [place], CPP ?Clinical Pharmacist ?Nature conservation officer at [employer] ?[phone] ? ?

## 2021-10-02 ENCOUNTER — Other Ambulatory Visit: Payer: Self-pay | Admitting: Internal Medicine

## 2021-10-02 DIAGNOSIS — E7800 Pure hypercholesterolemia, unspecified: Secondary | ICD-10-CM

## 2021-11-15 ENCOUNTER — Other Ambulatory Visit: Payer: Self-pay | Admitting: Internal Medicine

## 2021-11-15 DIAGNOSIS — E1165 Type 2 diabetes mellitus with hyperglycemia: Secondary | ICD-10-CM

## 2021-12-08 ENCOUNTER — Other Ambulatory Visit: Payer: BC Managed Care – PPO

## 2021-12-12 ENCOUNTER — Ambulatory Visit: Payer: BC Managed Care – PPO | Admitting: Internal Medicine

## 2021-12-25 ENCOUNTER — Other Ambulatory Visit: Payer: Self-pay | Admitting: Internal Medicine

## 2021-12-25 DIAGNOSIS — E1165 Type 2 diabetes mellitus with hyperglycemia: Secondary | ICD-10-CM

## 2022-01-25 LAB — HM DIABETES EYE EXAM

## 2022-01-29 ENCOUNTER — Other Ambulatory Visit: Payer: Self-pay | Admitting: Internal Medicine

## 2022-01-29 DIAGNOSIS — Z1231 Encounter for screening mammogram for malignant neoplasm of breast: Secondary | ICD-10-CM

## 2022-03-12 ENCOUNTER — Ambulatory Visit
Admission: RE | Admit: 2022-03-12 | Discharge: 2022-03-12 | Disposition: A | Discharge status: Home / Self Care | Payer: BC Managed Care – PPO | Source: Ambulatory Visit | Attending: Internal Medicine | Admitting: Internal Medicine

## 2022-03-12 DIAGNOSIS — Z1231 Encounter for screening mammogram for malignant neoplasm of breast: Secondary | ICD-10-CM | POA: Insufficient documentation

## 2022-03-13 ENCOUNTER — Other Ambulatory Visit: Payer: Self-pay | Admitting: Internal Medicine

## 2022-03-13 DIAGNOSIS — I10 Essential (primary) hypertension: Secondary | ICD-10-CM

## 2022-03-22 ENCOUNTER — Other Ambulatory Visit (INDEPENDENT_AMBULATORY_CARE_PROVIDER_SITE_OTHER): Payer: BC Managed Care – PPO

## 2022-03-22 DIAGNOSIS — E1165 Type 2 diabetes mellitus with hyperglycemia: Secondary | ICD-10-CM

## 2022-03-22 LAB — COMPREHENSIVE METABOLIC PANEL
ALT: 17 U/L (ref 0–35)
AST: 17 U/L (ref 0–37)
Albumin: 3.8 g/dL (ref 3.5–5.2)
Alkaline Phosphatase: 95 U/L (ref 39–117)
BUN: 16 mg/dL (ref 6–23)
CO2: 29 mEq/L (ref 19–32)
Calcium: 9.2 mg/dL (ref 8.4–10.5)
Chloride: 105 mEq/L (ref 96–112)
Creatinine, Ser: 0.61 mg/dL (ref 0.40–1.20)
GFR: 95.68 mL/min (ref >60.00)
Glucose, Bld: 131 mg/dL — ABNORMAL HIGH (ref 70–99)
Potassium: 4 mEq/L (ref 3.5–5.1)
Sodium: 139 mEq/L (ref 135–145)
Total Bilirubin: 0.6 mg/dL (ref 0.2–1.2)
Total Protein: 6.7 g/dL (ref 6.0–8.3)

## 2022-03-22 LAB — LIPID PANEL
Cholesterol: 148 mg/dL (ref 0–200)
HDL: 53.1 mg/dL (ref >39.00)
LDL Cholesterol: 85 mg/dL (ref 0–99)
NonHDL: 94.5
Total CHOL/HDL Ratio: 3
Triglycerides: 50 mg/dL (ref 0.0–149.0)
VLDL: 10 mg/dL (ref 0.0–40.0)

## 2022-03-22 LAB — HEMOGLOBIN A1C: Hgb A1c MFr Bld: 6.9 % — ABNORMAL HIGH (ref 4.6–6.5)

## 2022-03-22 LAB — MICROALBUMIN / CREATININE URINE RATIO
Creatinine,U: 135.4 mg/dL
Microalb Creat Ratio: 0.5 mg/g (ref 0.0–30.0)
Microalb, Ur: <0.7 mg/dL (ref 0.0–1.9)

## 2022-04-10 ENCOUNTER — Ambulatory Visit: Payer: BC Managed Care – PPO | Admitting: Internal Medicine

## 2022-06-28 ENCOUNTER — Ambulatory Visit: Payer: BC Managed Care – PPO | Admitting: Internal Medicine

## 2022-06-28 ENCOUNTER — Encounter: Payer: Self-pay | Admitting: Internal Medicine

## 2022-06-28 ENCOUNTER — Other Ambulatory Visit (HOSPITAL_COMMUNITY)
Admission: RE | Admit: 2022-06-28 | Discharge: 2022-06-28 | Disposition: A | Discharge status: Home / Self Care | Payer: BC Managed Care – PPO | Source: Ambulatory Visit | Attending: Internal Medicine | Admitting: Internal Medicine

## 2022-06-28 VITALS — BP 122/66 | HR 75 | Temp 98.1°F | Ht 62.0 in | Wt 231.2 lb

## 2022-06-28 DIAGNOSIS — Z124 Encounter for screening for malignant neoplasm of cervix: Secondary | ICD-10-CM | POA: Diagnosis not present

## 2022-06-28 DIAGNOSIS — E1169 Type 2 diabetes mellitus with other specified complication: Secondary | ICD-10-CM

## 2022-06-28 DIAGNOSIS — E7800 Pure hypercholesterolemia, unspecified: Secondary | ICD-10-CM | POA: Diagnosis not present

## 2022-06-28 DIAGNOSIS — B3731 Acute candidiasis of vulva and vagina: Secondary | ICD-10-CM

## 2022-06-28 DIAGNOSIS — Z0000 Encounter for general adult medical examination without abnormal findings: Secondary | ICD-10-CM | POA: Diagnosis not present

## 2022-06-28 DIAGNOSIS — I10 Essential (primary) hypertension: Secondary | ICD-10-CM | POA: Diagnosis not present

## 2022-06-28 DIAGNOSIS — R195 Other fecal abnormalities: Secondary | ICD-10-CM

## 2022-06-28 DIAGNOSIS — N952 Postmenopausal atrophic vaginitis: Secondary | ICD-10-CM

## 2022-06-28 DIAGNOSIS — Z720 Tobacco use: Secondary | ICD-10-CM

## 2022-06-28 DIAGNOSIS — Z1211 Encounter for screening for malignant neoplasm of colon: Secondary | ICD-10-CM

## 2022-06-28 DIAGNOSIS — I152 Hypertension secondary to endocrine disorders: Secondary | ICD-10-CM

## 2022-06-28 DIAGNOSIS — R5383 Other fatigue: Secondary | ICD-10-CM

## 2022-06-28 DIAGNOSIS — E1165 Type 2 diabetes mellitus with hyperglycemia: Secondary | ICD-10-CM | POA: Diagnosis not present

## 2022-06-28 DIAGNOSIS — E1159 Type 2 diabetes mellitus with other circulatory complications: Secondary | ICD-10-CM

## 2022-06-28 DIAGNOSIS — E785 Hyperlipidemia, unspecified: Secondary | ICD-10-CM

## 2022-06-28 DIAGNOSIS — E669 Obesity, unspecified: Secondary | ICD-10-CM

## 2022-06-28 LAB — COMPREHENSIVE METABOLIC PANEL
ALT: 22 U/L (ref 0–35)
AST: 19 U/L (ref 0–37)
Albumin: 3.9 g/dL (ref 3.5–5.2)
Alkaline Phosphatase: 104 U/L (ref 39–117)
BUN: 14 mg/dL (ref 6–23)
CO2: 27 mEq/L (ref 19–32)
Calcium: 9.9 mg/dL (ref 8.4–10.5)
Chloride: 103 mEq/L (ref 96–112)
Creatinine, Ser: 0.78 mg/dL (ref 0.40–1.20)
GFR: 81.13 mL/min (ref >60.00)
Glucose, Bld: 154 mg/dL — ABNORMAL HIGH (ref 70–99)
Potassium: 4 mEq/L (ref 3.5–5.1)
Sodium: 139 mEq/L (ref 135–145)
Total Bilirubin: 0.6 mg/dL (ref 0.2–1.2)
Total Protein: 7.4 g/dL (ref 6.0–8.3)

## 2022-06-28 LAB — LIPID PANEL
Cholesterol: 169 mg/dL (ref 0–200)
HDL: 48.1 mg/dL (ref >39.00)
LDL Cholesterol: 110 mg/dL — ABNORMAL HIGH (ref 0–99)
NonHDL: 120.44
Total CHOL/HDL Ratio: 4
Triglycerides: 54 mg/dL (ref 0.0–149.0)
VLDL: 10.8 mg/dL (ref 0.0–40.0)

## 2022-06-28 LAB — CBC WITH DIFFERENTIAL/PLATELET
Basophils Absolute: 0 10*3/uL (ref 0.0–0.1)
Basophils Relative: 0.5 % (ref 0.0–3.0)
Eosinophils Absolute: 0 10*3/uL (ref 0.0–0.7)
Eosinophils Relative: 0.4 % (ref 0.0–5.0)
HCT: 42.9 % (ref 36.0–46.0)
Hemoglobin: 14.5 g/dL (ref 12.0–15.0)
Lymphocytes Relative: 21 % (ref 12.0–46.0)
Lymphs Abs: 1.3 10*3/uL (ref 0.7–4.0)
MCHC: 33.8 g/dL (ref 30.0–36.0)
MCV: 93 fl (ref 78.0–100.0)
Monocytes Absolute: 0.4 10*3/uL (ref 0.1–1.0)
Monocytes Relative: 6.7 % (ref 3.0–12.0)
Neutro Abs: 4.5 10*3/uL (ref 1.4–7.7)
Neutrophils Relative %: 71.4 % (ref 43.0–77.0)
Platelets: 260 10*3/uL (ref 150.0–400.0)
RBC: 4.61 Mil/uL (ref 3.87–5.11)
RDW: 14.1 % (ref 11.5–15.5)
WBC: 6.3 10*3/uL (ref 4.0–10.5)

## 2022-06-28 LAB — LDL CHOLESTEROL, DIRECT: Direct LDL: 104 mg/dL

## 2022-06-28 LAB — TSH: TSH: 1.18 u[IU]/mL (ref 0.35–5.50)

## 2022-06-28 LAB — HEMOGLOBIN A1C: Hgb A1c MFr Bld: 7.3 % — ABNORMAL HIGH (ref 4.6–6.5)

## 2022-06-28 MED ORDER — SITAGLIPTIN PHOSPHATE 100 MG PO TABS: 100.0000 mg | ORAL_TABLET | Freq: Every day | ORAL | 2 refills | Status: DC

## 2022-06-28 MED ORDER — ESTRADIOL 10 MCG VA TABS: ORAL_TABLET | VAGINAL | 0 refills | Status: DC

## 2022-06-28 NOTE — Patient Instructions (Addendum)
I have prescribed estrogen vaginal suppositories for you to start using IF YOUR RELATIONSHIP BECOMES SEXUAL.  THIS IS HOW YOU USE IT:  Insert 1 tablet into your vagina using the applicator in the box.  Do this at at  bedtime daily for 2 weeks,  Then after two weeks , reduce use to twice weekly thereafter     I will initiate the order for your colon cancer screening  Test, the one called  Cologuard.  It will be delivered to your house, and you will send off a stool sample in the envelope it provides.   Check with your insurance to make sure it is covered as a screening test   CONGRATULATIONS ON MAKING EXCELLENT LIFESTYLE CHANGES!    I strongly recommend the lung cancer screening referral I mentioned in your visit today.  I will make the referral to the pulmonary clinic that does this

## 2022-06-28 NOTE — Progress Notes (Signed)
Patient ID: Katelyn Proctor, female    DOB: 03-Jun-1960  Age: 62 y.o. MRN: [id number]  The patient is here for annual preventive examination and management of other chronic and acute problems.   The risk factors are reflected in the social history.   The roster of all physicians providing medical care to patient - is listed in the Snapshot section of the chart.   Activities of daily living:  The patient is 100% independent in all ADLs: dressing, toileting, feeding as well as independent mobility   Home safety : The patient has smoke detectors in the home. They wear seatbelts.  There are no unsecured firearms at home. There is no violence in the home.    There is no risks for hepatitis, STDs or HIV. There is no   history of blood transfusion. They have no travel history to infectious disease endemic areas of the world.   The patient has seen their dentist in the last six month. They have seen their eye doctor in the last year. The patinet  denies slight hearing difficulty with regard to whispered voices and some television programs.  They have deferred audiologic testing in the last year.  They do not  have excessive sun exposure. Discussed the need for sun protection: hats, long sleeves and use of sunscreen if there is significant sun exposure.    Diet: the importance of a healthy diet is discussed. They do have a healthy diet. She is walking on a treadmill for an hour daily for the past 2 months  . Denies dyspnea and chest pain    The benefits of regular aerobic exercise were discussed. The patient  exercises  3 to 5 days per week  for  60 minutes.    Depression screen: there are no signs or vegative symptoms of depression- irritability, change in appetite, anhedonia, sadness/tearfullness.   The following portions of the patient's history were reviewed and updated as appropriate: allergies, current medications, past family history, past medical history,  past surgical history, past social history   and problem list.   Visual acuity was not assessed per patient preference since the patient has regular follow up with an  ophthalmologist. Hearing and body mass index were assessed and reviewed.    During the course of the visit the patient was educated and counseled about appropriate screening and preventive services including : fall prevention , diabetes screening, nutrition counseling, colorectal cancer screening, and recommended immunizations.    Chief Complaint:  1) tobacco abuse :  45 years.,has recently reduced use to 2-3 cigs/day   Review of Symptoms  Patient denies headache, fevers, malaise, unintentional weight loss, skin rash, eye pain, sinus congestion and sinus pain, sore throat, dysphagia,  hemoptysis , cough, dyspnea, wheezing, chest pain, palpitations, orthopnea, edema, abdominal pain, nausea, melena, diarrhea, constipation, flank pain, dysuria, hematuria, urinary  Frequency, nocturia, numbness, tingling, seizures,  Focal weakness, Loss of consciousness,  Tremor, insomnia, depression, anxiety, and suicidal ideation.    Physical Exam:  BP 122/66   Pulse 75   Temp 98.1 F (36.7 C) (Oral)   Ht 5' 2"$  (1.575 m)   Wt 231 lb 3.2 oz (104.9 kg)   SpO2 98%   BMI 42.29 kg/m    Physical Exam  Assessment and Plan: Encounter for preventive health examination Assessment & Plan: age appropriate education and counseling updated, referrals for preventative services and immunizations addressed, dietary and smoking counseling addressed, most recent labs reviewed.  I have personally reviewed and have noted:  1) the patient's medical and social history 2) The pt's use of alcohol, tobacco, and illicit drugs 3) The patient's current medications and supplements 4) Functional ability including ADL's, fall risk, home safety risk, hearing and visual impairment 5) Diet and physical activities 6) Evidence for depression or mood disorder 7) The patient's height, weight, and BMI have been  recorded in the chart  I have made referrals, and provided counseling and education based on review of the above    Uncontrolled type 2 diabetes mellitus with hyperglycemia (HCC) -     SITagliptin Phosphate; Take 1 tablet (100 mg total) by mouth daily.  Dispense: 90 tablet; Refill: 2 -     Hemoglobin A1c -     Comprehensive metabolic panel  Essential hypertension Assessment & Plan: Well controlled on llosartan/hct.  Renal function stable, no changes today.  Lab Results  Component Value Date   CREATININE 0.78 06/28/2022   Lab Results  Component Value Date   NA 139 06/28/2022   K 4.0 06/28/2022   CL 103 06/28/2022   CO2 27 06/28/2022     Orders: -     Comprehensive metabolic panel  Pure hypercholesterolemia -     Lipid panel -     LDL cholesterol, direct  Other fatigue -     TSH -     CBC with Differential/Platelet  Cervical cancer screening -     Cytology - PAP  Colon cancer screening -     Cologuard  Tobacco abuse Assessment & Plan: he patient was asked about her current tobacco use, congratulate  her reduction in use,  counselled on the long term risks of tobacco use, and was encouraged to start luncg cancer screening .  Referral t pulmonary clinic made.   Orders: -     Ambulatory referral to Pulmonology  Hyperlipidemia associated with type 2 diabetes mellitus ([place]) Assessment & Plan: Currently well-controlled on current medications .  hemoglobin A1c is near goal of 7.0 on januvia, glipizide, and Actos. . Patient is  up to date on her  annual eye exam and foot exam is normal today. Patient has resolved  microalbuminuria on losartan .  She is  tolerating statin therapy for CAD risk reduction ;   LDL and triglycerides are at goal on atorvastatin 20 mg . Will remind her to   She has  no side effects and liver enzymes are normal .  No changes today    Lab Results  Component Value Date   HGBA1C 7.3 (H) 06/28/2022   Lab Results  Component Value Date    MICROALBUR <0.7 03/22/2022   MICROALBUR 3.6 (H) 03/17/2020        Lab Results  Component Value Date   CHOL 169 06/28/2022   HDL 48.10 06/28/2022   LDLCALC 110 (H) 06/28/2022   LDLDIRECT 104.0 06/28/2022   TRIG 54.0 06/28/2022   CHOLHDL 4 06/28/2022   Lab Results  Component Value Date   ALT 22 06/28/2022   AST 19 06/28/2022   ALKPHOS 104 06/28/2022   BILITOT 0.6 06/28/2022      Obesity, morbid, BMI 40.0-49.9 ([place]) Assessment & Plan: I have congratulated her in the lifestyle changes she is making and encouraged  Continued weight loss with goal of 4 lbs per month over the next 6 months using a low glycemic index diet and regular exercise a minimum of 5 days per week.     Pap smear for cervical cancer screening Assessment & Plan: Done today with considerable  difficulty due to vaginal stenosis .    Screening for colon cancer Assessment & Plan: Overdue for colon ca screening by patient choice due to fear of COVID.  Repeated the need for screening ; she is contemplative of the procedure and the risk of exposure.  Cologaurd ordered     Atrophic vaginitis Assessment & Plan: Estrace vaginal tablets prescribed    Obesity, diabetes, and hypertension syndrome (HCC) Assessment & Plan: Adding rybelsus to current regimen of actos, januvia and glipizide since A1c has risen   Lab Results  Component Value Date   HGBA1C 7.3 (H) 06/28/2022      Other orders -     Estradiol; Insert one tablet vaginally each night for 14 days,  then twice weekly thereafter  Dispense: 18 tablet; Refill: 0 -     Rybelsus; Take 1 tablet (3 mg total) by mouth daily.  Dispense: 30 tablet; Refill: 0    Return in about 2 weeks (around 07/12/2022) for follow up diabetes.  Crecencio Mc, MD

## 2022-06-30 DIAGNOSIS — N952 Postmenopausal atrophic vaginitis: Secondary | ICD-10-CM | POA: Insufficient documentation

## 2022-06-30 MED ORDER — RYBELSUS 3 MG PO TABS: 3.0000 mg | ORAL_TABLET | Freq: Every day | ORAL | 0 refills | Status: DC

## 2022-06-30 NOTE — Assessment & Plan Note (Signed)
Estrace vaginal tablets prescribed

## 2022-06-30 NOTE — Assessment & Plan Note (Signed)
he patient was asked about her current tobacco use, congratulate  her reduction in use,  counselled on the long term risks of tobacco use, and was encouraged to start luncg cancer screening .  Referral t pulmonary clinic made.

## 2022-06-30 NOTE — Assessment & Plan Note (Addendum)
Currently well-controlled on current medications .  hemoglobin A1c is near goal of 7.0 on januvia, glipizide, and Actos. . Patient is  up to date on her  annual eye exam and foot exam is normal today. Patient has resolved  microalbuminuria on losartan .  She is  tolerating statin therapy for CAD risk reduction ;   LDL and triglycerides are at goal on atorvastatin 20 mg . Will remind her to   She has  no side effects and liver enzymes are normal .  No changes today    Lab Results  Component Value Date   HGBA1C 7.3 (H) 06/28/2022   Lab Results  Component Value Date   MICROALBUR <0.7 03/22/2022   MICROALBUR 3.6 (H) 03/17/2020        Lab Results  Component Value Date   CHOL 169 06/28/2022   HDL 48.10 06/28/2022   LDLCALC 110 (H) 06/28/2022   LDLDIRECT 104.0 06/28/2022   TRIG 54.0 06/28/2022   CHOLHDL 4 06/28/2022   Lab Results  Component Value Date   ALT 22 06/28/2022   AST 19 06/28/2022   ALKPHOS 104 06/28/2022   BILITOT 0.6 06/28/2022

## 2022-06-30 NOTE — Assessment & Plan Note (Signed)
Adding rybelsus to current regimen of actos, januvia and glipizide since A1c has risen   Lab Results  Component Value Date   HGBA1C 7.3 (H) 06/28/2022

## 2022-06-30 NOTE — Assessment & Plan Note (Signed)
Overdue for colon ca screening by patient choice due to fear of COVID.  Repeated the need for screening ; she is contemplative of the procedure and the risk of exposure.  Cologaurd ordered

## 2022-06-30 NOTE — Assessment & Plan Note (Signed)
Done today with considerable difficulty due to vaginal stenosis .

## 2022-06-30 NOTE — Assessment & Plan Note (Signed)

## 2022-06-30 NOTE — Progress Notes (Signed)
Your A1c has risen slightly, and now 7.3 so I please return in 3 months for recheck . You are maxed out on the 3 medications you are taking so I would like to add a 4th called Rybelsus,  which works tp lower sugars by by suppressing appetite .   we have samples you can come by to pick up (give her 90 days if we have it , 30 if not )    Your other labs are fine .  Daily aspirin is no longer recommended to prevent heart attacks due to risk of GI bleed so you can stop taking it.  Regards,   Deborra Medina, MD

## 2022-06-30 NOTE — Assessment & Plan Note (Signed)
Well controlled on llosartan/hct.  Renal function stable, no changes today.  Lab Results  Component Value Date   CREATININE 0.78 06/28/2022   Lab Results  Component Value Date   NA 139 06/28/2022   K 4.0 06/28/2022   CL 103 06/28/2022   CO2 27 06/28/2022

## 2022-06-30 NOTE — Assessment & Plan Note (Signed)
I have congratulated her in the lifestyle changes she is making and encouraged  Continued weight loss with goal of 4 lbs per month over the next 6 months using a low glycemic index diet and regular exercise a minimum of 5 days per week.

## 2022-07-02 LAB — CYTOLOGY - PAP
Adequacy: ABSENT
Comment: NEGATIVE
Diagnosis: NEGATIVE
High risk HPV: NEGATIVE

## 2022-07-03 DIAGNOSIS — B3731 Acute candidiasis of vulva and vagina: Secondary | ICD-10-CM | POA: Insufficient documentation

## 2022-07-03 MED ORDER — FLUCONAZOLE 150 MG PO TABS: 150.0000 mg | ORAL_TABLET | Freq: Every day | ORAL | 0 refills | Status: DC

## 2022-07-03 NOTE — Addendum Note (Signed)
Addended by: Crecencio Mc on: 07/03/2022 08:18 AM   Modules accepted: Orders

## 2022-07-03 NOTE — Assessment & Plan Note (Signed)
Asymptomatic,  found on PAP smear.  Fluconazole prescribed

## 2022-07-05 ENCOUNTER — Telehealth: Payer: Self-pay

## 2022-07-05 NOTE — Telephone Encounter (Signed)
error 

## 2022-07-12 DIAGNOSIS — Z1211 Encounter for screening for malignant neoplasm of colon: Secondary | ICD-10-CM | POA: Diagnosis not present

## 2022-07-13 ENCOUNTER — Telehealth: Payer: Self-pay

## 2022-07-13 ENCOUNTER — Encounter: Payer: Self-pay | Admitting: Internal Medicine

## 2022-07-13 ENCOUNTER — Ambulatory Visit: Payer: BC Managed Care – PPO | Admitting: Internal Medicine

## 2022-07-13 VITALS — BP 136/80 | HR 85 | Temp 97.7°F | Ht 62.0 in | Wt 229.4 lb

## 2022-07-13 DIAGNOSIS — E669 Obesity, unspecified: Secondary | ICD-10-CM

## 2022-07-13 DIAGNOSIS — E1169 Type 2 diabetes mellitus with other specified complication: Secondary | ICD-10-CM

## 2022-07-13 DIAGNOSIS — G4720 Circadian rhythm sleep disorder, unspecified type: Secondary | ICD-10-CM | POA: Diagnosis not present

## 2022-07-13 DIAGNOSIS — I152 Hypertension secondary to endocrine disorders: Secondary | ICD-10-CM

## 2022-07-13 DIAGNOSIS — E785 Hyperlipidemia, unspecified: Secondary | ICD-10-CM | POA: Diagnosis not present

## 2022-07-13 DIAGNOSIS — E1159 Type 2 diabetes mellitus with other circulatory complications: Secondary | ICD-10-CM

## 2022-07-13 MED ORDER — RYBELSUS 7 MG PO TABS: 1.0000 | ORAL_TABLET | Freq: Every day | ORAL | 2 refills | Status: DC

## 2022-07-13 NOTE — Assessment & Plan Note (Signed)
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  Since her retirement June 2023 she has had difficulty sleeping at night.  Recommending melatonin or relaxium

## 2022-07-13 NOTE — Patient Instructions (Addendum)
We are adding rybelsus for diabetes control  .  Continue  taking actos,  [place] and glipzide as well.  The starting dose is 3 mg.   We can increase your dose to 7 mg after 2 months if your fastings are > 130 or if your post prandial ("2 hours after eating) sugars are > 160  Increase your water intake  gradually to 60 ounces daily  If you get constipated,  add a magnesium tablet daily    I recommend trying melatonin for your insomnia.  It is not a sedative,  But must be taken on  a regular basis to help your internal clock.  Take every evening after dinner start with 3 mg dose   Max effective dose is 6 mg   If melatonin doesn't work.  Try using Relaxium for insomnia  (as seen on TV commercials) . It contains:  Melatonin 5 mg  Chamomile 25 mg Passionflower extract 75 mg GABA 100 mg Ashwaganda extract 125 mg Magnesium citrate, glycinate, oxide (100 mg)  L tryptophan 500 mg Valerest (proprietary  ingredient ; probably valeria root extract)

## 2022-07-13 NOTE — Progress Notes (Unsigned)
Subjective:  Patient ID: Langston Reusing, female    DOB: 1961/03/04  Age: 62 y.o. MRN: [id number]  CC: There were no encounter diagnoses.   HPI RENISHA DIBATTISTA presents for  Chief Complaint  Patient presents with   Medical Management of Chronic Issues    2 week follow up on diabetes   1) typ 2 diabetes mellitus:  taking maximal doses of  januvia , actos and glipizide to avoid use of insulin due to needle phobia.   Recommending starting rybelsus   Lab Results  Component Value Date   HGBA1C 7.3 (H) 06/28/2022      Outpatient Medications Prior to Visit  Medication Sig Dispense Refill   atorvastatin (LIPITOR) 20 MG tablet TAKE 1 TABLET DAILY 90 tablet 3   Blood Glucose Monitoring Suppl (ONETOUCH VERIO FLEX SYSTEM) w/Device KIT Use to check blood sugar up to TID. 1 kit 3   Cholecalciferol (VITAMIN D) 50 MCG (2000 UT) CAPS Take 1,000 Units by mouth daily.      Estradiol 10 MCG TABS vaginal tablet Insert one tablet vaginally each night for 14 days,  then twice weekly thereafter 18 tablet 0   glipiZIDE (GLUCOTROL) 10 MG tablet TAKE 1 TABLET TWICE A DAY BEFORE MEALS 180 tablet 3   Lancets (ONETOUCH ULTRASOFT) lancets Use as instructed to check sugars up to TID 300 each 3   losartan-hydrochlorothiazide (HYZAAR) 100-25 MG tablet TAKE 1 TABLET DAILY 90 tablet 3   meloxicam (MOBIC) 15 MG tablet meloxicam 15 mg tablet  Take 1 tablet every day by oral route.     ONETOUCH ULTRA test strip USE TO TEST BLOOD SUGARS UP TO THREE TIMES DAILY 300 strip 1   pioglitazone (ACTOS) 15 MG tablet TAKE 1 TABLET DAILY 90 tablet 3   Semaglutide (RYBELSUS) 3 MG TABS Take 1 tablet (3 mg total) by mouth daily. 30 tablet 0   sitaGLIPtin (JANUVIA) 100 MG tablet Take 1 tablet (100 mg total) by mouth daily. 90 tablet 2   fluconazole (DIFLUCAN) 150 MG tablet Take 1 tablet (150 mg total) by mouth daily. (Patient not taking: Reported on 07/13/2022) 2 tablet 0   No facility-administered medications prior to visit.     Review of Systems;  Patient denies headache, fevers, malaise, unintentional weight loss, skin rash, eye pain, sinus congestion and sinus pain, sore throat, dysphagia,  hemoptysis , cough, dyspnea, wheezing, chest pain, palpitations, orthopnea, edema, abdominal pain, nausea, melena, diarrhea, constipation, flank pain, dysuria, hematuria, urinary  Frequency, nocturia, numbness, tingling, seizures,  Focal weakness, Loss of consciousness,  Tremor, insomnia, depression, anxiety, and suicidal ideation.      Objective:  BP 136/80   Pulse 85   Temp 97.7 F (36.5 C) (Oral)   Ht '5\' 2"'$  (1.575 m)   Wt 229 lb 6.4 oz (104.1 kg)   SpO2 97%   BMI 41.96 kg/m   BP Readings from Last 3 Encounters:  07/13/22 136/80  06/28/22 122/66  06/16/21 138/72    Wt Readings from Last 3 Encounters:  07/13/22 229 lb 6.4 oz (104.1 kg)  06/28/22 231 lb 3.2 oz (104.9 kg)  06/16/21 214 lb 6.4 oz (97.3 kg)    Physical Exam  Lab Results  Component Value Date   HGBA1C 7.3 (H) 06/28/2022   HGBA1C 6.9 (H) 03/22/2022   HGBA1C 7.4 (H) 06/13/2021    Lab Results  Component Value Date   CREATININE 0.78 06/28/2022   CREATININE 0.61 03/22/2022   CREATININE 0.76 06/13/2021    Lab  Results  Component Value Date   WBC 6.3 06/28/2022   HGB 14.5 06/28/2022   HCT 42.9 06/28/2022   PLT 260.0 06/28/2022   GLUCOSE 154 (H) 06/28/2022   CHOL 169 06/28/2022   TRIG 54.0 06/28/2022   HDL 48.10 06/28/2022   LDLDIRECT 104.0 06/28/2022   LDLCALC 110 (H) 06/28/2022   ALT 22 06/28/2022   AST 19 06/28/2022   NA 139 06/28/2022   K 4.0 06/28/2022   CL 103 06/28/2022   CREATININE 0.78 06/28/2022   BUN 14 06/28/2022   CO2 27 06/28/2022   TSH 1.18 06/28/2022   HGBA1C 7.3 (H) 06/28/2022   MICROALBUR <0.7 03/22/2022    No results found.  Assessment & Plan:  .There are no diagnoses linked to this encounter.   I provided 30 minutes of face-to-face time during this encounter reviewing patient's last visit with me,  patient's  most recent visit with cardiology,  nephrology,  and neurology,  recent surgical and non surgical procedures, previous  labs and imaging studies, counseling on currently addressed issues,  and post visit ordering to diagnostics and therapeutics .   Follow-up: No follow-ups on file.   Crecencio Mc, MD

## 2022-07-13 NOTE — Telephone Encounter (Signed)
Medication Samples have been provided to the patient.  Drug name: Rybelsus       Strength: 3 mg         Qty: 2 boxes  LOT: [id number]  Exp.Date: 04/14/2023  Dosing instructions: Take 1 tablet by mouth daily.   The patient has been instructed regarding the correct time, dose, and frequency of taking this medication, including desired effects and most common side effects.   Samyrah Bruster 2:24 PM 07/13/2022

## 2022-07-15 NOTE — Assessment & Plan Note (Signed)
Currently well-controlled on current medications .  Patient has resolved  microalbuminuria on losartan .  She is  tolerating statin therapy for CAD risk reduction ;   LDL and triglycerides are at goal on atorvastatin 20 mg . Will remind her to   She has  no side effects and liver enzymes are normal .  No changes today    Lab Results  Component Value Date   HGBA1C 7.3 (H) 06/28/2022   Lab Results  Component Value Date   MICROALBUR <0.7 03/22/2022   MICROALBUR 3.6 (H) 03/17/2020        Lab Results  Component Value Date   CHOL 169 06/28/2022   HDL 48.10 06/28/2022   LDLCALC 110 (H) 06/28/2022   LDLDIRECT 104.0 06/28/2022   TRIG 54.0 06/28/2022   CHOLHDL 4 06/28/2022   Lab Results  Component Value Date   ALT 22 06/28/2022   AST 19 06/28/2022   ALKPHOS 104 06/28/2022   BILITOT 0.6 06/28/2022

## 2022-07-15 NOTE — Assessment & Plan Note (Signed)
Adding Rybelsus to current regimen of actos, januvia and glipizide since A1c has risen   Lab Results  Component Value Date   HGBA1C 7.3 (H) 06/28/2022   Lab Results  Component Value Date   MICROALBUR <0.7 03/22/2022   MICROALBUR 3.6 (H) 03/17/2020

## 2022-07-23 LAB — COLOGUARD: COLOGUARD: POSITIVE — AB

## 2022-07-26 DIAGNOSIS — R195 Other fecal abnormalities: Secondary | ICD-10-CM | POA: Insufficient documentation

## 2022-07-27 ENCOUNTER — Telehealth: Payer: Self-pay | Admitting: Internal Medicine

## 2022-07-27 ENCOUNTER — Other Ambulatory Visit (HOSPITAL_COMMUNITY): Payer: Self-pay

## 2022-07-27 NOTE — Telephone Encounter (Signed)
Attempted to call pt. No answer. Mail box is full.  

## 2022-07-27 NOTE — Assessment & Plan Note (Signed)
Patient advised March 14,  she is undecided about GI referral

## 2022-07-27 NOTE — Telephone Encounter (Signed)
Pt would like the cma to call her. She did not say what it was about and she stated she wanted to set up an appointment with the doctor

## 2022-08-01 ENCOUNTER — Telehealth: Payer: Self-pay | Admitting: Internal Medicine

## 2022-08-01 DIAGNOSIS — Z1211 Encounter for screening for malignant neoplasm of colon: Secondary | ICD-10-CM

## 2022-08-01 DIAGNOSIS — R195 Other fecal abnormalities: Secondary | ICD-10-CM

## 2022-08-01 NOTE — Telephone Encounter (Signed)
Pt called in staying that she would like to speak to [facility] CMA to set up her colonoscopy appt? Any questions, she's [phone].

## 2022-08-01 NOTE — Telephone Encounter (Signed)
REFERRAL TO DR Haig Prophet AT [facility]

## 2022-08-01 NOTE — Telephone Encounter (Signed)
Spoke with pt and she stated that she would like to go forward with the referral for a colonoscopy. I have pended the referral for your approval.

## 2022-08-02 NOTE — Telephone Encounter (Signed)
Pt is aware.  

## 2022-08-07 DIAGNOSIS — R195 Other fecal abnormalities: Secondary | ICD-10-CM | POA: Diagnosis not present

## 2022-09-13 ENCOUNTER — Encounter: Payer: Self-pay | Admitting: *Deleted

## 2022-09-14 ENCOUNTER — Encounter: Payer: Self-pay | Admitting: *Deleted

## 2022-09-14 ENCOUNTER — Ambulatory Visit: Payer: BC Managed Care – PPO | Admitting: Certified Registered"

## 2022-09-14 ENCOUNTER — Ambulatory Visit
Admission: RE | Admit: 2022-09-14 | Discharge: 2022-09-14 | Disposition: A | Discharge status: Home / Self Care | Payer: BC Managed Care – PPO | Attending: Gastroenterology | Admitting: Gastroenterology

## 2022-09-14 ENCOUNTER — Encounter
Admission: RE | Disposition: A | Discharge status: Home / Self Care | Payer: Self-pay | Source: Home / Self Care | Attending: Gastroenterology

## 2022-09-14 DIAGNOSIS — K640 First degree hemorrhoids: Secondary | ICD-10-CM | POA: Insufficient documentation

## 2022-09-14 DIAGNOSIS — R195 Other fecal abnormalities: Secondary | ICD-10-CM | POA: Diagnosis not present

## 2022-09-14 DIAGNOSIS — K649 Unspecified hemorrhoids: Secondary | ICD-10-CM | POA: Diagnosis not present

## 2022-09-14 DIAGNOSIS — I10 Essential (primary) hypertension: Secondary | ICD-10-CM | POA: Diagnosis not present

## 2022-09-14 DIAGNOSIS — D123 Benign neoplasm of transverse colon: Secondary | ICD-10-CM | POA: Diagnosis not present

## 2022-09-14 DIAGNOSIS — Z7984 Long term (current) use of oral hypoglycemic drugs: Secondary | ICD-10-CM | POA: Insufficient documentation

## 2022-09-14 DIAGNOSIS — E119 Type 2 diabetes mellitus without complications: Secondary | ICD-10-CM | POA: Diagnosis not present

## 2022-09-14 DIAGNOSIS — D125 Benign neoplasm of sigmoid colon: Secondary | ICD-10-CM | POA: Diagnosis not present

## 2022-09-14 DIAGNOSIS — K635 Polyp of colon: Secondary | ICD-10-CM | POA: Diagnosis not present

## 2022-09-14 DIAGNOSIS — Z1211 Encounter for screening for malignant neoplasm of colon: Secondary | ICD-10-CM | POA: Diagnosis not present

## 2022-09-14 DIAGNOSIS — D126 Benign neoplasm of colon, unspecified: Secondary | ICD-10-CM | POA: Diagnosis not present

## 2022-09-14 HISTORY — PX: COLONOSCOPY WITH PROPOFOL: SHX5780

## 2022-09-14 LAB — GLUCOSE, CAPILLARY: Glucose-Capillary: 142 mg/dL — ABNORMAL HIGH (ref 70–99)

## 2022-09-14 SURGERY — COLONOSCOPY WITH PROPOFOL
Anesthesia: General

## 2022-09-14 MED ORDER — PROPOFOL 500 MG/50ML IV EMUL
INTRAVENOUS | Status: DC | PRN
  Administered 2022-09-14: 40 mg via INTRAVENOUS
  Administered 2022-09-14: 150 ug/kg/min via INTRAVENOUS
  Administered 2022-09-14: 50 mg via INTRAVENOUS

## 2022-09-14 MED ORDER — SODIUM CHLORIDE 0.9 % IV SOLN: INTRAVENOUS | Status: DC

## 2022-09-14 MED ORDER — LIDOCAINE HCL (CARDIAC) PF 100 MG/5ML IV SOSY
PREFILLED_SYRINGE | INTRAVENOUS | Status: DC | PRN
  Administered 2022-09-14: 50 mg via INTRAVENOUS

## 2022-09-14 MED ORDER — EPINEPHRINE 1 MG/10ML IJ SOSY
PREFILLED_SYRINGE | INTRAMUSCULAR | Status: AC
  Filled 2022-09-14: qty 10

## 2022-09-14 MED ORDER — SODIUM CHLORIDE (PF) 0.9 % IJ SOLN
PREFILLED_SYRINGE | INTRAMUSCULAR | Status: DC | PRN
  Administered 2022-09-14: 2 mL

## 2022-09-14 NOTE — Interval H&P Note (Signed)
History and Physical Interval Note:  09/14/2022 8:45 AM  Katelyn Proctor  has presented today for surgery, with the diagnosis of (+) COLORECTAL SCREENING.  The various methods of treatment have been discussed with the patient and family. After consideration of risks, benefits and other options for treatment, the patient has consented to  Procedure(s): COLONOSCOPY WITH PROPOFOL (N/A) as a surgical intervention.  The patient's history has been reviewed, patient examined, no change in status, stable for surgery.  I have reviewed the patient's chart and labs.  Questions were answered to the patient's satisfaction.     Regis Bill  Ok to proceed with colonoscopy

## 2022-09-14 NOTE — Anesthesia Postprocedure Evaluation (Signed)
Anesthesia Post Note  Patient: Katelyn Proctor  Procedure(s) Performed: COLONOSCOPY WITH PROPOFOL  Patient location during evaluation: PACU Anesthesia Type: General Level of consciousness: awake and oriented Pain management: satisfactory to patient Vital Signs Assessment: post-procedure vital signs reviewed and stable Respiratory status: spontaneous breathing and respiratory function stable Cardiovascular status: blood pressure returned to baseline Anesthetic complications: no   There were no known notable events for this encounter.   Last Vitals:  Vitals:   09/14/22 0948 09/14/22 0956  BP: (!) 145/78 (!) 156/74  Pulse: 72 69  Resp:    Temp:    SpO2: 100% 99%    Last Pain:  Vitals:   09/14/22 0948  TempSrc:   PainSc: 0-No pain                 VAN STAVEREN,Jaylynn Siefert

## 2022-09-14 NOTE — Transfer of Care (Signed)
Immediate Anesthesia Transfer of Care Note  Patient: Katelyn Proctor  Procedure(s) Performed: COLONOSCOPY WITH PROPOFOL  Patient Location: PACU  Anesthesia Type:General  Level of Consciousness: awake, alert , and oriented  Airway & Oxygen Therapy: Patient Spontanous Breathing  Post-op Assessment: Report given to RN and Post -op Vital signs reviewed and stable  Post vital signs: stable  Last Vitals:  Vitals Value Taken Time  BP 138/80 09/14/22 0935  Temp 35.6 C 09/14/22 0935  Pulse 72 09/14/22 0936  Resp    SpO2 99 % 09/14/22 0936  Vitals shown include unvalidated device data.  Last Pain:  Vitals:   09/14/22 0935  TempSrc:   PainSc: Asleep         Complications: No notable events documented.

## 2022-09-14 NOTE — H&P (Signed)
Outpatient short stay form Pre-procedure 09/14/2022  Regis Bill, MD  Primary Physician: Sherlene Shams, MD  Reason for visit:  Positive cologuard  History of present illness:    62 y/o lady with history of hypertension, DM II, and obesity here for colonoscopy due to positive cologuard. No family history of GI malignancies. No blood thinners. History of c-section. No new GI symptoms.    Current Facility-Administered Medications:    0.9 %  sodium chloride infusion, , Intravenous, Continuous, Wolf Boulay, Rossie Muskrat, MD, Last Rate: 20 mL/hr at 09/14/22 0836, Continued from Pre-op at 09/14/22 0836  Medications Prior to Admission  Medication Sig Dispense Refill Last Dose   glipiZIDE (GLUCOTROL) 10 MG tablet TAKE 1 TABLET TWICE A DAY BEFORE MEALS 180 tablet 3 Past Week   losartan-hydrochlorothiazide (HYZAAR) 100-25 MG tablet TAKE 1 TABLET DAILY 90 tablet 3 09/14/2022   meloxicam (MOBIC) 15 MG tablet meloxicam 15 mg tablet  Take 1 tablet every day by oral route.   Past Month   pioglitazone (ACTOS) 15 MG tablet TAKE 1 TABLET DAILY 90 tablet 3 Past Week   atorvastatin (LIPITOR) 20 MG tablet TAKE 1 TABLET DAILY 90 tablet 3    Blood Glucose Monitoring Suppl (ONETOUCH VERIO FLEX SYSTEM) w/Device KIT Use to check blood sugar up to TID. 1 kit 3    Cholecalciferol (VITAMIN D) 50 MCG (2000 UT) CAPS Take 1,000 Units by mouth daily.       Estradiol 10 MCG TABS vaginal tablet Insert one tablet vaginally each night for 14 days,  then twice weekly thereafter 18 tablet 0    Lancets (ONETOUCH ULTRASOFT) lancets Use as instructed to check sugars up to TID 300 each 3    ONETOUCH ULTRA test strip USE TO TEST BLOOD SUGARS UP TO THREE TIMES DAILY 300 strip 1    Semaglutide (RYBELSUS) 3 MG TABS Take 1 tablet (3 mg total) by mouth daily. 30 tablet 0    Semaglutide (RYBELSUS) 7 MG TABS Take 1 tablet (7 mg total) by mouth daily. 30 tablet 2 09/10/2022   sitaGLIPtin (JANUVIA) 100 MG tablet Take 1 tablet (100 mg  total) by mouth daily. 90 tablet 2 09/10/2022     Allergies  Allergen Reactions   Septra Ds [Sulfamethoxazole-Trimethoprim] Hives     Past Medical History:  Diagnosis Date   Abnormal iron saturation 12/16/2019   Anemia    Chronic sinusitis    Diabetes mellitus without complication (HCC)    GERD (gastroesophageal reflux disease)    Hyperlipidemia    Hypertension    Tobacco abuse     Review of systems:  Otherwise negative.    Physical Exam  Gen: Alert, oriented. Appears stated age.  HEENT: PERRLA. Lungs: No respiratory distress CV: RRR Abd: soft, benign, no masses Ext: No edema    Planned procedures: Proceed with colonoscopy. The patient understands the nature of the planned procedure, indications, risks, alternatives and potential complications including but not limited to bleeding, infection, perforation, damage to internal organs and possible oversedation/side effects from anesthesia. The patient agrees and gives consent to proceed.  Please refer to procedure notes for findings, recommendations and patient disposition/instructions.     Regis Bill, MD [facility]

## 2022-09-14 NOTE — Op Note (Signed)
[facility] Patient Name: Katelyn Proctor Procedure Date: 09/14/2022 8:47 AM MRN: [id number] Account #: [id number] Date of Birth: 1961-02-16 Admit Type: Outpatient Age: 62 Room: [facility] ENDO ROOM 3 Gender: Female Note Status: Finalized Instrument Name: Prentice Docker [id number] Procedure:             Colonoscopy Indications:           Positive Cologuard test Providers:             Eather Colas MD, MD Referring MD:          Duncan Dull, MD (Referring MD) Medicines:             Monitored Anesthesia Care Complications:         No immediate complications. Estimated blood loss:                         Minimal. Procedure:             Pre-Anesthesia Assessment:                        - Prior to the procedure, a History and Physical was                         performed, and patient medications and allergies were                         reviewed. The patient is competent. The risks and                         benefits of the procedure and the sedation options and                         risks were discussed with the patient. All questions                         were answered and informed consent was obtained.                         Patient identification and proposed procedure were                         verified by the physician, the nurse, the                         anesthesiologist, the anesthetist and the technician                         in the endoscopy suite. Mental Status Examination:                         alert and oriented. Airway Examination: normal                         oropharyngeal airway and neck mobility. Respiratory                         Examination: clear to auscultation. CV Examination:  normal. Prophylactic Antibiotics: The patient does not                         require prophylactic antibiotics. Prior                         Anticoagulants: The patient has taken no anticoagulant                         or  antiplatelet agents. ASA Grade Assessment: III - A                         patient with severe systemic disease. After reviewing                         the risks and benefits, the patient was deemed in                         satisfactory condition to undergo the procedure. The                         anesthesia plan was to use monitored anesthesia care                         (MAC). Immediately prior to administration of                         medications, the patient was re-assessed for adequacy                         to receive sedatives. The heart rate, respiratory                         rate, oxygen saturations, blood pressure, adequacy of                         pulmonary ventilation, and response to care were                         monitored throughout the procedure. The physical                         status of the patient was re-assessed after the                         procedure.                        After obtaining informed consent, the colonoscope was                         passed under direct vision. Throughout the procedure,                         the patient's blood pressure, pulse, and oxygen                         saturations were monitored continuously. The  Colonoscope was introduced through the anus and                         advanced to the the cecum, identified by appendiceal                         orifice and ileocecal valve. The colonoscopy was                         performed without difficulty. The patient tolerated                         the procedure well. The quality of the bowel                         preparation was good. The ileocecal valve, appendiceal                         orifice, and rectum were photographed. Findings:      The perianal and digital rectal examinations were normal.      Five sessile polyps were found in the proximal transverse colon. The       polyps were 3 to 10 mm in size. These polyps were  removed with a cold       snare. Resection and retrieval were complete. Estimated blood loss was       minimal.      Five sessile polyps were found in the mid transverse colon and distal       transverse colon. The polyps were 3 to 9 mm in size. These polyps were       removed with a cold snare. Resection and retrieval were complete.       Estimated blood loss was minimal.      A 23 mm polyp was found in the sigmoid colon. The polyp was       pedunculated. Stalk was successfully injected with 3 mL of a 0.1 mg/mL       solution of epinephrine for prevention of bleeding after polypectomy.       The polyp was removed with a hot snare. Resection and retrieval were       complete. To prevent bleeding after the polypectomy, two hemostatic       clips were successfully placed. There was no bleeding during, or at the       end, of the procedure.      The colon started to spasm after removal of the 10 transverse colon       polyps so focus was turned to large sigmoid polyp. There appeared to be       smaller polyps but after removal of large polyp, decision was made to       stop procedure due to colon spasm and procedural length.      Internal hemorrhoids were found during retroflexion. The hemorrhoids       were Grade I (internal hemorrhoids that do not prolapse).      The exam was otherwise without abnormality on direct and retroflexion       views. Impression:            - Five 3 to 10 mm polyps in the proximal transverse  colon, removed with a cold snare. Resected and                         retrieved.                        - Five 3 to 9 mm polyps in the mid transverse colon                         and in the distal transverse colon, removed with a                         cold snare. Resected and retrieved.                        - One 23 mm polyp in the sigmoid colon, removed with a                         hot snare. Resected and retrieved. Injected. Clips                          were placed.                        - Internal hemorrhoids.                        - The examination was otherwise normal on direct and                         retroflexion views. Recommendation:        - Discharge patient to home.                        - Resume previous diet.                        - Continue present medications.                        - Await pathology results.                        - Repeat colonoscopy in 6 months for removal of                         remaining polyps.                        - Return to referring physician as previously                         scheduled. Procedure Code(s):     --- Professional ---                        [phone], Colonoscopy, flexible; with removal of                         tumor(s), polyp(s), or other lesion(s) by snare  technique Diagnosis Code(s):     --- Professional ---                        D12.3, Benign neoplasm of transverse colon (hepatic                         flexure or splenic flexure)                        D12.5, Benign neoplasm of sigmoid colon                        K64.0, First degree hemorrhoids                        R19.5, Other fecal abnormalities CPT copyright 2022 American [employer] Association. All rights reserved. The codes documented in this report are preliminary and upon coder review may  be revised to meet current compliance requirements. Eather Colas MD, MD 09/14/2022 9:33:32 AM Number of Addenda: 0 Note Initiated On: 09/14/2022 8:47 AM Scope Withdrawal Time: 0 hours 28 minutes 56 seconds  Total Procedure Duration: 0 hours 33 minutes 25 seconds  Estimated Blood Loss:  Estimated blood loss was minimal.      [facility]

## 2022-09-14 NOTE — Anesthesia Preprocedure Evaluation (Signed)
Anesthesia Evaluation  Patient identified by MRN, date of birth, ID band Patient awake    Reviewed: Allergy & Precautions, NPO status , Patient's Chart, lab work & pertinent test results  Airway Mallampati: III  TM Distance: >3 FB Neck ROM: Full    Dental  (+) Teeth Intact   Pulmonary neg pulmonary ROS, COPD, Current Smoker   Pulmonary exam normal  + decreased breath sounds      Cardiovascular Exercise Tolerance: Good hypertension, Pt. on medications negative cardio ROS Normal cardiovascular exam Rhythm:Regular Rate:Normal     Neuro/Psych negative neurological ROS  negative psych ROS   GI/Hepatic negative GI ROS, Neg liver ROS,GERD  Medicated,,  Endo/Other  negative endocrine ROSdiabetes, Type 2, Oral Hypoglycemic Agents  Morbid obesity  Renal/GU negative Renal ROS  negative genitourinary   Musculoskeletal  (+) Arthritis ,    Abdominal  (+) + obese  Peds negative pediatric ROS (+)  Hematology negative hematology ROS (+) Blood dyscrasia, anemia   Anesthesia Other Findings Past Medical History: 12/16/2019: Abnormal iron saturation No date: Anemia No date: Chronic sinusitis No date: Diabetes mellitus without complication (HCC) No date: GERD (gastroesophageal reflux disease) No date: Hyperlipidemia No date: Hypertension No date: Tobacco abuse  Past Surgical History: 02/20/2018: BREAST BIOPSY; Right     Comment:  top hat, neg No date: CESAREAN SECTION 2003: NASAL SINUS SURGERY 1995: TUBAL LIGATION     Reproductive/Obstetrics negative OB ROS                             Anesthesia Physical Anesthesia Plan  ASA: 3  Anesthesia Plan: General   Post-op Pain Management:    Induction: Intravenous  PONV Risk Score and Plan: Propofol infusion and TIVA  Airway Management Planned: Natural Airway  Additional Equipment:   Intra-op Plan:   Post-operative Plan:   Informed  Consent: I have reviewed the patients History and Physical, chart, labs and discussed the procedure including the risks, benefits and alternatives for the proposed anesthesia with the patient or authorized representative who has indicated his/her understanding and acceptance.     Dental Advisory Given  Plan Discussed with: CRNA and Surgeon  Anesthesia Plan Comments:        Anesthesia Quick Evaluation

## 2022-09-17 ENCOUNTER — Encounter: Payer: Self-pay | Admitting: Gastroenterology

## 2022-09-18 LAB — SURGICAL PATHOLOGY

## 2022-09-19 ENCOUNTER — Encounter: Payer: Self-pay | Admitting: Gastroenterology

## 2022-09-21 ENCOUNTER — Other Ambulatory Visit: Payer: Self-pay | Admitting: Internal Medicine

## 2022-09-21 DIAGNOSIS — E1165 Type 2 diabetes mellitus with hyperglycemia: Secondary | ICD-10-CM

## 2022-09-25 ENCOUNTER — Other Ambulatory Visit: Payer: Self-pay | Admitting: Internal Medicine

## 2022-09-25 DIAGNOSIS — E7800 Pure hypercholesterolemia, unspecified: Secondary | ICD-10-CM

## 2022-10-15 ENCOUNTER — Ambulatory Visit: Payer: BC Managed Care – PPO | Admitting: Internal Medicine

## 2022-11-26 ENCOUNTER — Ambulatory Visit: Payer: BC Managed Care – PPO | Admitting: Internal Medicine

## 2022-11-26 ENCOUNTER — Encounter: Payer: Self-pay | Admitting: Internal Medicine

## 2022-11-26 VITALS — BP 148/76 | HR 86 | Temp 97.2°F | Ht 62.0 in | Wt 232.6 lb

## 2022-11-26 DIAGNOSIS — E7800 Pure hypercholesterolemia, unspecified: Secondary | ICD-10-CM

## 2022-11-26 DIAGNOSIS — R195 Other fecal abnormalities: Secondary | ICD-10-CM | POA: Diagnosis not present

## 2022-11-26 DIAGNOSIS — I10 Essential (primary) hypertension: Secondary | ICD-10-CM

## 2022-11-26 DIAGNOSIS — E1159 Type 2 diabetes mellitus with other circulatory complications: Secondary | ICD-10-CM

## 2022-11-26 DIAGNOSIS — E785 Hyperlipidemia, unspecified: Secondary | ICD-10-CM | POA: Diagnosis not present

## 2022-11-26 DIAGNOSIS — E1169 Type 2 diabetes mellitus with other specified complication: Secondary | ICD-10-CM | POA: Diagnosis not present

## 2022-11-26 DIAGNOSIS — I152 Hypertension secondary to endocrine disorders: Secondary | ICD-10-CM

## 2022-11-26 DIAGNOSIS — Z7984 Long term (current) use of oral hypoglycemic drugs: Secondary | ICD-10-CM

## 2022-11-26 DIAGNOSIS — E1165 Type 2 diabetes mellitus with hyperglycemia: Secondary | ICD-10-CM

## 2022-11-26 DIAGNOSIS — E669 Obesity, unspecified: Secondary | ICD-10-CM

## 2022-11-26 LAB — COMPREHENSIVE METABOLIC PANEL
ALT: 33 U/L (ref 0–35)
AST: 33 U/L (ref 0–37)
Albumin: 3.8 g/dL (ref 3.5–5.2)
Alkaline Phosphatase: 94 U/L (ref 39–117)
BUN: 12 mg/dL (ref 6–23)
CO2: 27 mEq/L (ref 19–32)
Calcium: 9.6 mg/dL (ref 8.4–10.5)
Chloride: 105 mEq/L (ref 96–112)
Creatinine, Ser: 0.76 mg/dL (ref 0.40–1.20)
GFR: 83.46 mL/min (ref >60.00)
Glucose, Bld: 144 mg/dL — ABNORMAL HIGH (ref 70–99)
Potassium: 3.9 mEq/L (ref 3.5–5.1)
Sodium: 139 mEq/L (ref 135–145)
Total Bilirubin: 0.6 mg/dL (ref 0.2–1.2)
Total Protein: 7.3 g/dL (ref 6.0–8.3)

## 2022-11-26 LAB — LDL CHOLESTEROL, DIRECT: Direct LDL: 93 mg/dL

## 2022-11-26 LAB — LIPID PANEL
Cholesterol: 144 mg/dL (ref 0–200)
HDL: 39.2 mg/dL (ref >39.00)
LDL Cholesterol: 90 mg/dL (ref 0–99)
NonHDL: 104.43
Total CHOL/HDL Ratio: 4
Triglycerides: 70 mg/dL (ref 0.0–149.0)
VLDL: 14 mg/dL (ref 0.0–40.0)

## 2022-11-26 LAB — HEMOGLOBIN A1C: Hgb A1c MFr Bld: 7 % — ABNORMAL HIGH (ref 4.6–6.5)

## 2022-11-26 MED ORDER — GLIPIZIDE 10 MG PO TABS: 10.0000 mg | ORAL_TABLET | Freq: Two times a day (BID) | ORAL | 3 refills | Status: DC

## 2022-11-26 MED ORDER — ESTRADIOL 10 MCG VA TABS: ORAL_TABLET | VAGINAL | 0 refills | Status: DC

## 2022-11-26 MED ORDER — PIOGLITAZONE HCL 15 MG PO TABS: 15.0000 mg | ORAL_TABLET | Freq: Every day | ORAL | 3 refills | Status: DC

## 2022-11-26 MED ORDER — AMLODIPINE BESYLATE 2.5 MG PO TABS: 2.5000 mg | ORAL_TABLET | Freq: Every day | ORAL | 1 refills | Status: DC

## 2022-11-26 NOTE — Progress Notes (Unsigned)
Subjective:  Patient ID: Katelyn Proctor, female    DOB: January 20, 1961  Age: 62 y.o. MRN: [id number]  CC: The primary encounter diagnosis was Essential hypertension. Diagnoses of Uncontrolled type 2 diabetes mellitus with hyperglycemia (HCC), Hyperlipidemia associated with type 2 diabetes mellitus (HCC), Positive colorectal cancer screening using Cologuard test, Obesity, morbid, BMI 40.0-49.9 (HCC), Obesity, diabetes, and hypertension syndrome (HCC), and Pure hypercholesterolemia were also pertinent to this visit.   HPI Katelyn Proctor presents for  Chief Complaint  Patient presents with   Medical Management of Chronic Issues    3 month follow up    1) HTN:  elevated in office today.  Taking losartan hct as directed. Home readings have not been checked   2) T2DM: has had a significant weight gain.  no longer taking Rybelsus (stopped taking it about 3 weeks ago)   due to recurrent abdominal cramping and constipation that did not respond to stool softeners.  Walking on treadmill 30 minutes daily  .Marland Kitchen  Taking glipizide and Actos . Last A1c 7.3 in February.  Taking januvia ,  glipizide and maximal doses and  actos at 15 mg   Sugars were 105. 98. 87   no change since stopping  rybelsus.  Has changed diet:  no processed food. Lots of fruit.  Cans her neighbor's fresh vegetables without salt.   3) polyposis:  10 transverse colonic polyps TAs no high grade dysplasia ) and one 2.3 cm pedunculated sigmoid polyp (TA with high grade dysplasia ) were  removed from colon May 2024..  repeat 6 month planned.in December.  4) GAD:  stress from daughter's health problems involving vision loss in left eye since adolescence.  Had an LP which was normal.        Outpatient Medications Prior to Visit  Medication Sig Dispense Refill   Blood Glucose Monitoring Suppl (ONETOUCH VERIO FLEX SYSTEM) w/Device KIT Use to check blood sugar up to TID. 1 kit 3   Cholecalciferol (VITAMIN D) 50 MCG (2000 UT) CAPS Take 1,000 Units  by mouth daily.      JANUVIA 100 MG tablet TAKE 1 TABLET DAILY 90 tablet 3   Lancets (ONETOUCH ULTRASOFT) lancets Use as instructed to check sugars up to TID 300 each 3   losartan-hydrochlorothiazide (HYZAAR) 100-25 MG tablet TAKE 1 TABLET DAILY 90 tablet 3   meloxicam (MOBIC) 15 MG tablet meloxicam 15 mg tablet  Take 1 tablet every day by oral route.     ONETOUCH ULTRA test strip USE TO TEST BLOOD SUGARS UP TO THREE TIMES DAILY 300 strip 1   atorvastatin (LIPITOR) 20 MG tablet TAKE 1 TABLET DAILY 90 tablet 3   Estradiol 10 MCG TABS vaginal tablet Insert one tablet vaginally each night for 14 days,  then twice weekly thereafter 18 tablet 0   glipiZIDE (GLUCOTROL) 10 MG tablet TAKE 1 TABLET TWICE A DAY BEFORE MEALS 180 tablet 3   pioglitazone (ACTOS) 15 MG tablet TAKE 1 TABLET DAILY 90 tablet 3   Semaglutide (RYBELSUS) 3 MG TABS Take 1 tablet (3 mg total) by mouth daily. (Patient not taking: Reported on 11/26/2022) 30 tablet 0   Semaglutide (RYBELSUS) 7 MG TABS Take 1 tablet (7 mg total) by mouth daily. (Patient not taking: Reported on 11/26/2022) 30 tablet 2   No facility-administered medications prior to visit.    Review of Systems;  Patient denies headache, fevers, malaise, unintentional weight loss, skin rash, eye pain, sinus congestion and sinus pain, sore throat, dysphagia,  hemoptysis , cough, dyspnea, wheezing, chest pain, palpitations, orthopnea, edema, abdominal pain, nausea, melena, diarrhea, constipation, flank pain, dysuria, hematuria, urinary  Frequency, nocturia, numbness, tingling, seizures,  Focal weakness, Loss of consciousness,  Tremor, insomnia, depression, anxiety, and suicidal ideation.      Objective:  BP (!) 148/76   Pulse 86   Temp (!) 97.2 F (36.2 C) (Oral)   Ht 5\' 2"  (1.575 m)   Wt 232 lb 9.6 oz (105.5 kg)   SpO2 96%   BMI 42.54 kg/m   BP Readings from Last 3 Encounters:  11/26/22 (!) 148/76  09/14/22 (!) 156/74  07/13/22 136/80    Wt Readings from  Last 3 Encounters:  11/26/22 232 lb 9.6 oz (105.5 kg)  09/14/22 232 lb 6.4 oz (105.4 kg)  07/13/22 229 lb 6.4 oz (104.1 kg)    Physical Exam Vitals reviewed.  Constitutional:      General: She is not in acute distress.    Appearance: Normal appearance. She is normal weight. She is not ill-appearing, toxic-appearing or diaphoretic.  HENT:     Head: Normocephalic.  Eyes:     General: No scleral icterus.       Right eye: No discharge.        Left eye: No discharge.     Conjunctiva/sclera: Conjunctivae normal.  Cardiovascular:     Rate and Rhythm: Normal rate and regular rhythm.     Heart sounds: Normal heart sounds.  Pulmonary:     Effort: Pulmonary effort is normal. No respiratory distress.     Breath sounds: Normal breath sounds.  Musculoskeletal:        General: Normal range of motion.  Skin:    General: Skin is warm and dry.  Neurological:     General: No focal deficit present.     Mental Status: She is alert and oriented to person, place, and time. Mental status is at baseline.  Psychiatric:        Mood and Affect: Mood normal.        Behavior: Behavior normal.        Thought Content: Thought content normal.        Judgment: Judgment normal.    Lab Results  Component Value Date   HGBA1C 7.0 (H) 11/26/2022   HGBA1C 7.3 (H) 06/28/2022   HGBA1C 6.9 (H) 03/22/2022    Lab Results  Component Value Date   CREATININE 0.76 11/26/2022   CREATININE 0.78 06/28/2022   CREATININE 0.61 03/22/2022    Lab Results  Component Value Date   WBC 6.3 06/28/2022   HGB 14.5 06/28/2022   HCT 42.9 06/28/2022   PLT 260.0 06/28/2022   GLUCOSE 144 (H) 11/26/2022   CHOL 144 11/26/2022   TRIG 70.0 11/26/2022   HDL 39.20 11/26/2022   LDLDIRECT 93.0 11/26/2022   LDLCALC 90 11/26/2022   ALT 33 11/26/2022   AST 33 11/26/2022   NA 139 11/26/2022   K 3.9 11/26/2022   CL 105 11/26/2022   CREATININE 0.76 11/26/2022   BUN 12 11/26/2022   CO2 27 11/26/2022   TSH 1.18 06/28/2022    HGBA1C 7.0 (H) 11/26/2022   MICROALBUR <0.7 03/22/2022    No results found.  Assessment & Plan:  .Essential hypertension Assessment & Plan: Not at goal on losartan/hct. Max dose.  Adding amlodipine 2.5 mg daily in the evening   Orders: -     Comprehensive metabolic panel  Uncontrolled type 2 diabetes mellitus with hyperglycemia (HCC) -  Pioglitazone HCl; Take 1 tablet (15 mg total) by mouth daily.  Dispense: 90 tablet; Refill: 3 -     Hemoglobin A1c -     Comprehensive metabolic panel  Hyperlipidemia associated with type 2 diabetes mellitus (HCC) Assessment & Plan: LDL is not at goal on atorvastatin 20 mg daily.  Will increase dose to 40 mg daily .   Patient has resolved  microalbuminuria on losartan .     She has  no side effects and liver enzymes are normal    Lab Results  Component Value Date   HGBA1C 7.0 (H) 11/26/2022   Lab Results  Component Value Date   MICROALBUR <0.7 03/22/2022   MICROALBUR 3.6 (H) 03/17/2020   Lab Results  Component Value Date   CHOL 144 11/26/2022   HDL 39.20 11/26/2022   LDLCALC 90 11/26/2022   LDLDIRECT 93.0 11/26/2022   TRIG 70.0 11/26/2022   CHOLHDL 4 11/26/2022   Lab Results  Component Value Date   ALT 33 11/26/2022   AST 33 11/26/2022   ALKPHOS 94 11/26/2022   BILITOT 0.6 11/26/2022     Orders: -     Lipid panel -     LDL cholesterol, direct  Positive colorectal cancer screening using Cologuard test Assessment & Plan: Colonoscopy revealed a > 2 cm high grade dysplastic polyp in the sigmoid colon    Obesity, morbid, BMI 40.0-49.9 (HCC) Assessment & Plan: I have congratulated her in the lifestyle changes she is making and encouraged  Continued weight loss with goal of 4 lbs per month over the next 6 months using a low glycemic index diet and regular exercise a minimum of 5 days per week.     Obesity, diabetes, and hypertension syndrome (HCC) Assessment & Plan: Rybelsus was added at last visit .  She stopped it due  to constipation which has been addressed today.  She was advised to resume the medication   Lab Results  Component Value Date   HGBA1C 7.0 (H) 11/26/2022      Pure hypercholesterolemia -     Atorvastatin Calcium; Take 1 tablet (40 mg total) by mouth daily.  Dispense: 90 tablet; Refill: 1  Other orders -     glipiZIDE; Take 1 tablet (10 mg total) by mouth 2 (two) times daily before a meal.  Dispense: 180 tablet; Refill: 3 -     amLODIPine Besylate; Take 1 tablet (2.5 mg total) by mouth at bedtime.  Dispense: 90 tablet; Refill: 1     I provided 30 minutes of face-to-face time during this encounter reviewing patient's last visit with me, patient's  most recent visit with cardiology,  nephrology,  and neurology,  recent surgical and non surgical procedures, previous  labs and imaging studies, counseling on currently addressed issues,  and post visit ordering to diagnostics and therapeutics .   Follow-up: Return in about 3 months (around 02/26/2023) for follow up diabetes.   Sherlene Shams, MD

## 2022-11-26 NOTE — Assessment & Plan Note (Signed)
Rybelsus was added at last visit .  She stopped it due to constipation which has been addressed today.  She was advised to resume the medication   Lab Results  Component Value Date   HGBA1C 7.0 (H) 11/26/2022

## 2022-11-26 NOTE — Patient Instructions (Addendum)
You do not need to Resume rybelsus for the diabetes unless your A1c today is > 7.0   Your can use magnesium and stool softener (colace) every night if you resume Rybelsus .    Add ex lax  if needed every  3-4 days   Adding 2.5 mg amlodipine at night for BP control     For the hot flashes, try the following natural supplements:   Black Cohosh Flax seed Evening primrose Estroven (soy derived estrogen)

## 2022-11-26 NOTE — Assessment & Plan Note (Signed)
I have congratulated her in the lifestyle changes she is making and encouraged  Continued weight loss with goal of 4 lbs per month over the next 6 months using a low glycemic index diet and regular exercise a minimum of 5 days per week.

## 2022-11-26 NOTE — Assessment & Plan Note (Signed)
Colonoscopy revealed a > 2 cm high grade dysplastic polyp in the sigmoid colon

## 2022-11-27 MED ORDER — ATORVASTATIN CALCIUM 40 MG PO TABS
40.0000 mg | ORAL_TABLET | Freq: Every day | ORAL | 1 refills | Status: DC
Start: 2022-11-27 — End: 2022-11-28

## 2022-11-27 NOTE — Assessment & Plan Note (Signed)
LDL is not at goal on atorvastatin 20 mg daily.  Will increase dose to 40 mg daily .   Patient has resolved  microalbuminuria on losartan .     She has  no side effects and liver enzymes are normal    Lab Results  Component Value Date   HGBA1C 7.0 (H) 11/26/2022   Lab Results  Component Value Date   MICROALBUR <0.7 03/22/2022   MICROALBUR 3.6 (H) 03/17/2020   Lab Results  Component Value Date   CHOL 144 11/26/2022   HDL 39.20 11/26/2022   LDLCALC 90 11/26/2022   LDLDIRECT 93.0 11/26/2022   TRIG 70.0 11/26/2022   CHOLHDL 4 11/26/2022   Lab Results  Component Value Date   ALT 33 11/26/2022   AST 33 11/26/2022   ALKPHOS 94 11/26/2022   BILITOT 0.6 11/26/2022

## 2022-11-27 NOTE — Assessment & Plan Note (Signed)
Not at goal on losartan/hct. Max dose.  Adding amlodipine 2.5 mg daily in the evening

## 2022-11-28 ENCOUNTER — Telehealth: Payer: Self-pay | Admitting: Internal Medicine

## 2022-11-28 ENCOUNTER — Other Ambulatory Visit: Payer: Self-pay | Admitting: Internal Medicine

## 2022-11-28 DIAGNOSIS — E7800 Pure hypercholesterolemia, unspecified: Secondary | ICD-10-CM

## 2022-11-28 DIAGNOSIS — E1165 Type 2 diabetes mellitus with hyperglycemia: Secondary | ICD-10-CM

## 2022-11-28 MED ORDER — AMLODIPINE BESYLATE 2.5 MG PO TABS: 2.5000 mg | ORAL_TABLET | Freq: Every day | ORAL | 1 refills | Status: DC

## 2022-11-28 MED ORDER — GLIPIZIDE 10 MG PO TABS: 10.0000 mg | ORAL_TABLET | Freq: Two times a day (BID) | ORAL | 3 refills | Status: DC

## 2022-11-28 MED ORDER — ATORVASTATIN CALCIUM 40 MG PO TABS
40.0000 mg | ORAL_TABLET | Freq: Every day | ORAL | 1 refills | Status: DC
Start: 2022-11-28 — End: 2023-05-09

## 2022-11-28 MED ORDER — PIOGLITAZONE HCL 15 MG PO TABS
15.0000 mg | ORAL_TABLET | Freq: Every day | ORAL | 3 refills | Status: DC
Start: 2022-11-28 — End: 2023-11-25

## 2022-11-28 NOTE — Telephone Encounter (Signed)
Pt called in asking to speak to [facility]. Pt stated she was in office on 11/26/22, and all her meds needs to go to [facility] in order for her insurance to pay for it. She stated last 2 meds went to [facility]. Please advice.

## 2022-11-28 NOTE — Telephone Encounter (Signed)
All medications have been resent to [facility].

## 2022-12-03 ENCOUNTER — Telehealth: Payer: Self-pay

## 2022-12-03 NOTE — Telephone Encounter (Signed)
LMTCB in regards to lab results.  

## 2022-12-03 NOTE — Telephone Encounter (Signed)
-----   Message from Sherlene Shams sent at 11/30/2022  5:50 PM EDT ----- Your diabetes remains under excellent control  And your cholesterol and other labs are also normal. Please continue your current medications, including Rybelsus, . return in 6 months for follow up on diabetes and make sure you are seeing your eye doctor at least once a year for a dilated retina exam to monitor for diabetic retinopathy,. changes that can lead to blindness .   Regards,   Duncan Dull, MD

## 2022-12-06 ENCOUNTER — Telehealth: Payer: Self-pay | Admitting: Internal Medicine

## 2022-12-06 DIAGNOSIS — E1165 Type 2 diabetes mellitus with hyperglycemia: Secondary | ICD-10-CM

## 2022-12-06 MED ORDER — ONETOUCH ULTRA VI STRP
ORAL_STRIP | 1 refills | Status: AC
Start: 2022-12-06 — End: ?

## 2022-12-06 NOTE — Telephone Encounter (Signed)
Prescription Request  12/06/2022  LOV: 11/26/2022  What is the name of the medication or equipment? One touch test strips  Have you contacted your pharmacy to request a refill? No   Which pharmacy would you like this sent to? [facility]   Patient notified that their request is being sent to the clinical staff for review and that they should receive a response within 2 business days.   Please advise at [facility] [phone]

## 2022-12-06 NOTE — Telephone Encounter (Signed)
refilled 

## 2022-12-11 MED ORDER — ONETOUCH ULTRA VI STRP
ORAL_STRIP | 1 refills | Status: AC
Start: 2022-12-11 — End: ?

## 2022-12-11 NOTE — Telephone Encounter (Signed)
[facility] called wanting to know how many times the pt is testing

## 2022-12-11 NOTE — Telephone Encounter (Signed)
Sent with corrected instructions

## 2022-12-11 NOTE — Addendum Note (Signed)
Addended by: Kristie Cowman on: 12/11/2022 08:46 AM   Modules accepted: Orders

## 2023-02-15 ENCOUNTER — Other Ambulatory Visit: Payer: Self-pay | Admitting: Internal Medicine

## 2023-02-15 DIAGNOSIS — Z1231 Encounter for screening mammogram for malignant neoplasm of breast: Secondary | ICD-10-CM

## 2023-03-06 ENCOUNTER — Encounter: Payer: Self-pay | Admitting: *Deleted

## 2023-03-08 ENCOUNTER — Other Ambulatory Visit: Payer: Self-pay | Admitting: Internal Medicine

## 2023-03-08 DIAGNOSIS — I10 Essential (primary) hypertension: Secondary | ICD-10-CM

## 2023-03-14 ENCOUNTER — Ambulatory Visit
Admission: RE | Admit: 2023-03-14 | Discharge: 2023-03-14 | Disposition: A | Discharge status: Home / Self Care | Payer: BC Managed Care – PPO | Source: Ambulatory Visit | Attending: Internal Medicine | Admitting: Internal Medicine

## 2023-03-14 ENCOUNTER — Encounter: Payer: Self-pay | Admitting: *Deleted

## 2023-03-14 DIAGNOSIS — Z1231 Encounter for screening mammogram for malignant neoplasm of breast: Secondary | ICD-10-CM | POA: Diagnosis not present

## 2023-03-15 ENCOUNTER — Encounter
Admission: RE | Disposition: A | Discharge status: Home / Self Care | Payer: Self-pay | Source: Home / Self Care | Attending: Gastroenterology

## 2023-03-15 ENCOUNTER — Ambulatory Visit
Admission: RE | Admit: 2023-03-15 | Discharge: 2023-03-15 | Disposition: A | Discharge status: Home / Self Care | Payer: BC Managed Care – PPO | Attending: Gastroenterology | Admitting: Gastroenterology

## 2023-03-15 ENCOUNTER — Ambulatory Visit: Payer: BC Managed Care – PPO | Admitting: Anesthesiology

## 2023-03-15 DIAGNOSIS — D124 Benign neoplasm of descending colon: Secondary | ICD-10-CM | POA: Diagnosis not present

## 2023-03-15 DIAGNOSIS — K635 Polyp of colon: Secondary | ICD-10-CM | POA: Insufficient documentation

## 2023-03-15 DIAGNOSIS — Z6841 Body Mass Index (BMI) 40.0 and over, adult: Secondary | ICD-10-CM | POA: Diagnosis not present

## 2023-03-15 DIAGNOSIS — K5730 Diverticulosis of large intestine without perforation or abscess without bleeding: Secondary | ICD-10-CM | POA: Insufficient documentation

## 2023-03-15 DIAGNOSIS — M1990 Unspecified osteoarthritis, unspecified site: Secondary | ICD-10-CM | POA: Insufficient documentation

## 2023-03-15 DIAGNOSIS — D122 Benign neoplasm of ascending colon: Secondary | ICD-10-CM | POA: Insufficient documentation

## 2023-03-15 DIAGNOSIS — Z1211 Encounter for screening for malignant neoplasm of colon: Secondary | ICD-10-CM | POA: Diagnosis not present

## 2023-03-15 DIAGNOSIS — D123 Benign neoplasm of transverse colon: Secondary | ICD-10-CM | POA: Diagnosis not present

## 2023-03-15 DIAGNOSIS — F17200 Nicotine dependence, unspecified, uncomplicated: Secondary | ICD-10-CM | POA: Insufficient documentation

## 2023-03-15 DIAGNOSIS — Z860101 Personal history of adenomatous and serrated colon polyps: Secondary | ICD-10-CM | POA: Diagnosis not present

## 2023-03-15 DIAGNOSIS — Z79899 Other long term (current) drug therapy: Secondary | ICD-10-CM | POA: Diagnosis not present

## 2023-03-15 DIAGNOSIS — Z7984 Long term (current) use of oral hypoglycemic drugs: Secondary | ICD-10-CM | POA: Diagnosis not present

## 2023-03-15 DIAGNOSIS — I10 Essential (primary) hypertension: Secondary | ICD-10-CM | POA: Diagnosis not present

## 2023-03-15 DIAGNOSIS — D128 Benign neoplasm of rectum: Secondary | ICD-10-CM | POA: Insufficient documentation

## 2023-03-15 DIAGNOSIS — E119 Type 2 diabetes mellitus without complications: Secondary | ICD-10-CM | POA: Diagnosis not present

## 2023-03-15 DIAGNOSIS — K640 First degree hemorrhoids: Secondary | ICD-10-CM | POA: Insufficient documentation

## 2023-03-15 DIAGNOSIS — K649 Unspecified hemorrhoids: Secondary | ICD-10-CM | POA: Diagnosis not present

## 2023-03-15 DIAGNOSIS — Z09 Encounter for follow-up examination after completed treatment for conditions other than malignant neoplasm: Secondary | ICD-10-CM | POA: Diagnosis not present

## 2023-03-15 HISTORY — DX: Unspecified osteoarthritis, unspecified site: M19.90

## 2023-03-15 HISTORY — PX: COLONOSCOPY WITH PROPOFOL: SHX5780

## 2023-03-15 HISTORY — DX: Trigger finger, unspecified finger: M65.30

## 2023-03-15 LAB — GLUCOSE, CAPILLARY: Glucose-Capillary: 129 mg/dL — ABNORMAL HIGH (ref 70–99)

## 2023-03-15 SURGERY — COLONOSCOPY WITH PROPOFOL
Anesthesia: General

## 2023-03-15 MED ORDER — ROCURONIUM BROMIDE 10 MG/ML (PF) SYRINGE
PREFILLED_SYRINGE | INTRAVENOUS | Status: AC
  Filled 2023-03-15: qty 10

## 2023-03-15 MED ORDER — PROPOFOL 10 MG/ML IV BOLUS
INTRAVENOUS | Status: DC | PRN
  Administered 2023-03-15: 50 mg via INTRAVENOUS

## 2023-03-15 MED ORDER — ONDANSETRON HCL 4 MG/2ML IJ SOLN
INTRAMUSCULAR | Status: AC
Start: 2023-03-15 — End: ?
  Filled 2023-03-15: qty 4

## 2023-03-15 MED ORDER — LIDOCAINE HCL (PF) 2 % IJ SOLN
INTRAMUSCULAR | Status: AC
Start: 2023-03-15 — End: ?
  Filled 2023-03-15: qty 5

## 2023-03-15 MED ORDER — SODIUM CHLORIDE 0.9 % IV SOLN
INTRAVENOUS | Status: DC
  Administered 2023-03-15: 20 mL/h via INTRAVENOUS

## 2023-03-15 MED ORDER — PHENYLEPHRINE 80 MCG/ML (10ML) SYRINGE FOR IV PUSH (FOR BLOOD PRESSURE SUPPORT)
PREFILLED_SYRINGE | INTRAVENOUS | Status: AC
  Filled 2023-03-15: qty 20

## 2023-03-15 MED ORDER — PROPOFOL 500 MG/50ML IV EMUL
INTRAVENOUS | Status: DC | PRN
  Administered 2023-03-15: 75 ug/kg/min via INTRAVENOUS

## 2023-03-15 MED ORDER — DEXMEDETOMIDINE HCL IN NACL 80 MCG/20ML IV SOLN
INTRAVENOUS | Status: AC
  Filled 2023-03-15: qty 20

## 2023-03-15 MED ORDER — LIDOCAINE HCL (PF) 2 % IJ SOLN
INTRAMUSCULAR | Status: AC
  Filled 2023-03-15: qty 5

## 2023-03-15 MED ORDER — PHENYLEPHRINE 80 MCG/ML (10ML) SYRINGE FOR IV PUSH (FOR BLOOD PRESSURE SUPPORT)
PREFILLED_SYRINGE | INTRAVENOUS | Status: AC
  Filled 2023-03-15: qty 10

## 2023-03-15 MED ORDER — LIDOCAINE HCL (CARDIAC) PF 100 MG/5ML IV SOSY
PREFILLED_SYRINGE | INTRAVENOUS | Status: DC | PRN
  Administered 2023-03-15: 80 mg via INTRAVENOUS

## 2023-03-15 MED ORDER — PROPOFOL 1000 MG/100ML IV EMUL
INTRAVENOUS | Status: AC
  Filled 2023-03-15: qty 100

## 2023-03-15 MED ORDER — DEXMEDETOMIDINE HCL IN NACL 200 MCG/50ML IV SOLN
INTRAVENOUS | Status: DC | PRN
Start: 2023-03-15 — End: 2023-03-15
  Administered 2023-03-15: 20 ug via INTRAVENOUS

## 2023-03-15 NOTE — Anesthesia Postprocedure Evaluation (Signed)
Anesthesia Post Note  Patient: Katelyn Proctor  Procedure(s) Performed: COLONOSCOPY WITH PROPOFOL  Patient location during evaluation: PACU Anesthesia Type: General Level of consciousness: awake Pain management: pain level controlled Vital Signs Assessment: post-procedure vital signs reviewed and stable Respiratory status: spontaneous breathing Cardiovascular status: stable Anesthetic complications: no   No notable events documented.   Last Vitals:  Vitals:   03/15/23 1357 03/15/23 1407  BP: (!) 152/82 (!) 170/93  Pulse: 79 72  Resp: (!) 22 16  Temp: 36.4 C 36.4 C  SpO2: 100% 100%    Last Pain:  Vitals:   03/15/23 1407  TempSrc:   PainSc: 0-No pain                 VAN STAVEREN,Yoselin Amerman

## 2023-03-15 NOTE — Transfer of Care (Signed)
Immediate Anesthesia Transfer of Care Note  Patient: Katelyn Proctor  Procedure(s) Performed: COLONOSCOPY WITH PROPOFOL  Patient Location: PACU  Anesthesia Type:General  Level of Consciousness: sedated  Airway & Oxygen Therapy: Patient Spontanous Breathing  Post-op Assessment: Report given to RN and Post -op Vital signs reviewed and stable  Post vital signs: Reviewed and stable  Last Vitals:  Vitals Value Taken Time  BP 132/82 03/15/23 1347  Temp 36.4 C 03/15/23 1347  Pulse 76 03/15/23 1347  Resp 18 03/15/23 1347  SpO2 99 % 03/15/23 1347    Last Pain:  Vitals:   03/15/23 1347  TempSrc: Temporal  PainSc: Asleep         Complications: No notable events documented.

## 2023-03-15 NOTE — Op Note (Signed)
[facility] Patient Name: Katelyn Proctor Procedure Date: 03/15/2023 12:26 PM MRN: [id number] Account #: [id number] Date of Birth: 1960/09/24 Admit Type: Outpatient Age: 62 Room: [facility] ENDO ROOM 3 Gender: Female Note Status: Finalized Instrument Name: Nelda Marseille [id number] Procedure:             Colonoscopy Indications:           Surveillance: History of numerous (> 10) adenomas on                         last colonoscopy (< 3 yrs) Providers:             Eather Colas MD, MD Medicines:             Monitored Anesthesia Care Complications:         No immediate complications. Estimated blood loss:                         Minimal. Procedure:             Pre-Anesthesia Assessment:                        - Prior to the procedure, a History and Physical was                         performed, and patient medications and allergies were                         reviewed. The patient is competent. The risks and                         benefits of the procedure and the sedation options and                         risks were discussed with the patient. All questions                         were answered and informed consent was obtained.                         Patient identification and proposed procedure were                         verified by the physician, the nurse, the                         anesthesiologist, the anesthetist and the technician                         in the endoscopy suite. Mental Status Examination:                         alert and oriented. Airway Examination: normal                         oropharyngeal airway and neck mobility. Respiratory                         Examination: clear to auscultation. CV Examination:  normal. Prophylactic Antibiotics: The patient does not                         require prophylactic antibiotics. Prior                         Anticoagulants: The patient has taken no anticoagulant                          or antiplatelet agents. ASA Grade Assessment: III - A                         patient with severe systemic disease. After reviewing                         the risks and benefits, the patient was deemed in                         satisfactory condition to undergo the procedure. The                         anesthesia plan was to use monitored anesthesia care                         (MAC). Immediately prior to administration of                         medications, the patient was re-assessed for adequacy                         to receive sedatives. The heart rate, respiratory                         rate, oxygen saturations, blood pressure, adequacy of                         pulmonary ventilation, and response to care were                         monitored throughout the procedure. The physical                         status of the patient was re-assessed after the                         procedure.                        After obtaining informed consent, the colonoscope was                         passed under direct vision. Throughout the procedure,                         the patient's blood pressure, pulse, and oxygen                         saturations were monitored continuously. The  Colonoscope was introduced through the anus and                         advanced to the the cecum, identified by appendiceal                         orifice and ileocecal valve. The colonoscopy was                         performed without difficulty. The patient tolerated                         the procedure well. The quality of the bowel                         preparation was good. The ileocecal valve, appendiceal                         orifice, and rectum were photographed. Findings:      The perianal and digital rectal examinations were normal.      A 3 mm polyp was found in the ascending colon. The polyp was sessile.       The polyp was removed with a  cold snare. Resection and retrieval were       complete. Estimated blood loss was minimal.      A 2 mm polyp was found in the ascending colon. The polyp was sessile.       The polyp was removed with a jumbo cold forceps. Resection and retrieval       were complete. Estimated blood loss was minimal.      An 11 mm polyp was found in the mid transverse colon. The polyp was       sessile. The polyp was removed with a hot snare. Resection and retrieval       were complete. To prevent bleeding after the polypectomy, two hemostatic       clips were successfully placed. There was no bleeding during, or at the       end, of the procedure.      A 3 mm polyp was found in the descending colon. The polyp was sessile.       The polyp was removed with a cold snare. Resection and retrieval were       complete. Estimated blood loss was minimal.      Two sessile polyps were found in the rectum. The polyps were 1 to 2 mm       in size. These polyps were removed with a cold snare. Resection and       retrieval were complete. Estimated blood loss was minimal.      A few small-mouthed diverticula were found in the sigmoid colon.      Internal hemorrhoids were found during retroflexion. The hemorrhoids       were Grade I (internal hemorrhoids that do not prolapse).      The exam was otherwise without abnormality on direct and retroflexion       views. Impression:            - One 3 mm polyp in the ascending colon, removed with  a cold snare. Resected and retrieved.                        - One 2 mm polyp in the ascending colon, removed with                         a jumbo cold forceps. Resected and retrieved.                        - One 11 mm polyp in the mid transverse colon, removed                         with a hot snare. Resected and retrieved. Clips were                         placed.                        - One 3 mm polyp in the descending colon, removed with                          a cold snare. Resected and retrieved.                        - Two 1 to 2 mm polyps in the rectum, removed with a                         cold snare. Resected and retrieved.                        - Diverticulosis in the sigmoid colon.                        - Internal hemorrhoids.                        - The examination was otherwise normal on direct and                         retroflexion views. Recommendation:        - Discharge patient to home.                        - Resume previous diet.                        - Continue present medications.                        - Await pathology results.                        - Repeat colonoscopy in 1 year for surveillance.                         Consider genetic testing if total number of adenomas                         is > 20.                        -  Return to referring physician as previously                         scheduled. Procedure Code(s):     --- Professional ---                        [phone], Colonoscopy, flexible; with removal of                         tumor(s), polyp(s), or other lesion(s) by snare                         technique                        45380, 59, Colonoscopy, flexible; with biopsy, single                         or multiple Diagnosis Code(s):     --- Professional ---                        Z86.010, Personal history of colonic polyps                        D12.2, Benign neoplasm of ascending colon                        D12.3, Benign neoplasm of transverse colon (hepatic                         flexure or splenic flexure)                        D12.4, Benign neoplasm of descending colon                        D12.8, Benign neoplasm of rectum                        K64.0, First degree hemorrhoids                        K57.30, Diverticulosis of large intestine without                         perforation or abscess without bleeding CPT copyright 2022 [employer] Association. All rights  reserved. The codes documented in this report are preliminary and upon coder review may  be revised to meet current compliance requirements. Eather Colas MD, MD 03/15/2023 1:48:57 PM Number of Addenda: 0 Note Initiated On: 03/15/2023 12:26 PM Scope Withdrawal Time: 0 hours 15 minutes 52 seconds  Total Procedure Duration: 0 hours 21 minutes 59 seconds  Estimated Blood Loss:  Estimated blood loss was minimal.      [facility]

## 2023-03-15 NOTE — Anesthesia Preprocedure Evaluation (Addendum)
Anesthesia Evaluation  Patient identified by MRN, date of birth, ID band Patient awake    Reviewed: Allergy & Precautions, NPO status , Patient's Chart, lab work & pertinent test results  Airway Mallampati: III  TM Distance: >3 FB Neck ROM: Full    Dental  (+) Teeth Intact   Pulmonary Current Smoker and Patient abstained from smoking.    + decreased breath sounds      Cardiovascular Exercise Tolerance: Good hypertension, Pt. on medications Normal cardiovascular exam Rhythm:Regular Rate:Normal     Neuro/Psych negative neurological ROS  negative psych ROS   GI/Hepatic Neg liver ROS,GERD  Medicated,,  Endo/Other  diabetes, Type 2, Oral Hypoglycemic Agents  Morbid obesity  Renal/GU negative Renal ROS  negative genitourinary   Musculoskeletal  (+) Arthritis ,    Abdominal  (+) + obese  Peds negative pediatric ROS (+)  Hematology negative hematology ROS (+) Blood dyscrasia, anemia   Anesthesia Other Findings Past Medical History: 12/16/2019: Abnormal iron saturation No date: Anemia No date: Chronic sinusitis No date: Diabetes mellitus without complication (HCC) No date: GERD (gastroesophageal reflux disease) No date: Hyperlipidemia No date: Hypertension No date: Tobacco abuse  Past Surgical History: 02/20/2018: BREAST BIOPSY; Right     Comment:  top hat, neg No date: CESAREAN SECTION 2003: NASAL SINUS SURGERY 1995: TUBAL LIGATION     Reproductive/Obstetrics negative OB ROS                             Anesthesia Physical Anesthesia Plan  ASA: 3  Anesthesia Plan: General   Post-op Pain Management:    Induction: Intravenous  PONV Risk Score and Plan: Propofol infusion and TIVA  Airway Management Planned: Natural Airway  Additional Equipment:   Intra-op Plan:   Post-operative Plan:   Informed Consent: I have reviewed the patients History and Physical, chart, labs and  discussed the procedure including the risks, benefits and alternatives for the proposed anesthesia with the patient or authorized representative who has indicated his/her understanding and acceptance.     Dental Advisory Given  Plan Discussed with: CRNA and Surgeon  Anesthesia Plan Comments:        Anesthesia Quick Evaluation

## 2023-03-15 NOTE — H&P (Signed)
Outpatient short stay form Pre-procedure 03/15/2023  Regis Bill, MD  Primary Physician: Sherlene Shams, MD  Reason for visit:  Surveillance colonoscopy  History of present illness:    62 y/o lady with history of hypertension, DM II, and obesity here for colonoscopy due to multiple polyps on recent colonoscopy that could not be removed at one session. No family history of GI malignancies. No blood thinners. History of c-section. No new GI symptoms.     Current Facility-Administered Medications:    0.9 %  sodium chloride infusion, , Intravenous, Continuous, Joseandres Mazer, Rossie Muskrat, MD, Last Rate: 20 mL/hr at 03/15/23 1155, 20 mL/hr at 03/15/23 1155  Medications Prior to Admission  Medication Sig Dispense Refill Last Dose   amLODipine (NORVASC) 2.5 MG tablet Take 1 tablet (2.5 mg total) by mouth at bedtime. 90 tablet 1 03/14/2023   aspirin EC 81 MG tablet Take 81 mg by mouth daily. Swallow whole.   Past Week   atorvastatin (LIPITOR) 40 MG tablet Take 1 tablet (40 mg total) by mouth daily. 90 tablet 1 03/14/2023   Blood Glucose Monitoring Suppl (ONETOUCH VERIO FLEX SYSTEM) w/Device KIT Use to check blood sugar up to TID. 1 kit 3 03/14/2023   celecoxib (CELEBREX) 200 MG capsule Take 200 mg by mouth daily.   Past Week   glipiZIDE (GLUCOTROL) 10 MG tablet Take 1 tablet (10 mg total) by mouth 2 (two) times daily before a meal. 180 tablet 3 Past Week   glucose blood (ONETOUCH ULTRA) test strip Use to check blood sugars three times daily 300 strip 1 03/14/2023   JANUVIA 100 MG tablet TAKE 1 TABLET DAILY 90 tablet 3 Past Week   Lancets (ONETOUCH ULTRASOFT) lancets Use as instructed to check sugars up to TID 300 each 3 03/14/2023   losartan-hydrochlorothiazide (HYZAAR) 100-25 MG tablet TAKE 1 TABLET DAILY 90 tablet 3 03/14/2023   meloxicam (MOBIC) 15 MG tablet meloxicam 15 mg tablet  Take 1 tablet every day by oral route.   Past Month   pioglitazone (ACTOS) 15 MG tablet Take 1 tablet (15 mg  total) by mouth daily. 90 tablet 3 Past Week   Cholecalciferol (VITAMIN D) 50 MCG (2000 UT) CAPS Take 1,000 Units by mouth daily.       Semaglutide (RYBELSUS) 3 MG TABS Take 3 mg by mouth daily. (Patient not taking: Reported on 03/15/2023)   Completed Course     Allergies  Allergen Reactions   Septra Ds [Sulfamethoxazole-Trimethoprim] Hives     Past Medical History:  Diagnosis Date   Abnormal iron saturation 12/16/2019   Anemia    Arthritis    Chronic sinusitis    Diabetes mellitus without complication (HCC)    DJD (degenerative joint disease)    GERD (gastroesophageal reflux disease)    Hyperlipidemia    Hypertension    Tobacco abuse    Trigger finger of left hand     Review of systems:  Otherwise negative.    Physical Exam  Gen: Alert, oriented. Appears stated age.  HEENT: PERRLA. Lungs: No respiratory distress CV: RRR Abd: soft, benign, no masses Ext: No edema    Planned procedures: Proceed with colonoscopy. The patient understands the nature of the planned procedure, indications, risks, alternatives and potential complications including but not limited to bleeding, infection, perforation, damage to internal organs and possible oversedation/side effects from anesthesia. The patient agrees and gives consent to proceed.  Please refer to procedure notes for findings, recommendations and patient disposition/instructions.  Regis Bill, MD [facility]

## 2023-03-15 NOTE — Interval H&P Note (Signed)
History and Physical Interval Note:  03/15/2023 12:55 PM  Katelyn Proctor  has presented today for surgery, with the diagnosis of PH Colon Polyps.  The various methods of treatment have been discussed with the patient and family. After consideration of risks, benefits and other options for treatment, the patient has consented to  Procedure(s): COLONOSCOPY WITH PROPOFOL (N/A) as a surgical intervention.  The patient's history has been reviewed, patient examined, no change in status, stable for surgery.  I have reviewed the patient's chart and labs.  Questions were answered to the patient's satisfaction.     Regis Bill  Ok to proceed with colonoscopy

## 2023-03-18 ENCOUNTER — Encounter: Payer: Self-pay | Admitting: Gastroenterology

## 2023-03-18 LAB — SURGICAL PATHOLOGY

## 2023-05-09 ENCOUNTER — Other Ambulatory Visit: Payer: Self-pay | Admitting: Internal Medicine

## 2023-05-09 DIAGNOSIS — E7800 Pure hypercholesterolemia, unspecified: Secondary | ICD-10-CM

## 2023-06-20 ENCOUNTER — Encounter: Payer: Self-pay | Admitting: *Deleted

## 2023-07-29 ENCOUNTER — Telehealth: Payer: Self-pay

## 2023-07-29 DIAGNOSIS — E1169 Type 2 diabetes mellitus with other specified complication: Secondary | ICD-10-CM

## 2023-07-29 DIAGNOSIS — I152 Hypertension secondary to endocrine disorders: Secondary | ICD-10-CM

## 2023-07-29 NOTE — Addendum Note (Signed)
 Addended by: Sherlene Shams on: 07/29/2023 05:38 PM   Modules accepted: Orders

## 2023-07-29 NOTE — Telephone Encounter (Signed)
 I have pended labs for your approval.

## 2023-07-29 NOTE — Telephone Encounter (Signed)
 Copied from CRM [id number]. Topic: Clinical - Request for Lab/Test Order >> Jul 29, 2023  3:37 PM Isabell A wrote: Reason for CRM: Patient states she would like to have her labs completed - no orders on file. Requesting a call back to schedule labs and follow up visit.

## 2023-07-29 NOTE — Addendum Note (Signed)
 Addended by: Sandy Salaam on: 07/29/2023 03:49 PM   Modules accepted: Orders

## 2023-07-30 ENCOUNTER — Telehealth: Payer: Self-pay | Admitting: Internal Medicine

## 2023-07-30 NOTE — Telephone Encounter (Signed)
 Have you  decided to forgo the lung cancer screening?  Please return the calls from the pulmonary clinic if not.  They have been trying to reach you  She is also overdue for diabetes follow up including repeat urinalysis to monitor kidney function.  Labs are ordered

## 2023-07-30 NOTE — Telephone Encounter (Signed)
 Called patient to follow up with Dr Melina Schools recommendations. Unable to leave voice message as the voicemail box is full.  OK to relay message if patient calls back. If relayed, please notify the office.

## 2023-07-31 NOTE — Telephone Encounter (Signed)
 Pt called back and scheduled a follow up appt with Dr. Darrick Huntsman.

## 2023-08-29 ENCOUNTER — Encounter: Payer: Self-pay | Admitting: Internal Medicine

## 2023-08-29 ENCOUNTER — Ambulatory Visit: Admitting: Internal Medicine

## 2023-08-29 VITALS — BP 154/86 | HR 73 | Ht 62.0 in | Wt 243.4 lb

## 2023-08-29 DIAGNOSIS — E785 Hyperlipidemia, unspecified: Secondary | ICD-10-CM

## 2023-08-29 DIAGNOSIS — D126 Benign neoplasm of colon, unspecified: Secondary | ICD-10-CM

## 2023-08-29 DIAGNOSIS — I152 Hypertension secondary to endocrine disorders: Secondary | ICD-10-CM | POA: Diagnosis not present

## 2023-08-29 DIAGNOSIS — E1159 Type 2 diabetes mellitus with other circulatory complications: Secondary | ICD-10-CM

## 2023-08-29 DIAGNOSIS — E669 Obesity, unspecified: Secondary | ICD-10-CM

## 2023-08-29 DIAGNOSIS — E1169 Type 2 diabetes mellitus with other specified complication: Secondary | ICD-10-CM | POA: Diagnosis not present

## 2023-08-29 DIAGNOSIS — E1165 Type 2 diabetes mellitus with hyperglycemia: Secondary | ICD-10-CM

## 2023-08-29 LAB — LIPID PANEL
Cholesterol: 129 mg/dL (ref 0–200)
HDL: 37.6 mg/dL — ABNORMAL LOW (ref >39.00)
LDL Cholesterol: 78 mg/dL (ref 0–99)
NonHDL: 91.52
Total CHOL/HDL Ratio: 3
Triglycerides: 67 mg/dL (ref 0.0–149.0)
VLDL: 13.4 mg/dL (ref 0.0–40.0)

## 2023-08-29 LAB — COMPREHENSIVE METABOLIC PANEL WITH GFR
ALT: 50 U/L — ABNORMAL HIGH (ref 0–35)
AST: 41 U/L — ABNORMAL HIGH (ref 0–37)
Albumin: 4 g/dL (ref 3.5–5.2)
Alkaline Phosphatase: 95 U/L (ref 39–117)
BUN: 11 mg/dL (ref 6–23)
CO2: 30 meq/L (ref 19–32)
Calcium: 9.8 mg/dL (ref 8.4–10.5)
Chloride: 104 meq/L (ref 96–112)
Creatinine, Ser: 0.83 mg/dL (ref 0.40–1.20)
GFR: 74.69 mL/min (ref >60.00)
Glucose, Bld: 175 mg/dL — ABNORMAL HIGH (ref 70–99)
Potassium: 4.4 meq/L (ref 3.5–5.1)
Sodium: 139 meq/L (ref 135–145)
Total Bilirubin: 0.6 mg/dL (ref 0.2–1.2)
Total Protein: 7.5 g/dL (ref 6.0–8.3)

## 2023-08-29 LAB — POCT GLYCOSYLATED HEMOGLOBIN (HGB A1C): Hemoglobin A1C: 7.6 % — AB (ref 4.0–5.6)

## 2023-08-29 LAB — MICROALBUMIN / CREATININE URINE RATIO
Creatinine,U: 156.7 mg/dL
Microalb Creat Ratio: 7.7 mg/g (ref 0.0–30.0)
Microalb, Ur: 1.2 mg/dL (ref 0.0–1.9)

## 2023-08-29 LAB — CBC WITH DIFFERENTIAL/PLATELET
Basophils Absolute: 0 10*3/uL (ref 0.0–0.1)
Basophils Relative: 0.5 % (ref 0.0–3.0)
Eosinophils Absolute: 0 10*3/uL (ref 0.0–0.7)
Eosinophils Relative: 0.7 % (ref 0.0–5.0)
HCT: 42 % (ref 36.0–46.0)
Hemoglobin: 14 g/dL (ref 12.0–15.0)
Lymphocytes Relative: 29.8 % (ref 12.0–46.0)
Lymphs Abs: 1.6 10*3/uL (ref 0.7–4.0)
MCHC: 33.2 g/dL (ref 30.0–36.0)
MCV: 94.2 fl (ref 78.0–100.0)
Monocytes Absolute: 0.4 10*3/uL (ref 0.1–1.0)
Monocytes Relative: 8.2 % (ref 3.0–12.0)
Neutro Abs: 3.3 10*3/uL (ref 1.4–7.7)
Neutrophils Relative %: 60.8 % (ref 43.0–77.0)
Platelets: 268 10*3/uL (ref 150.0–400.0)
RBC: 4.46 Mil/uL (ref 3.87–5.11)
RDW: 13.5 % (ref 11.5–15.5)
WBC: 5.4 10*3/uL (ref 4.0–10.5)

## 2023-08-29 LAB — HM DIABETES EYE EXAM

## 2023-08-29 LAB — TSH: TSH: 1 u[IU]/mL (ref 0.35–5.50)

## 2023-08-29 LAB — LDL CHOLESTEROL, DIRECT: Direct LDL: 86 mg/dL

## 2023-08-29 MED ORDER — EMPAGLIFLOZIN 10 MG PO TABS: 10.0000 mg | ORAL_TABLET | Freq: Every day | ORAL | 1 refills | Status: DC

## 2023-08-29 MED ORDER — AMLODIPINE BESYLATE 2.5 MG PO TABS: 2.5000 mg | ORAL_TABLET | Freq: Every day | ORAL | 0 refills | Status: DC

## 2023-08-29 MED ORDER — SITAGLIPTIN PHOSPHATE 100 MG PO TABS: 100.0000 mg | ORAL_TABLET | Freq: Every day | ORAL | 0 refills | Status: DC

## 2023-08-29 NOTE — Progress Notes (Signed)
 Subjective:  Patient ID: Katelyn Proctor, female    DOB: 05/06/1961  Age: 63 y.o. MRN: [id number]  CC: The primary encounter diagnosis was Obesity, diabetes, and hypertension syndrome (HCC). Diagnoses of Tubular adenoma of colon, Uncontrolled type 2 diabetes mellitus with hyperglycemia (HCC), Hyperlipidemia associated with type 2 diabetes mellitus (HCC), and Obesity, morbid, BMI 40.0-49.9 (HCC) were also pertinent to this visit.   HPI Katelyn Proctor presents for  Chief Complaint  Patient presents with   Medical Management of Chronic Issues    diabetes   1) Type 2 DM:  She  feels generally well, is walking daily at the track and   trying to lose weight. Has gained 11 lbs despite changing her diet In January   she gained weight after retiring and reports that her clothes are getting more loose bu she is not weighing. she has been travelling.  Checking  blood sugars less than once daily at variable times, usually only if she feels she may be having a hypoglycemic event. .  BS have been under 130 fasting and < 150 post prandially.  Denies any recent hypoglyemic events.  Taking   medications as directed. Following a carbohydrate modified diet 6 days per week. Denies numbness, burning and tingling of extremities. Appetite is good.    A1c has risen to 7.6 .  Taking max  dose of glipizide  ,  actos  and januvia .  .  She states that she is eating potatoes several times per week.  No bread or pasta,  lots of crackers with peanut butter  2) HTN:  stopped amlodipine  "because it made me feel funny"    still taking losartan /hct.    Outpatient Medications Prior to Visit  Medication Sig Dispense Refill   aspirin  EC 81 MG tablet Take 81 mg by mouth daily. Swallow whole.     atorvastatin  (LIPITOR) 40 MG tablet TAKE 1 TABLET DAILY 90 tablet 3   Blood Glucose Monitoring Suppl (ONETOUCH VERIO FLEX SYSTEM) w/Device KIT Use to check blood sugar up to TID. 1 kit 3   Cholecalciferol (VITAMIN D) 50 MCG (2000 UT) CAPS  Take 1,000 Units by mouth daily.      glipiZIDE  (GLUCOTROL ) 10 MG tablet Take 1 tablet (10 mg total) by mouth 2 (two) times daily before a meal. 180 tablet 3   glucose blood (ONETOUCH ULTRA) test strip Use to check blood sugars three times daily 300 strip 1   Lancets (ONETOUCH ULTRASOFT) lancets Use as instructed to check sugars up to TID 300 each 3   losartan -hydrochlorothiazide (HYZAAR) 100-25 MG tablet TAKE 1 TABLET DAILY 90 tablet 3   pioglitazone  (ACTOS ) 15 MG tablet Take 1 tablet (15 mg total) by mouth daily. 90 tablet 3   JANUVIA  100 MG tablet TAKE 1 TABLET DAILY 90 tablet 3   amLODipine  (NORVASC ) 2.5 MG tablet Take 1 tablet (2.5 mg total) by mouth at bedtime. (Patient not taking: Reported on 08/29/2023) 90 tablet 1   celecoxib (CELEBREX) 200 MG capsule Take 200 mg by mouth daily. (Patient not taking: Reported on 08/29/2023)     meloxicam (MOBIC) 15 MG tablet meloxicam 15 mg tablet  Take 1 tablet every day by oral route. (Patient not taking: Reported on 08/29/2023)     Semaglutide  (RYBELSUS ) 3 MG TABS Take 3 mg by mouth daily. (Patient not taking: Reported on 03/15/2023)     No facility-administered medications prior to visit.    Review of Systems;  Patient denies headache, fevers, malaise, unintentional weight  loss, skin rash, eye pain, sinus congestion and sinus pain, sore throat, dysphagia,  hemoptysis , cough, dyspnea, wheezing, chest pain, palpitations, orthopnea, edema, abdominal pain, nausea, melena, diarrhea, constipation, flank pain, dysuria, hematuria, urinary  Frequency, nocturia, numbness, tingling, seizures,  Focal weakness, Loss of consciousness,  Tremor, insomnia, depression, anxiety, and suicidal ideation.      Objective:  BP (!) 154/86   Pulse 73   Ht 5\' 2"  (1.575 m)   Wt 243 lb 6.4 oz (110.4 kg)   SpO2 95%   BMI 44.52 kg/m   BP Readings from Last 3 Encounters:  08/29/23 (!) 154/86  03/15/23 (!) 170/93  11/26/22 (!) 148/76    Wt Readings from Last 3  Encounters:  08/29/23 243 lb 6.4 oz (110.4 kg)  03/15/23 243 lb 12.8 oz (110.6 kg)  11/26/22 232 lb 9.6 oz (105.5 kg)    Physical Exam Vitals reviewed.  Constitutional:      General: She is not in acute distress.    Appearance: Normal appearance. She is normal weight. She is not ill-appearing, toxic-appearing or diaphoretic.  HENT:     Head: Normocephalic.  Eyes:     General: No scleral icterus.       Right eye: No discharge.        Left eye: No discharge.     Conjunctiva/sclera: Conjunctivae normal.  Cardiovascular:     Rate and Rhythm: Normal rate and regular rhythm.     Heart sounds: Normal heart sounds.  Pulmonary:     Effort: Pulmonary effort is normal. No respiratory distress.     Breath sounds: Normal breath sounds.  Musculoskeletal:        General: Normal range of motion.  Skin:    General: Skin is warm and dry.  Neurological:     General: No focal deficit present.     Mental Status: She is alert and oriented to person, place, and time. Mental status is at baseline.  Psychiatric:        Mood and Affect: Mood normal.        Behavior: Behavior normal.        Thought Content: Thought content normal.        Judgment: Judgment normal.    Lab Results  Component Value Date   HGBA1C 7.6 (A) 08/29/2023   HGBA1C 7.0 (H) 11/26/2022   HGBA1C 7.3 (H) 06/28/2022    Lab Results  Component Value Date   CREATININE 0.83 08/29/2023   CREATININE 0.76 11/26/2022   CREATININE 0.78 06/28/2022    Lab Results  Component Value Date   WBC 5.4 08/29/2023   HGB 14.0 08/29/2023   HCT 42.0 08/29/2023   PLT 268.0 08/29/2023   GLUCOSE 175 (H) 08/29/2023   CHOL 129 08/29/2023   TRIG 67.0 08/29/2023   HDL 37.60 (L) 08/29/2023   LDLDIRECT 86.0 08/29/2023   LDLCALC 78 08/29/2023   ALT 50 (H) 08/29/2023   AST 41 (H) 08/29/2023   NA 139 08/29/2023   K 4.4 08/29/2023   CL 104 08/29/2023   CREATININE 0.83 08/29/2023   BUN 11 08/29/2023   CO2 30 08/29/2023   TSH 1.00 08/29/2023    HGBA1C 7.6 (A) 08/29/2023   MICROALBUR 1.2 08/29/2023    No results found.  Assessment & Plan:  .Obesity, diabetes, and hypertension syndrome (HCC) -     POCT glycosylated hemoglobin (Hb A1C) -     Microalbumin / creatinine urine ratio -     TSH -  CBC with Differential/Platelet -     Comprehensive metabolic panel with GFR  Tubular adenoma of colon  Uncontrolled type 2 diabetes mellitus with hyperglycemia (HCC) -     SITagliptin  Phosphate; Take 1 tablet (100 mg total) by mouth daily.  Dispense: 30 tablet; Refill: 0  Hyperlipidemia associated with type 2 diabetes mellitus (HCC) Assessment & Plan: A1c has risen despite  3 drug therapy and dietary changes reported by patient.  She has deferred GLP 1 agonist therapy.  Will add Jardiance  and continue gliizide. Actos  and januvia .  LDL is closer to goal  on atorvastatin   40 mg daily .   Patient has resolved  microalbuminuria on losartan  .     She has  no side effects and liver enzymes are normal .  She will return to discuss    Lab Results  Component Value Date   HGBA1C 7.6 (A) 08/29/2023   Lab Results  Component Value Date   MICROALBUR 1.2 08/29/2023   MICROALBUR <0.7 03/22/2022   Lab Results  Component Value Date   CHOL 129 08/29/2023   HDL 37.60 (L) 08/29/2023   LDLCALC 78 08/29/2023   LDLDIRECT 86.0 08/29/2023   TRIG 67.0 08/29/2023   CHOLHDL 3 08/29/2023   Lab Results  Component Value Date   ALT 50 (H) 08/29/2023   AST 41 (H) 08/29/2023   ALKPHOS 95 08/29/2023   BILITOT 0.6 08/29/2023     Orders: -     LDL cholesterol, direct -     Lipid panel  Obesity, morbid, BMI 40.0-49.9 (HCC) Assessment & Plan: She has gained weight despite taking a "challenge" from her niece.  Encouraged to continue limiting her carbodydrates and fat and to increase her exercise    Other orders -     Empagliflozin ; Take 1 tablet (10 mg total) by mouth daily before breakfast.  Dispense: 30 tablet; Refill: 1 -     amLODIPine   Besylate; Take 1 tablet (2.5 mg total) by mouth at bedtime.  Dispense: 30 tablet; Refill: 0     I spent 34 minutes on the day of this face to face encounter reviewing patient's  prior relevant surgical and non surgical procedures, recent  labs and imaging studies, counseling on diabetes and weight management,  reviewing the assessment and plan with patient, and post visit ordering and reviewing of  diagnostics and therapeutics with patient  .   Follow-up: Return in about 3 months (around 11/28/2023) for follow up diabetes.   Thersia Flax, MD

## 2023-08-29 NOTE — Patient Instructions (Addendum)
 I'm very impressed with your lifestyle changes!    1)  however, Your A1c has risen to 7.6 !  I am adding a 4th medication called jardiance to your current regimen of januvia,  glipizide  and actos . Take it once daily in the morning .  Use the list I gave you of "log GI (low carb) foods   CUT BACK ON  peanut butter and crackers .  Too my calories and too many carbs ONLY EAT FRUITS FROM THE LOW OR MEDIUM LIST   You need to add protein to your breakfast  ( CHICKEN,  CHEESE)  or drink a protein drink:  Recommended Low Carb high Protein premixed Shakes:   Premier Protein  Atkins Advantage Muscle Milk EAS AdvantEdge   All of these are available at [employer], [employer],  [employer], and [employer]  And taste good   2)  YOUR BLOOD PRESSURE IS TOO HIGH.  PLEASE RESUME AMLODIPINE AT BEDTIME AND CONTINUE LOSARTAN/HCT

## 2023-08-30 ENCOUNTER — Telehealth: Payer: Self-pay | Admitting: Internal Medicine

## 2023-08-30 NOTE — Telephone Encounter (Signed)
 Copied from CRM [id number]. Topic: Clinical - Prescription Issue >> Aug 30, 2023  1:00 PM Drema MATSU wrote: Reason for CRM: Patient states that the wrong medication was sent in the mail yesterday. She said she already get Januvia  through the mail. She wants to let Dr. Marylynn know that she doesn't need pioglitazone  (ACTOS ) 15 MG tablet either because it has came in the mail yesterday and wants to let her know that the wrong order was sent.

## 2023-09-01 NOTE — Assessment & Plan Note (Signed)
 She has gained weight despite taking a "challenge" from her niece.  Encouraged to continue limiting her carbodydrates and fat and to increase her exercise

## 2023-09-01 NOTE — Assessment & Plan Note (Addendum)
 A1c has risen despite  3 drug therapy and dietary changes reported by patient.  She has deferred GLP 1 agonist therapy.  Will add Jardiance  and continue gliizide. Actos  and januvia .  LDL is closer to goal  on atorvastatin   40 mg daily .   Patient has resolved  microalbuminuria on losartan  .     She has  no side effects and liver enzymes are normal .  She will return to discuss    Lab Results  Component Value Date   HGBA1C 7.6 (A) 08/29/2023   Lab Results  Component Value Date   MICROALBUR 1.2 08/29/2023   MICROALBUR <0.7 03/22/2022   Lab Results  Component Value Date   CHOL 129 08/29/2023   HDL 37.60 (L) 08/29/2023   LDLCALC 78 08/29/2023   LDLDIRECT 86.0 08/29/2023   TRIG 67.0 08/29/2023   CHOLHDL 3 08/29/2023   Lab Results  Component Value Date   ALT 50 (H) 08/29/2023   AST 41 (H) 08/29/2023   ALKPHOS 95 08/29/2023   BILITOT 0.6 08/29/2023

## 2023-09-02 NOTE — Telephone Encounter (Signed)
 LMTCB

## 2023-09-09 NOTE — Telephone Encounter (Signed)
 LMTCB

## 2023-09-16 ENCOUNTER — Other Ambulatory Visit: Payer: Self-pay | Admitting: Internal Medicine

## 2023-09-16 DIAGNOSIS — E1165 Type 2 diabetes mellitus with hyperglycemia: Secondary | ICD-10-CM

## 2023-09-17 NOTE — Telephone Encounter (Signed)
 Attempted to call. No answer mailbox is full.

## 2023-10-09 ENCOUNTER — Telehealth: Payer: Self-pay

## 2023-10-09 NOTE — Telephone Encounter (Signed)
 LMTCB

## 2023-10-09 NOTE — Telephone Encounter (Signed)
 Copied from CRM [id number]. Topic: Clinical - Medical Advice >> Oct 09, 2023  1:12 PM Alyse July wrote: Reason for CRM: Patient would like to inform the provider/nurse that she stop taking her medication empagliflozin  (JARDIANCE ) 10 MG TABS tablet because it was causing her to itch. Please contact patient to advise. [phone]

## 2023-10-16 NOTE — Telephone Encounter (Signed)
 Attempted to call pt. Mail box is full.

## 2023-11-25 ENCOUNTER — Other Ambulatory Visit: Payer: Self-pay | Admitting: Internal Medicine

## 2023-11-25 DIAGNOSIS — E1165 Type 2 diabetes mellitus with hyperglycemia: Secondary | ICD-10-CM

## 2023-12-09 ENCOUNTER — Encounter: Payer: Self-pay | Admitting: Internal Medicine

## 2023-12-09 ENCOUNTER — Ambulatory Visit (INDEPENDENT_AMBULATORY_CARE_PROVIDER_SITE_OTHER): Admitting: Internal Medicine

## 2023-12-09 VITALS — BP 118/62 | HR 79 | Temp 98.0°F | Ht 62.0 in | Wt 233.6 lb

## 2023-12-09 DIAGNOSIS — E1169 Type 2 diabetes mellitus with other specified complication: Secondary | ICD-10-CM

## 2023-12-09 DIAGNOSIS — Z7984 Long term (current) use of oral hypoglycemic drugs: Secondary | ICD-10-CM

## 2023-12-09 DIAGNOSIS — I152 Hypertension secondary to endocrine disorders: Secondary | ICD-10-CM | POA: Diagnosis not present

## 2023-12-09 DIAGNOSIS — Z716 Tobacco abuse counseling: Secondary | ICD-10-CM

## 2023-12-09 DIAGNOSIS — E669 Obesity, unspecified: Secondary | ICD-10-CM

## 2023-12-09 DIAGNOSIS — E1159 Type 2 diabetes mellitus with other circulatory complications: Secondary | ICD-10-CM

## 2023-12-09 DIAGNOSIS — R748 Abnormal levels of other serum enzymes: Secondary | ICD-10-CM | POA: Diagnosis not present

## 2023-12-09 DIAGNOSIS — E785 Hyperlipidemia, unspecified: Secondary | ICD-10-CM

## 2023-12-09 DIAGNOSIS — Z720 Tobacco use: Secondary | ICD-10-CM

## 2023-12-09 DIAGNOSIS — D126 Benign neoplasm of colon, unspecified: Secondary | ICD-10-CM

## 2023-12-09 LAB — COMPREHENSIVE METABOLIC PANEL WITH GFR
ALT: 29 U/L (ref 0–35)
AST: 30 U/L (ref 0–37)
Albumin: 4.1 g/dL (ref 3.5–5.2)
Alkaline Phosphatase: 88 U/L (ref 39–117)
BUN: 16 mg/dL (ref 6–23)
CO2: 30 meq/L (ref 19–32)
Calcium: 10 mg/dL (ref 8.4–10.5)
Chloride: 104 meq/L (ref 96–112)
Creatinine, Ser: 0.74 mg/dL (ref 0.40–1.20)
GFR: 85.55 mL/min (ref >60.00)
Glucose, Bld: 74 mg/dL (ref 70–99)
Potassium: 4.2 meq/L (ref 3.5–5.1)
Sodium: 140 meq/L (ref 135–145)
Total Bilirubin: 0.6 mg/dL (ref 0.2–1.2)
Total Protein: 7.4 g/dL (ref 6.0–8.3)

## 2023-12-09 LAB — POCT GLYCOSYLATED HEMOGLOBIN (HGB A1C): Hemoglobin A1C: 6.3 % — AB (ref 4.0–5.6)

## 2023-12-09 MED ORDER — AMLODIPINE BESYLATE 2.5 MG PO TABS: 2.5000 mg | ORAL_TABLET | Freq: Every day | ORAL | 1 refills | Status: DC

## 2023-12-09 NOTE — Progress Notes (Signed)
 Subjective:  Patient ID: Katelyn Proctor, female    DOB: 1961-01-23  Age: 63 y.o. MRN: [id number]  CC: The primary encounter diagnosis was Obesity, diabetes, and hypertension syndrome (HCC). Diagnoses of Hyperlipidemia associated with type 2 diabetes mellitus (HCC), Elevated liver enzymes, Tubular adenoma of colon, Tobacco abuse counseling, and Tobacco abuse were also pertinent to this visit.   HPI [name] presents for  Chief Complaint  Patient presents with   Medical Management of Chronic Issues    3 month f/u on diabetes    Follow up on uncontrolled  type 2 diabetes and HTN  2) T2DM:  .  Managed without insulin  per patient preference with 4 medications:  had to stop jardiance  due to recurrent vaginitis .(Chart was not updated a time of call in May 28)  Max dose glipizide  ,  max dose januvia  and low dose Actos . She is not checking her BS and declines the CBG monitor.  Diet is  low glycemic index   2 servings of starch per day max;   she notes that she eats a lot of  nuts.   3) right sided maxillary tooth abscess  .  Currently on PCN until she can get  tooth abstracted on August 29  not taking a probiotic  3) Patient is taking her medications as prescribed and notes no adverse effects.  Home BP readings have NOT been done except when she goes to her sister's house and have been 145 to 150 systolic . She has started adding chia seeds to every meal .  She is avoiding added salt in her diet and walking regularly about 6 days per week for exercise    4) . Obesity:  has lost 10 lbs since last visit.    Outpatient Medications Prior to Visit  Medication Sig Dispense Refill   penicillin v potassium (VEETID) 500 MG tablet Take 500 mg by mouth every 6 (six) hours.     aspirin  EC 81 MG tablet Take 81 mg by mouth daily. Swallow whole.     atorvastatin  (LIPITOR) 40 MG tablet TAKE 1 TABLET DAILY 90 tablet 3   Blood Glucose Monitoring Suppl (ONETOUCH VERIO FLEX SYSTEM) w/Device KIT Use to check  blood sugar up to TID. 1 kit 3   Cholecalciferol (VITAMIN D) 50 MCG (2000 UT) CAPS Take 1,000 Units by mouth daily.      glipiZIDE  (GLUCOTROL ) 10 MG tablet Take 1 tablet (10 mg total) by mouth 2 (two) times daily before a meal. 180 tablet 3   glucose blood (ONETOUCH ULTRA) test strip Use to check blood sugars three times daily 300 strip 1   JANUVIA  100 MG tablet TAKE 1 TABLET DAILY 90 tablet 3   Lancets (ONETOUCH ULTRASOFT) lancets Use as instructed to check sugars up to TID 300 each 3   losartan -hydrochlorothiazide (HYZAAR) 100-25 MG tablet TAKE 1 TABLET DAILY 90 tablet 3   pioglitazone  (ACTOS ) 15 MG tablet TAKE 1 TABLET DAILY 90 tablet 0   amLODipine  (NORVASC ) 2.5 MG tablet Take 1 tablet (2.5 mg total) by mouth at bedtime. 30 tablet 0   empagliflozin  (JARDIANCE ) 10 MG TABS tablet Take 1 tablet (10 mg total) by mouth daily before breakfast. (Patient not taking: Reported on 12/09/2023) 30 tablet 1   No facility-administered medications prior to visit.    Review of Systems;  Patient denies headache, fevers, malaise, unintentional weight loss, skin rash, eye pain, sinus congestion and sinus pain, sore throat, dysphagia,  hemoptysis , cough, dyspnea,  wheezing, chest pain, palpitations, orthopnea, edema, abdominal pain, nausea, melena, diarrhea, constipation, flank pain, dysuria, hematuria, urinary  Frequency, nocturia, numbness, tingling, seizures,  Focal weakness, Loss of consciousness,  Tremor, insomnia, depression, anxiety, and suicidal ideation.      Objective:  BP 118/62 (BP Location: Left Arm, Patient Position: Sitting, Cuff Size: Normal)   Pulse 79   Temp 98 F (36.7 C) (Oral)   Ht 5' 2 (1.575 m)   Wt 233 lb 9.6 oz (106 kg)   SpO2 96%   BMI 42.73 kg/m   BP Readings from Last 3 Encounters:  12/09/23 118/62  08/29/23 (!) 154/86  03/15/23 (!) 170/93    Wt Readings from Last 3 Encounters:  12/09/23 233 lb 9.6 oz (106 kg)  08/29/23 243 lb 6.4 oz (110.4 kg)  03/15/23 243 lb 12.8  oz (110.6 kg)    Physical Exam Vitals reviewed.  Constitutional:      General: She is not in acute distress.    Appearance: Normal appearance. She is normal weight. She is not ill-appearing, toxic-appearing or diaphoretic.  HENT:     Head: Normocephalic.  Eyes:     General: No scleral icterus.       Right eye: No discharge.        Left eye: No discharge.     Conjunctiva/sclera: Conjunctivae normal.  Cardiovascular:     Rate and Rhythm: Normal rate and regular rhythm.     Heart sounds: Normal heart sounds.  Pulmonary:     Effort: Pulmonary effort is normal. No respiratory distress.     Breath sounds: Normal breath sounds.  Musculoskeletal:        General: Normal range of motion.  Skin:    General: Skin is warm and dry.  Neurological:     General: No focal deficit present.     Mental Status: She is alert and oriented to person, place, and time. Mental status is at baseline.  Psychiatric:        Mood and Affect: Mood normal.        Behavior: Behavior normal.        Thought Content: Thought content normal.        Judgment: Judgment normal.     Lab Results  Component Value Date   HGBA1C 6.3 (A) 12/09/2023   HGBA1C 7.6 (A) 08/29/2023   HGBA1C 7.0 (H) 11/26/2022    Lab Results  Component Value Date   CREATININE 0.83 08/29/2023   CREATININE 0.76 11/26/2022   CREATININE 0.78 06/28/2022    Lab Results  Component Value Date   WBC 5.4 08/29/2023   HGB 14.0 08/29/2023   HCT 42.0 08/29/2023   PLT 268.0 08/29/2023   GLUCOSE 175 (H) 08/29/2023   CHOL 129 08/29/2023   TRIG 67.0 08/29/2023   HDL 37.60 (L) 08/29/2023   LDLDIRECT 86.0 08/29/2023   LDLCALC 78 08/29/2023   ALT 50 (H) 08/29/2023   AST 41 (H) 08/29/2023   NA 139 08/29/2023   K 4.4 08/29/2023   CL 104 08/29/2023   CREATININE 0.83 08/29/2023   BUN 11 08/29/2023   CO2 30 08/29/2023   TSH 1.00 08/29/2023   HGBA1C 6.3 (A) 12/09/2023   MICROALBUR 1.2 08/29/2023    No results found.  Assessment & Plan:   .Obesity, diabetes, and hypertension syndrome (HCC) Assessment & Plan: Rybelsus  ws  stopped  due to constipation .  She did not tolerate jardiance  due to vaginitis  she has brought her diabetes under control with  Januvia ,  Actos  and glipizide  and has made improvements in following a lo GI diet and daily exercise. No changes today   Lab Results  Component Value Date   HGBA1C 6.3 (A) 12/09/2023     Orders: -     POCT glycosylated hemoglobin (Hb A1C)  Hyperlipidemia associated with type 2 diabetes mellitus (HCC)  Elevated liver enzymes Assessment & Plan: Recurrent   ruling out Viral hepatitis  again.   Ultrasound normal 2020   Lab Results  Component Value Date   ALT 50 (H) 08/29/2023   AST 41 (H) 08/29/2023   ALKPHOS 95 08/29/2023   BILITOT 0.6 08/29/2023     Orders: -     Comprehensive metabolic panel with GFR -     Hepatitis C antibody  Tubular adenoma of colon Assessment & Plan: Annual follow up was recommended last year.  Referral made    Tobacco abuse counseling Assessment & Plan: Spent 3 minutes discussing risk of continued tobacco abuse, including but not limited to CAD, PAD, hypertension, and CA.  She is not interested in pharmacotherapy at this time.Smoking cessation instruction/counseling given: commended patient for reducing daily use. And encouraged  Patient to continue reduction in daily use by 1 cigarette every week   Tobacco abuse Assessment & Plan: he patient was asked about her current tobacco use, congratulate  her reduction in use,  counselled on the long term risks of tobacco use, and was encouraged to start lung cancer screening .  Referral t pulmonary clinic was previously made In Feb 2025 but patient has declined services    Other orders -     amLODIPine  Besylate; Take 1 tablet (2.5 mg total) by mouth at bedtime.  Dispense: 90 tablet; Refill: 1     I spent 34 minutes on the day of this face to face encounter reviewing patient's  most recent  visit with cardiology,  nephrology,  and neurology,  prior relevant surgical and non surgical procedures, recent  labs and imaging studies, counseling on weight management,  reviewing the assessment and plan with patient, and post visit ordering and reviewing of  diagnostics and therapeutics with patient  .   Follow-up: Return in about 3 months (around 03/10/2024) for physical.   Verneita LITTIE Kettering, MD

## 2023-12-09 NOTE — Patient Instructions (Addendum)
 1) You are taking penicillin for your tooth.  Taking an antibiotic can create an imbalance in the normal population of bacteria that live in the small intestine.  This imbalance can persist for 3 months.   Taking a probiotic ( Align, Floraque or Culturelle), the generic version of one of these over the counter medications, or an alternative form (kombucha,  sauerkraut,  Yogurt, or another dietary source) for a minimum of 3 weeks may help prevent a serious antibiotic associated diarrhea  Called clostridium dificile colitis that occurs when the bacteria population is altered .  Taking a probiotic may also prevent vaginitis due to yeast infections and can be continued indefinitely if you feel that it improves your digestion or your elimination (bowels).      2)CONGRATULATIONS!!!!  YOU HAVE BROUGHT YOUR A1C DOWN FROM 7.6 TO 6.3!!!      YOU ARE NOW UNDER EXCELLENT CONTROL!    Consider making  FRITTATA  WEEKLY  to make your breakfasts more interesting   3) Your liver enzymes were high last April.  If they are still elevated today,  further workup will be needed  4) please schedule your PHYSICAL so we can do your PAP smear

## 2023-12-09 NOTE — Assessment & Plan Note (Signed)
 Annual follow up was recommended last year.  Referral made

## 2023-12-09 NOTE — Assessment & Plan Note (Signed)
 Recurrent   ruling out Viral hepatitis  again.   Ultrasound normal 2020   Lab Results  Component Value Date   ALT 50 (H) 08/29/2023   AST 41 (H) 08/29/2023   ALKPHOS 95 08/29/2023   BILITOT 0.6 08/29/2023

## 2023-12-09 NOTE — Assessment & Plan Note (Addendum)
 he patient was asked about her current tobacco use, congratulate  her reduction in use,  counselled on the long term risks of tobacco use, and was encouraged to start lung cancer screening .  Referral t pulmonary clinic was previously made In Feb 2025 but patient has declined services

## 2023-12-09 NOTE — Assessment & Plan Note (Signed)
 Rybelsus  ws  stopped  due to constipation .  She did not tolerate jardiance  due to vaginitis  she has brought her diabetes under control with  Januvia , Actos  and glipizide  and has made improvements in following a lo GI diet and daily exercise. No changes today   Lab Results  Component Value Date   HGBA1C 6.3 (A) 12/09/2023

## 2023-12-09 NOTE — Assessment & Plan Note (Signed)
Spent 3 minutes discussing risk of continued tobacco abuse, including but not limited to CAD, PAD, hypertension, and CA.  She is not interested in pharmacotherapy at this time.Smoking cessation instruction/counseling given: commended patient for reducing daily use. And encouraged  Patient to continue reduction in daily use by 1 cigarette every week 

## 2023-12-10 LAB — HEPATITIS C ANTIBODY: Hepatitis C Ab: NONREACTIVE

## 2024-02-12 ENCOUNTER — Other Ambulatory Visit: Payer: Self-pay | Admitting: Internal Medicine

## 2024-02-17 ENCOUNTER — Other Ambulatory Visit: Payer: Self-pay | Admitting: Internal Medicine

## 2024-02-17 DIAGNOSIS — Z1231 Encounter for screening mammogram for malignant neoplasm of breast: Secondary | ICD-10-CM

## 2024-02-21 ENCOUNTER — Other Ambulatory Visit: Payer: Self-pay | Admitting: Internal Medicine

## 2024-02-21 DIAGNOSIS — E1165 Type 2 diabetes mellitus with hyperglycemia: Secondary | ICD-10-CM

## 2024-03-17 ENCOUNTER — Ambulatory Visit
Admission: RE | Admit: 2024-03-17 | Discharge: 2024-03-17 | Disposition: A | Discharge status: Home / Self Care | Source: Ambulatory Visit | Attending: Internal Medicine | Admitting: Internal Medicine

## 2024-03-17 DIAGNOSIS — Z1231 Encounter for screening mammogram for malignant neoplasm of breast: Secondary | ICD-10-CM | POA: Diagnosis not present

## 2024-03-20 ENCOUNTER — Other Ambulatory Visit (HOSPITAL_COMMUNITY)
Admission: RE | Admit: 2024-03-20 | Discharge: 2024-03-20 | Disposition: A | Discharge status: Home / Self Care | Source: Ambulatory Visit | Attending: Internal Medicine | Admitting: Internal Medicine

## 2024-03-20 ENCOUNTER — Ambulatory Visit: Admitting: Internal Medicine

## 2024-03-20 ENCOUNTER — Encounter: Payer: Self-pay | Admitting: Internal Medicine

## 2024-03-20 VITALS — BP 130/70 | HR 80 | Temp 97.9°F | Ht 62.0 in | Wt 238.6 lb

## 2024-03-20 DIAGNOSIS — Z23 Encounter for immunization: Secondary | ICD-10-CM | POA: Diagnosis not present

## 2024-03-20 DIAGNOSIS — E119 Type 2 diabetes mellitus without complications: Secondary | ICD-10-CM | POA: Diagnosis not present

## 2024-03-20 DIAGNOSIS — E1169 Type 2 diabetes mellitus with other specified complication: Secondary | ICD-10-CM | POA: Diagnosis not present

## 2024-03-20 DIAGNOSIS — Z0000 Encounter for general adult medical examination without abnormal findings: Secondary | ICD-10-CM | POA: Diagnosis not present

## 2024-03-20 DIAGNOSIS — E785 Hyperlipidemia, unspecified: Secondary | ICD-10-CM

## 2024-03-20 DIAGNOSIS — Z1231 Encounter for screening mammogram for malignant neoplasm of breast: Secondary | ICD-10-CM

## 2024-03-20 DIAGNOSIS — I10 Essential (primary) hypertension: Secondary | ICD-10-CM

## 2024-03-20 DIAGNOSIS — D126 Benign neoplasm of colon, unspecified: Secondary | ICD-10-CM | POA: Diagnosis not present

## 2024-03-20 DIAGNOSIS — Z124 Encounter for screening for malignant neoplasm of cervix: Secondary | ICD-10-CM

## 2024-03-20 DIAGNOSIS — E7800 Pure hypercholesterolemia, unspecified: Secondary | ICD-10-CM | POA: Diagnosis not present

## 2024-03-20 DIAGNOSIS — J301 Allergic rhinitis due to pollen: Secondary | ICD-10-CM | POA: Diagnosis not present

## 2024-03-20 DIAGNOSIS — Z7984 Long term (current) use of oral hypoglycemic drugs: Secondary | ICD-10-CM

## 2024-03-20 DIAGNOSIS — Z1211 Encounter for screening for malignant neoplasm of colon: Secondary | ICD-10-CM

## 2024-03-20 LAB — LIPID PANEL
Cholesterol: 137 mg/dL (ref 0–200)
HDL: 49.9 mg/dL (ref >39.00)
LDL Cholesterol: 76 mg/dL (ref 0–99)
NonHDL: 87.04
Total CHOL/HDL Ratio: 3
Triglycerides: 54 mg/dL (ref 0.0–149.0)
VLDL: 10.8 mg/dL (ref 0.0–40.0)

## 2024-03-20 LAB — HEMOGLOBIN A1C: Hgb A1c MFr Bld: 6.6 % — ABNORMAL HIGH (ref 4.6–6.5)

## 2024-03-20 LAB — COMPREHENSIVE METABOLIC PANEL WITH GFR
ALT: 26 U/L (ref 0–35)
AST: 26 U/L (ref 0–37)
Albumin: 4 g/dL (ref 3.5–5.2)
Alkaline Phosphatase: 111 U/L (ref 39–117)
BUN: 10 mg/dL (ref 6–23)
CO2: 29 meq/L (ref 19–32)
Calcium: 9.6 mg/dL (ref 8.4–10.5)
Chloride: 104 meq/L (ref 96–112)
Creatinine, Ser: 0.7 mg/dL (ref 0.40–1.20)
GFR: 91.27 mL/min (ref >60.00)
Glucose, Bld: 128 mg/dL — ABNORMAL HIGH (ref 70–99)
Potassium: 4.6 meq/L (ref 3.5–5.1)
Sodium: 141 meq/L (ref 135–145)
Total Bilirubin: 0.7 mg/dL (ref 0.2–1.2)
Total Protein: 7.3 g/dL (ref 6.0–8.3)

## 2024-03-20 LAB — LDL CHOLESTEROL, DIRECT: Direct LDL: 77 mg/dL

## 2024-03-20 MED ORDER — ATORVASTATIN CALCIUM 40 MG PO TABS: 40.0000 mg | ORAL_TABLET | Freq: Every day | ORAL | 3 refills | Status: DC

## 2024-03-20 MED ORDER — AMLODIPINE BESYLATE 2.5 MG PO TABS: 2.5000 mg | ORAL_TABLET | Freq: Every day | ORAL | 1 refills | Status: AC

## 2024-03-20 MED ORDER — LOSARTAN POTASSIUM-HCTZ 100-25 MG PO TABS: 1.0000 | ORAL_TABLET | Freq: Every day | ORAL | 3 refills | Status: AC

## 2024-03-20 MED ORDER — GLIPIZIDE 10 MG PO TABS: ORAL_TABLET | ORAL | 1 refills | Status: DC

## 2024-03-20 NOTE — Progress Notes (Unsigned)
 Patient ID: Katelyn Proctor, female    DOB: 05-30-1960  Age: 63 y.o. MRN: [id number]  The patient is here for annual preventive examination and management of other chronic and acute problems.   The risk factors are reflected in the social history.   The roster of all physicians providing medical care to patient - is listed in the Snapshot section of the chart.   Activities of daily living:  The patient is 100% independent in all ADLs: dressing, toileting, feeding as well as independent mobility   Home safety : The patient has smoke detectors in the home. They wear seatbelts.  There are no unsecured firearms at home. There is no violence in the home.    There is no risks for hepatitis, STDs or HIV. There is no   history of blood transfusion. They have no travel history to infectious disease endemic areas of the world.   The patient has seen their dentist in the last six month. They have seen their eye doctor in the last year. The patinet  denies slight hearing difficulty with regard to whispered voices and some television programs.  They have deferred audiologic testing in the last year.  They do not  have excessive sun exposure. Discussed the need for sun protection: hats, long sleeves and use of sunscreen if there is significant sun exposure.    Diet: the importance of a healthy diet is discussed. They do have a healthy diet.  3 The benefits of regular aerobic exercise were discussed. The patient  is walking 3 days per week  for  30 minutes.    Depression screen: there are no signs or vegative symptoms of depression- irritability, change in appetite, anhedonia, sadness/tearfullness.   The following portions of the patient's history were reviewed and updated as appropriate: allergies, current medications, past family history, past medical history,  past surgical history, past social history  and problem list.   Visual acuity was not assessed per patient preference since the patient has regular  follow up with an  ophthalmologist. Hearing and body mass index were assessed and reviewed.    During the course of the visit the patient was educated and counseled about appropriate screening and preventive services including : fall prevention , diabetes screening, nutrition counseling, colorectal cancer screening, and recommended immunizations.    Chief Complaint:  Cough, rhinitis,  conjuncitivitis,  STARTED a few weeks ago.  Not taking any anthistamines or steroid nasal spRAYS   Review of Symptoms  Patient denies headache, fevers, malaise, unintentional weight loss, skin rash, eye pain, sinus congestion and sinus pain, sore throat, dysphagia,  hemoptysis , dyspnea, wheezing, chest pain, palpitations, orthopnea, edema, abdominal pain, nausea, melena, diarrhea, constipation, flank pain, dysuria, hematuria, urinary  Frequency, nocturia, numbness, tingling, seizures,  Focal weakness, Loss of consciousness,  Tremor, insomnia, depression, anxiety, and suicidal ideation.    Physical Exam:  BP 130/70   Pulse 80   Temp 97.9 F (36.6 C) (Oral)   Ht 5' 2 (1.575 m)   Wt 238 lb 9.6 oz (108.2 kg)   SpO2 96%   BMI 43.64 kg/m    Physical Exam Vitals reviewed.  Constitutional:      General: She is not in acute distress.    Appearance: Normal appearance. She is well-developed and normal weight. She is not ill-appearing, toxic-appearing or diaphoretic.  HENT:     Head: Normocephalic.     Right Ear: Tympanic membrane, ear canal and external ear normal. There is no impacted cerumen.  Left Ear: Tympanic membrane, ear canal and external ear normal. There is no impacted cerumen.     Nose: Nose normal.     Mouth/Throat:     Mouth: Mucous membranes are moist.     Pharynx: Oropharynx is clear.  Eyes:     General: No scleral icterus.       Right eye: No discharge.        Left eye: No discharge.     Conjunctiva/sclera: Conjunctivae normal.     Pupils: Pupils are equal, round, and reactive to  light.  Neck:     Thyroid : No thyromegaly.     Vascular: No carotid bruit or JVD.  Cardiovascular:     Rate and Rhythm: Normal rate and regular rhythm.     Heart sounds: Normal heart sounds.  Pulmonary:     Effort: Pulmonary effort is normal. No respiratory distress.     Breath sounds: Normal breath sounds.  Chest:  Breasts:    Breasts are symmetrical.     Right: Normal. No swelling, inverted nipple, mass, nipple discharge, skin change or tenderness.     Left: Normal. No swelling, inverted nipple, mass, nipple discharge, skin change or tenderness.  Abdominal:     General: Bowel sounds are normal.     Palpations: Abdomen is soft. There is no mass.     Tenderness: There is no abdominal tenderness. There is no guarding or rebound.     Hernia: There is no hernia in the left inguinal area or right inguinal area.  Genitourinary:    General: Normal vulva.     Exam position: Lithotomy position.     Pubic Area: No rash or pubic lice.      Labia:        Right: No rash, tenderness, lesion or injury.        Left: No rash, tenderness, lesion or injury.      Vagina: Normal.     Cervix: Normal.     Uterus: Normal.      Adnexa: Right adnexa normal and left adnexa normal.  Musculoskeletal:        General: Normal range of motion.     Cervical back: Normal range of motion and neck supple.  Lymphadenopathy:     Cervical: No cervical adenopathy.     Upper Body:     Right upper body: No supraclavicular, axillary or pectoral adenopathy.     Left upper body: No supraclavicular, axillary or pectoral adenopathy.     Lower Body: No right inguinal adenopathy. No left inguinal adenopathy.  Skin:    General: Skin is warm and dry.  Neurological:     General: No focal deficit present.     Mental Status: She is alert and oriented to person, place, and time. Mental status is at baseline.  Psychiatric:        Mood and Affect: Mood normal.        Behavior: Behavior normal.        Thought Content: Thought  content normal.        Judgment: Judgment normal.     Assessment and Plan: Cervical cancer screening -     Cytology - PAP -     Cytology - PAP  Pure hypercholesterolemia -     Atorvastatin  Calcium ; Take 1 tablet (40 mg total) by mouth daily.  Dispense: 90 tablet; Refill: 3 -     Lipid panel -     LDL cholesterol, direct  Essential hypertension -  Losartan  Potassium-HCTZ; Take 1 tablet by mouth daily.  Dispense: 90 tablet; Refill: 3  Colon cancer screening  Encounter for screening mammogram for malignant neoplasm of breast  Obesity, diabetes, and hypertension syndrome (HCC) Assessment & Plan: Rybelsus  ws  stopped  due to constipation .  She did not tolerate jardiance  due to vaginitis  she has brought her diabetes under control with  Januvia , Actos  and glipizide  and has made improvements in following a lowGI diet and daily exercise. No changes today.  Continue ARB for hypertension; she has no proteinuria    Lab Results  Component Value Date   HGBA1C 6.6 (H) 03/20/2024   Lab Results  Component Value Date   MICROALBUR 1.2 08/29/2023        Orders: -     Comprehensive metabolic panel with GFR -     Hemoglobin A1c  Need for tetanus, diphtheria, and acellular pertussis (Tdap) vaccine -     Tdap vaccine greater than or equal to 7yo IM  Pap smear for cervical cancer screening Assessment & Plan: Repeated  today with considerable difficulty due to vaginal stenosis .    Obesity, morbid, BMI 40.0-49.9 (HCC) Assessment & Plan: Encouraged to continue limiting her carbodydrates and fat and to increase her exercise    Hyperlipidemia associated with type 2 diabetes mellitus (HCC) Assessment & Plan: LDL is closer to goal  on atorvastatin   40 mg daily .    She has  no side effects and liver enzymes are normal .     Lab Results  Component Value Date   CHOL 137 03/20/2024   HDL 49.90 03/20/2024   LDLCALC 76 03/20/2024   LDLDIRECT 77.0 03/20/2024   TRIG 54.0 03/20/2024    CHOLHDL 3 03/20/2024   Lab Results  Component Value Date   ALT 26 03/20/2024   AST 26 03/20/2024   ALKPHOS 111 03/20/2024   BILITOT 0.7 03/20/2024      Seasonal allergic rhinitis due to pollen Assessment & Plan: Advised to take an anthistamine daily and use afrin for 3 days to manage congestion   Encounter for preventive health examination Assessment & Plan: age appropriate education and counseling updated, referrals for preventative services and immunizations addressed, dietary and smoking counseling addressed, most recent labs reviewed.  I have personally reviewed and have noted:   1) the patient's medical and social history 2) The pt's use of alcohol, tobacco, and illicit drugs 3) The patient's current medications and supplements 4) Functional ability including ADL's, fall risk, home safety risk, hearing and visual impairment 5) Diet and physical activities 6) Evidence for depression or mood disorder 7) The patient's height, weight, and BMI have been recorded in the chart   I have made referrals, and provided counseling and education based on review of the above    Tubular adenoma of colon Assessment & Plan: Annual follow up was recommended  in 2024 due to the size of the polyp removed.  She has been contacted but has nt completed the paperwork    Other orders -     amLODIPine  Besylate; Take 1 tablet (2.5 mg total) by mouth at bedtime.  Dispense: 90 tablet; Refill: 1 -     glipiZIDE ; TAKE 1 TABLET TWICE A DAY BEFORE MEALS  Dispense: 180 tablet; Refill: 1    Return in about 3 months (around 06/20/2024) for follow up diabetes.  Verneita LITTIE Kettering, MD

## 2024-03-20 NOTE — Patient Instructions (Addendum)
 You should start taking Generic allegra once or twice daily  (fexofenadine is the active ingredient) for your allergies  You can use afrin nasal spray (oxymetazoline ) for congestion NOT MORE THAN 3 DAYS IN A ROW  NYSTATIN ANTIFUNGAL POWDER (PRESCRIPTION SENT) FOR THE RASH UNDER BREASTS.  ONCE RESOLVED  SWITCH TO GOLD BOND MEDICATED POWDER WITH ZINC TO USE AFTER BATHS/SHOWERS  GET YOUR COLONSCOPY SCHEDULED    TDAP VACCINE GIVEN TODAY

## 2024-03-21 NOTE — Assessment & Plan Note (Addendum)
 Rybelsus  ws  stopped  due to constipation .  She did not tolerate jardiance  due to vaginitis  she has brought her diabetes under control with  Januvia , Actos  and glipizide  and has made improvements in following a lowGI diet and daily exercise. No changes today.  Continue ARB for hypertension; she has no proteinuria    Lab Results  Component Value Date   HGBA1C 6.6 (H) 03/20/2024   Lab Results  Component Value Date   MICROALBUR 1.2 08/29/2023

## 2024-03-21 NOTE — Assessment & Plan Note (Signed)
 Encouraged to continue limiting her carbodydrates and fat and to increase her exercise

## 2024-03-21 NOTE — Assessment & Plan Note (Signed)
 Annual follow up was recommended  in 2024 due to the size of the polyp removed.  She has been contacted but has nt completed the paperwork

## 2024-03-21 NOTE — Assessment & Plan Note (Signed)
 LDL is closer to goal  on atorvastatin   40 mg daily .    She has  no side effects and liver enzymes are normal .     Lab Results  Component Value Date   CHOL 137 03/20/2024   HDL 49.90 03/20/2024   LDLCALC 76 03/20/2024   LDLDIRECT 77.0 03/20/2024   TRIG 54.0 03/20/2024   CHOLHDL 3 03/20/2024   Lab Results  Component Value Date   ALT 26 03/20/2024   AST 26 03/20/2024   ALKPHOS 111 03/20/2024   BILITOT 0.7 03/20/2024

## 2024-03-21 NOTE — Assessment & Plan Note (Signed)
 Advised to take an anthistamine daily and use afrin for 3 days to manage congestion

## 2024-03-21 NOTE — Assessment & Plan Note (Signed)

## 2024-03-21 NOTE — Assessment & Plan Note (Signed)
 Repeated  today with considerable difficulty due to vaginal stenosis .

## 2024-03-22 ENCOUNTER — Ambulatory Visit: Payer: Self-pay | Admitting: Internal Medicine

## 2024-03-23 LAB — CYTOLOGY - PAP
Adequacy: ABSENT
Comment: NEGATIVE
Diagnosis: NEGATIVE
High risk HPV: NEGATIVE

## 2024-05-04 ENCOUNTER — Other Ambulatory Visit: Payer: Self-pay | Admitting: Internal Medicine

## 2024-05-04 DIAGNOSIS — E7800 Pure hypercholesterolemia, unspecified: Secondary | ICD-10-CM

## 2024-05-12 ENCOUNTER — Other Ambulatory Visit: Payer: Self-pay | Admitting: Internal Medicine

## 2024-07-03 ENCOUNTER — Ambulatory Visit: Admitting: Internal Medicine
